# Patient Record
Sex: Female | Born: 1970 | Race: Black or African American | Hispanic: No | Marital: Single | Smoking: Current every day smoker
Health system: Southern US, Community
[De-identification: ages and names within clinical notes are randomized; demographics above are authoritative.]

## PROBLEM LIST (undated history)

## (undated) DIAGNOSIS — E119 Type 2 diabetes mellitus without complications: Secondary | ICD-10-CM

## (undated) HISTORY — PX: OTHER SURGICAL HISTORY: SHX169

---

## 2015-10-13 ENCOUNTER — Emergency Department (HOSPITAL_COMMUNITY): Payer: Medicaid Other

## 2015-10-13 ENCOUNTER — Emergency Department (HOSPITAL_COMMUNITY)
Admission: EM | Admit: 2015-10-13 | Discharge: 2015-10-13 | Disposition: A | Discharge status: Home / Self Care | Payer: Medicaid Other | Attending: Emergency Medicine | Admitting: Emergency Medicine

## 2015-10-13 ENCOUNTER — Encounter (HOSPITAL_COMMUNITY): Payer: Self-pay | Admitting: *Deleted

## 2015-10-13 DIAGNOSIS — E119 Type 2 diabetes mellitus without complications: Secondary | ICD-10-CM | POA: Insufficient documentation

## 2015-10-13 DIAGNOSIS — F17200 Nicotine dependence, unspecified, uncomplicated: Secondary | ICD-10-CM | POA: Insufficient documentation

## 2015-10-13 DIAGNOSIS — R079 Chest pain, unspecified: Secondary | ICD-10-CM | POA: Diagnosis present

## 2015-10-13 HISTORY — DX: Type 2 diabetes mellitus without complications: E11.9

## 2015-10-13 LAB — CBC
HCT: 36.7 % (ref 36.0–46.0)
Hemoglobin: 12 g/dL (ref 12.0–15.0)
MCH: 29.2 pg (ref 26.0–34.0)
MCHC: 32.7 g/dL (ref 30.0–36.0)
MCV: 89.3 fL (ref 78.0–100.0)
PLATELETS: 246 10*3/uL (ref 150–400)
RBC: 4.11 MIL/uL (ref 3.87–5.11)
RDW: 15.7 % — AB (ref 11.5–15.5)
WBC: 12.9 10*3/uL — ABNORMAL HIGH (ref 4.0–10.5)

## 2015-10-13 LAB — I-STAT TROPONIN, ED
TROPONIN I, POC: 0 ng/mL (ref 0.00–0.08)
Troponin i, poc: 0 ng/mL (ref 0.00–0.08)

## 2015-10-13 LAB — BASIC METABOLIC PANEL
Anion gap: 5 (ref 5–15)
BUN: 8 mg/dL (ref 6–20)
CALCIUM: 8.8 mg/dL — AB (ref 8.9–10.3)
CO2: 26 mmol/L (ref 22–32)
CREATININE: 0.86 mg/dL (ref 0.44–1.00)
Chloride: 105 mmol/L (ref 101–111)
GFR calc Af Amer: >60 mL/min (ref >60)
GLUCOSE: 94 mg/dL (ref 65–99)
POTASSIUM: 3.5 mmol/L (ref 3.5–5.1)
SODIUM: 136 mmol/L (ref 135–145)

## 2015-10-13 MED ORDER — ALBUTEROL SULFATE HFA 108 (90 BASE) MCG/ACT IN AERS
1.0000 | INHALATION_SPRAY | Freq: Four times a day (QID) | RESPIRATORY_TRACT | Status: DC | PRN
  Administered 2015-10-13: 2 via RESPIRATORY_TRACT
  Filled 2015-10-13: qty 6.7

## 2015-10-13 MED ORDER — ASPIRIN 81 MG PO CHEW
324.0000 mg | CHEWABLE_TABLET | Freq: Once | ORAL | Status: AC
  Administered 2015-10-13: 324 mg via ORAL
  Filled 2015-10-13: qty 4

## 2015-10-13 MED ORDER — GI COCKTAIL ~~LOC~~
30.0000 mL | Freq: Once | ORAL | Status: AC
  Administered 2015-10-13: 30 mL via ORAL
  Filled 2015-10-13: qty 30

## 2015-10-13 NOTE — ED Provider Notes (Signed)
[facility]-EMERGENCY DEPT Provider Note   CSN: [id number] Arrival date & time: 10/13/15  1845     History   Chief Complaint Chief Complaint  Patient presents with  . Chest Pain  . Abdominal Pain    HPI Catherine Bender is a 45 y.o. female.  Pt started having pain in her chest while she was getting dinner at her group home this evening.  The foot was too salty and she it upset her stomach.  Pt is also upset about her treatment by the person at the group home.  She also is upset because they took her asthma inhaler    Chest Pain   This is a new problem. The current episode started less than 1 hour ago. The problem occurs constantly. The pain is mild. The quality of the pain is described as stabbing. The pain does not radiate. Associated symptoms include nausea. Pertinent negatives include no abdominal pain, no fever, no shortness of breath and no vomiting. She has tried nothing for the symptoms. The treatment provided no relief. Risk factors include smoking/tobacco exposure.  Pertinent negatives for past medical history include no DVT, no hypertension, no MI and no PE. Past medical history comments: Pt states she use to have diabetes ubut not any longer    Past Medical History:  Diagnosis Date  . Diabetes mellitus without complication (HCC)     There are no active problems to display for this patient.   Past Surgical History:  Procedure Laterality Date  . umbical hernia repair      OB History    No data available       Home Medications    Prior to Admission medications   Not on File    Family History No family history on file.  Social History Social History  Substance Use Topics  . Smoking status: Current Every Day Smoker    Packs/day: 0.25  . Smokeless tobacco: Never Used  . Alcohol use No     Allergies   Morphine and related   Review of Systems Review of Systems  Constitutional: Negative for fever.  Respiratory: Negative for shortness of breath.     Cardiovascular: Positive for chest pain.  Gastrointestinal: Positive for nausea. Negative for abdominal pain and vomiting.  All other systems reviewed and are negative.    Physical Exam Updated Vital Signs BP 138/94 (BP Location: Right Arm)   Pulse (!) 52   Temp 98.3 F (36.8 C) (Oral)   Resp 18   Ht 5\' 7"  (1.702 m)   Wt 79.4 kg   LMP 09/20/2015   SpO2 100%   BMI 27.41 kg/m   Physical Exam  Constitutional: She appears well-developed and well-nourished. No distress.  HENT:  Head: Normocephalic and atraumatic.  Right Ear: External ear normal.  Left Ear: External ear normal.  Eyes: Conjunctivae are normal. Right eye exhibits no discharge. Left eye exhibits no discharge. No scleral icterus.  Neck: Neck supple. No tracheal deviation present.  Cardiovascular: Normal rate, regular rhythm and intact distal pulses.   Pulmonary/Chest: Effort normal and breath sounds normal. No stridor. No respiratory distress. She has no wheezes. She has no rales.  Abdominal: Soft. Bowel sounds are normal. She exhibits no distension. There is no tenderness. There is no rebound and no guarding.  Musculoskeletal: She exhibits no edema or tenderness.  Neurological: She is alert. She has normal strength. No cranial nerve deficit (no facial droop, extraocular movements intact, no slurred speech) or sensory deficit. She exhibits normal muscle  tone. She displays no seizure activity. Coordination normal.  Skin: Skin is warm and dry. No rash noted. She is not diaphoretic.  Psychiatric: She has a normal mood and affect.  Nursing note and vitals reviewed.    ED Treatments / Results  Labs (all labs ordered are listed, but only abnormal results are displayed) Labs Reviewed  CBC - Abnormal; Notable for the following:       Result Value   WBC 12.9 (*)    RDW 15.7 (*)    All other components within normal limits  BASIC METABOLIC PANEL - Abnormal; Notable for the following:    Calcium 8.8 (*)    All other  components within normal limits  I-STAT TROPOININ, ED  I-STAT TROPOININ, ED    EKG  EKG Interpretation  Date/Time:  Sunday October 13 2015 19:12:16 EDT Ventricular Rate:  65 PR Interval:    QRS Duration: 90 QT Interval:  443 QTC Calculation: 461 R Axis:   40 Text Interpretation:  Sinus rhythm Borderline T wave abnormalities No old tracing to compare Confirmed by Raissa Dam  MD-J, Jaaziah Schulke [phone]) on 10/13/2015 7:27:32 PM       Radiology Dg Chest Portable 1 View  Result Date: 10/13/2015 CLINICAL DATA:  45 year old female with chest pain. EXAM: PORTABLE CHEST 1 VIEW COMPARISON:  None. FINDINGS: Single portable view of the chest demonstrates clear lungs with sharp costophrenic angles. There is no pneumothorax. Top-normal cardiac size. No acute osseous pathology. IMPRESSION: No active disease. Electronically Signed   By: Elgie Collard M.D.   On: 10/13/2015 20:22    Procedures Procedures (including critical care time)  Medications Ordered in ED Medications  albuterol (PROVENTIL HFA;VENTOLIN HFA) 108 (90 Base) MCG/ACT inhaler 1-2 puff (not administered)  aspirin chewable tablet 324 mg (324 mg Oral Given 10/13/15 1945)  gi cocktail (Maalox,Lidocaine,Donnatal) (30 mLs Oral Given 10/13/15 1946)     Initial Impression / Assessment and Plan / ED Course  I have reviewed the triage vital signs and the nursing notes.  Pertinent labs & imaging results that were available during my care of the patient were reviewed by me and considered in my medical decision making (see chart for details).  Clinical Course   I suspect the patient's symptoms are most likely related to her stress this evening. She was upset about not getting the food that she wanted. EKG and serial cardiac enzymes are reassuring. Chest x-ray and other laboratory tests are reassuring. The patient requested an inhaler because the staff at her facility threw away her other one.  Patient's discharge home in stable condition. She is feeling  better and wants to leave.  Final Clinical Impressions(s) / ED Diagnoses   Final diagnoses:  Chest pain, unspecified chest pain type    New Prescriptions New Prescriptions   No medications on file     Linwood Dibbles, MD 10/13/15 2301

## 2015-10-13 NOTE — Discharge Instructions (Signed)
Continue your current medications  Follow up with your doctor

## 2015-10-13 NOTE — ED Triage Notes (Signed)
Pt here via [facility] for chest pain.  EMS states that pt was at her group home and became angry b/c the food had too much salt on the food and she began having chest pain.  Pain increases with palpation.  EKG unremarkable.

## 2016-01-05 ENCOUNTER — Encounter (HOSPITAL_COMMUNITY): Payer: Self-pay | Admitting: *Deleted

## 2016-01-05 ENCOUNTER — Emergency Department (HOSPITAL_COMMUNITY)
Admission: EM | Admit: 2016-01-05 | Discharge: 2016-01-06 | Disposition: A | Discharge status: Other Acute Inpatient Hospital | Payer: Medicaid Other | Attending: Emergency Medicine | Admitting: Emergency Medicine

## 2016-01-05 DIAGNOSIS — Z046 Encounter for general psychiatric examination, requested by authority: Secondary | ICD-10-CM | POA: Diagnosis present

## 2016-01-05 DIAGNOSIS — E119 Type 2 diabetes mellitus without complications: Secondary | ICD-10-CM | POA: Diagnosis not present

## 2016-01-05 DIAGNOSIS — F809 Developmental disorder of speech and language, unspecified: Secondary | ICD-10-CM | POA: Diagnosis not present

## 2016-01-05 DIAGNOSIS — Z79899 Other long term (current) drug therapy: Secondary | ICD-10-CM | POA: Diagnosis not present

## 2016-01-05 DIAGNOSIS — F3164 Bipolar disorder, current episode mixed, severe, with psychotic features: Secondary | ICD-10-CM | POA: Diagnosis not present

## 2016-01-05 DIAGNOSIS — F17200 Nicotine dependence, unspecified, uncomplicated: Secondary | ICD-10-CM | POA: Diagnosis not present

## 2016-01-05 DIAGNOSIS — N898 Other specified noninflammatory disorders of vagina: Secondary | ICD-10-CM | POA: Diagnosis not present

## 2016-01-05 DIAGNOSIS — F3160 Bipolar disorder, current episode mixed, unspecified: Secondary | ICD-10-CM | POA: Diagnosis present

## 2016-01-05 DIAGNOSIS — R4789 Other speech disturbances: Secondary | ICD-10-CM

## 2016-01-05 LAB — RAPID URINE DRUG SCREEN, HOSP PERFORMED
AMPHETAMINES: NOT DETECTED
BENZODIAZEPINES: NOT DETECTED
Barbiturates: NOT DETECTED
COCAINE: NOT DETECTED
Opiates: NOT DETECTED
Tetrahydrocannabinol: NOT DETECTED

## 2016-01-05 LAB — URINALYSIS, ROUTINE W REFLEX MICROSCOPIC
Bilirubin Urine: NEGATIVE
Glucose, UA: NEGATIVE mg/dL
Hgb urine dipstick: NEGATIVE
Ketones, ur: NEGATIVE mg/dL
NITRITE: NEGATIVE
PH: 5.5 (ref 5.0–8.0)
Protein, ur: NEGATIVE mg/dL
SPECIFIC GRAVITY, URINE: 1.007 (ref 1.005–1.030)

## 2016-01-05 LAB — CBC WITH DIFFERENTIAL/PLATELET
BASOS ABS: 0 10*3/uL (ref 0.0–0.1)
BASOS PCT: 0 %
EOS PCT: 1 %
Eosinophils Absolute: 0.1 10*3/uL (ref 0.0–0.7)
HCT: 36.3 % (ref 36.0–46.0)
Hemoglobin: 12.6 g/dL (ref 12.0–15.0)
Lymphocytes Relative: 28 %
Lymphs Abs: 3.5 10*3/uL (ref 0.7–4.0)
MCH: 29.4 pg (ref 26.0–34.0)
MCHC: 34.7 g/dL (ref 30.0–36.0)
MCV: 84.8 fL (ref 78.0–100.0)
MONO ABS: 0.9 10*3/uL (ref 0.1–1.0)
Monocytes Relative: 7 %
NEUTROS ABS: 8 10*3/uL — AB (ref 1.7–7.7)
Neutrophils Relative %: 64 %
PLATELETS: 302 10*3/uL (ref 150–400)
RBC: 4.28 MIL/uL (ref 3.87–5.11)
RDW: 14.4 % (ref 11.5–15.5)
WBC: 12.6 10*3/uL — AB (ref 4.0–10.5)

## 2016-01-05 LAB — COMPREHENSIVE METABOLIC PANEL
ALT: 15 U/L (ref 14–54)
AST: 25 U/L (ref 15–41)
Albumin: 3.5 g/dL (ref 3.5–5.0)
Alkaline Phosphatase: 58 U/L (ref 38–126)
Anion gap: 10 (ref 5–15)
BILIRUBIN TOTAL: 0.8 mg/dL (ref 0.3–1.2)
BUN: 12 mg/dL (ref 6–20)
CHLORIDE: 102 mmol/L (ref 101–111)
CO2: 24 mmol/L (ref 22–32)
Calcium: 8.7 mg/dL — ABNORMAL LOW (ref 8.9–10.3)
Creatinine, Ser: 1.28 mg/dL — ABNORMAL HIGH (ref 0.44–1.00)
GFR, EST AFRICAN AMERICAN: 58 mL/min — AB (ref >60)
GFR, EST NON AFRICAN AMERICAN: 50 mL/min — AB (ref >60)
Glucose, Bld: 96 mg/dL (ref 65–99)
POTASSIUM: 3.3 mmol/L — AB (ref 3.5–5.1)
Sodium: 136 mmol/L (ref 135–145)
TOTAL PROTEIN: 6.8 g/dL (ref 6.5–8.1)

## 2016-01-05 LAB — URINE MICROSCOPIC-ADD ON
BACTERIA UA: NONE SEEN
RBC / HPF: NONE SEEN RBC/hpf (ref 0–5)

## 2016-01-05 LAB — ACETAMINOPHEN LEVEL: Acetaminophen (Tylenol), Serum: <10 ug/mL — ABNORMAL LOW (ref 10–30)

## 2016-01-05 LAB — HCG, QUANTITATIVE, PREGNANCY: hCG, Beta Chain, Quant, S: <1 m[IU]/mL (ref <5)

## 2016-01-05 LAB — SALICYLATE LEVEL: Salicylate Lvl: <7 mg/dL (ref 2.8–30.0)

## 2016-01-05 LAB — ETHANOL

## 2016-01-05 MED ORDER — ZIPRASIDONE MESYLATE 20 MG IM SOLR
20.0000 mg | Freq: Once | INTRAMUSCULAR | Status: AC
  Administered 2016-01-05: 20 mg via INTRAMUSCULAR
  Filled 2016-01-05: qty 20

## 2016-01-05 MED ORDER — ZIPRASIDONE MESYLATE 20 MG IM SOLR
20.0000 mg | Freq: Once | INTRAMUSCULAR | Status: AC
  Administered 2016-01-06: 20 mg via INTRAMUSCULAR
  Filled 2016-01-05: qty 20

## 2016-01-05 MED ORDER — SODIUM CHLORIDE 0.9 % IV BOLUS (SEPSIS): 1000.0000 mL | Freq: Once | INTRAVENOUS | Status: DC

## 2016-01-05 MED ORDER — STERILE WATER FOR INJECTION IJ SOLN
INTRAMUSCULAR | Status: AC
  Administered 2016-01-05: 12:00:00
  Filled 2016-01-05: qty 10

## 2016-01-05 MED ORDER — LORAZEPAM 2 MG/ML IJ SOLN
2.0000 mg | Freq: Once | INTRAMUSCULAR | Status: AC
  Administered 2016-01-05: 2 mg via INTRAMUSCULAR
  Filled 2016-01-05: qty 1

## 2016-01-05 MED ORDER — CLONIDINE HCL 0.1 MG PO TABS: 0.2000 mg | ORAL_TABLET | Freq: Once | ORAL | Status: DC

## 2016-01-05 MED ORDER — DIPHENHYDRAMINE HCL 50 MG/ML IJ SOLN
50.0000 mg | Freq: Once | INTRAMUSCULAR | Status: AC
  Administered 2016-01-05: 50 mg via INTRAMUSCULAR
  Filled 2016-01-05: qty 1

## 2016-01-05 MED ORDER — POTASSIUM CHLORIDE CRYS ER 20 MEQ PO TBCR
40.0000 meq | EXTENDED_RELEASE_TABLET | Freq: Once | ORAL | Status: AC
  Administered 2016-01-05: 40 meq via ORAL
  Filled 2016-01-05 (×2): qty 2

## 2016-01-05 MED ORDER — DIPHENHYDRAMINE HCL 50 MG/ML IJ SOLN
50.0000 mg | Freq: Once | INTRAMUSCULAR | Status: AC
  Administered 2016-01-06: 50 mg via INTRAMUSCULAR
  Filled 2016-01-05: qty 1

## 2016-01-05 NOTE — ED Notes (Signed)
Calmer, still tearful, watching tv, talking about her granddaughter.  Pt agreed to take medication.

## 2016-01-05 NOTE — Progress Notes (Signed)
Patient has been accepted to [facility].  Accepting physician is Dr. Margarita RanaSurya Bender Call report to [phone].  Representative was [facility].  [facility], LCAS-A, LPC-A, [facility]  Counselor 01/05/2016 7:20 PM

## 2016-01-05 NOTE — ED Notes (Addendum)
Marijean NiemannJaime, [place] at bedside,

## 2016-01-05 NOTE — ED Notes (Signed)
Patient noted sleeping in room. No complaints, stable, in no acute distress. Q15 minute rounds and monitoring via Security Cameras to continue.  

## 2016-01-05 NOTE — ED Notes (Signed)
Patient refuses to keep her voice down even though she is made aware of the irritability of other patients.

## 2016-01-05 NOTE — ED Notes (Signed)
Pt. Not responding to redirection related to her disturbance of the milieu by loud commentary in the hall.

## 2016-01-05 NOTE — ED Notes (Signed)
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  Pt then began talking about her family putting her places to get her help.

## 2016-01-05 NOTE — Progress Notes (Signed)
TTS assessment recommends inpatient treatment for pt.  Pt under IVC.   [facility] [place]  Noted UDS results are pending.   Ilean SkillMeghan Josanna Hefel, MSW, LCSW Clinical Social Work, Disposition  01/05/2016 [phone]

## 2016-01-05 NOTE — ED Notes (Signed)
Pt in the bathroom talking loudly

## 2016-01-05 NOTE — ED Notes (Signed)
Patient denies pain and is resting comfortably.  

## 2016-01-05 NOTE — ED Notes (Signed)
Pt up in hall talking, pt agrees take potassium

## 2016-01-05 NOTE — ED Notes (Signed)
Patient refuses to put pants on.  She became anxious and started shouting.  Patient finally put pants on after speaking with her for some time.

## 2016-01-05 NOTE — ED Notes (Signed)
Pt medicated w/o difficulty. Talking loudly, rambling about her grand children, people stealing from her.  PT acknowledged that she does live in a group home, but does not want to return.  Pt reports that she has walking all over town and that she can stay at the [facility].  Pt reassured that she is safe here.

## 2016-01-05 NOTE — ED Notes (Addendum)
Nad, watching tv.  Pt continues to report that she had a purse with her.  Unable to locate in belongings or TCU, ED charge contacted and will look in main ED. Tearful and loud at times

## 2016-01-05 NOTE — ED Notes (Signed)
Additional belongings located in main ed

## 2016-01-05 NOTE — ED Notes (Signed)
Mother asked for a call with disposition at [phone]. Pt. Gives permission to talk to Mom.

## 2016-01-05 NOTE — ED Notes (Signed)
Add pregnancy test.

## 2016-01-05 NOTE — ED Notes (Signed)
Patient noted in hall still hyperverbal. No complaints, stable, in no acute distress. Q15 minute rounds and monitoring via [employer] Cameras to continue.

## 2016-01-05 NOTE — ED Notes (Signed)
Up walking int he hall, smiling laughing talking about wifi and cell phone towers.  PT is aware that we need a urine sample

## 2016-01-05 NOTE — ED Notes (Addendum)
Pt laying in room crying, yelling, "talking to God" unable to redirect reports that she's "praying" and not to interrupt her.  Pt reports that she just wants to" go to her daughters"..  home and "be mute" Pt yelling about her purse, is aware that she purse was located, IVC in progress.

## 2016-01-05 NOTE — ED Notes (Addendum)
Up to the bathroom to shower and change scrubs. After arriving pt left her room and began to cry asking if she could take a shower "have been waiting for 24 hours."

## 2016-01-05 NOTE — ED Notes (Signed)
Message left with [facility] for transport Monday AM.

## 2016-01-05 NOTE — ED Notes (Addendum)
Called [facility] to receive patient. Patient moved to room 37

## 2016-01-05 NOTE — ED Notes (Signed)
Pt. Still manic pacing in hall talking loudly despite requests to reduce her volume.

## 2016-01-05 NOTE — ED Notes (Signed)
Pt. Awake yelling loudly about leaving the hospital.

## 2016-01-05 NOTE — ED Notes (Signed)
Pt to be IVC'd per Dr JShela Commons

## 2016-01-05 NOTE — ED Notes (Signed)
Report to include Situation, Background, Assessment, and Recommendations received from Janie Rambo RN. Patient alert and oriented, warm and dry, in no acute distress. Patient denies SI, HI, AVH and pain. Patient made aware of Q15 minute rounds and security cameras for their safety. Patient instructed to come to me with needs or concerns.  

## 2016-01-05 NOTE — ED Notes (Signed)
Bed: WA30 Expected date:  Expected time:  Means of arrival:  Comments: 

## 2016-01-05 NOTE — Progress Notes (Signed)
Referral submitted to:  [facility], [place], [place], [address], [place], [place], [place], [place], [place], [place], [place], [address] [place], [address], [address], [place], [place], [place], [place], and [facility]  Eldo Umanzor K. Sherlon HandingHarris, LCAS-A, LPC-A, [facility]  Counselor 01/05/2016 5:58 PM

## 2016-01-05 NOTE — ED Notes (Addendum)
Pt ambulatory w/o difficulty from TCU .  Pt wanded by security prior to arrival

## 2016-01-05 NOTE — ED Provider Notes (Signed)
[facility]-EMERGENCY DEPT Provider Note   CSN: [id number] Arrival date & time: 01/05/16  0606     History   Chief Complaint Chief Complaint  Patient presents with  . Psychiatric Evaluation    HPI Catherine Bender is a 45 y.o. female.  The history is provided by the patient and medical records. No language interpreter was used.     Catherine Bender is a 45 y.o. female  with a PMH of DM who presents to the Emergency Department by EMS for concern for patient's welfare. Per [employer], patient was stating she was pregnant and had amniotic fluid leaking. When I went to evaluate the patient, I asked her why she was in the ER today and she stated:  "I don't care to discuss that. I'm a Geneticist, molecularlegal lawyer. I'm Catherine Bender's cousin and don't care to talk" I asked her if there was anything that I could do to help her and she stated: "No I help myself, thank you"   I went to re-evaluate patient and she was more cooperative. She denies SI/HI. She does state that she had amniotic fluid leaking starting early this morning. She knew it was amniotic fluid because of the way it smelled. She states that she was told she couldn't be pregnant anymore and is not pregnant now. When asked what medications she takes, she says lithium only as needed but that "the Shaune PollackLord talks to people and he told me not to take that medicine. He said I don't need that." She denies SI/HI at this time. Denies any visual hallucinations.   Past Medical History:  Diagnosis Date  . Diabetes mellitus without complication (HCC)     There are no active problems to display for this patient.   Past Surgical History:  Procedure Laterality Date  . umbical hernia repair      OB History    No data available       Home Medications    Prior to Admission medications   Medication Sig Start Date End Date Taking? Authorizing Provider  LITHIUM PO Take 1 tablet by mouth once.   Yes Historical Provider, MD    Family History History reviewed. No  pertinent family history.  Social History Social History  Substance Use Topics  . Smoking status: Current Every Day Smoker    Packs/day: 0.25  . Smokeless tobacco: Never Used  . Alcohol use No     Allergies   Morphine and related   Review of Systems Review of Systems  Unable to perform ROS: Psychiatric disorder  Gastrointestinal: Negative for abdominal pain.     Physical Exam Updated Vital Signs BP (!) 146/108 (BP Location: Right Arm)   Pulse 78   Temp 98.2 F (36.8 C) (Oral)   Resp 20   Ht 5\' 5"  (1.651 m)   Wt 79.4 kg   SpO2 100%   BMI 29.12 kg/m   Physical Exam  Constitutional: She is oriented to person, place, and time.  WDWN female covering her face with bedsheet.   HENT:  Head: Normocephalic and atraumatic.  Cardiovascular: Normal rate, regular rhythm and normal heart sounds.   No murmur heard. Pulmonary/Chest: Effort normal and breath sounds normal. No respiratory distress.  Abdominal:  Patient would not let me touch abdomen, therefore abdominal exam deferred at this time.   Musculoskeletal:  Moves all extremities well.  Neurological: She is alert and oriented to person, place, and time.  Skin: Skin is warm and dry.  Nursing note and vitals reviewed.  ED Treatments / Results  Labs (all labs ordered are listed, but only abnormal results are displayed) Labs Reviewed  COMPREHENSIVE METABOLIC PANEL - Abnormal; Notable for the following:       Result Value   Potassium 3.3 (*)    Creatinine, Ser 1.28 (*)    Calcium 8.7 (*)    GFR calc non Af Amer 50 (*)    GFR calc Af Amer 58 (*)    All other components within normal limits  CBC WITH DIFFERENTIAL/PLATELET - Abnormal; Notable for the following:    WBC 12.6 (*)    Neutro Abs 8.0 (*)    All other components within normal limits  ACETAMINOPHEN LEVEL - Abnormal; Notable for the following:    Acetaminophen (Tylenol), Serum <10 (*)    All other components within normal limits  ETHANOL  SALICYLATE  LEVEL  HCG, QUANTITATIVE, PREGNANCY  RAPID URINE DRUG SCREEN, HOSP PERFORMED  URINALYSIS, ROUTINE W REFLEX MICROSCOPIC (NOT AT [facility])    EKG  EKG Interpretation None       Radiology No results found.  Procedures Procedures (including critical care time)  Medications Ordered in ED Medications  potassium chloride SA (K-DUR,KLOR-CON) CR tablet 40 mEq (40 mEq Oral Refused 01/05/16 0945)  cloNIDine (CATAPRES) tablet 0.2 mg (not administered)     Initial Impression / Assessment and Plan / ED Course  I have reviewed the triage vital signs and the nursing notes.  Pertinent labs & imaging results that were available during my care of the patient were reviewed by me and considered in my medical decision making (see chart for details).  Clinical Course    [name] is a 45 y.o. female who presents to ED voluntarily however with [employer] for psychiatric evaluation. On exam, patient is very non-cooperative, hyperverbal with decreased eye contact. She does state that she was suppose to be on lithium but is not compliant. Chart review does not show known psychiatric illness but this is very likely. Pregnancy test negative. Labs reviewed and show creatinine of 1.28. Initially placed 1L fluid bolus however patient is not cooperative enough for IV. Went to re-evaluate patient and she states that she has not had any water in over a week because there is "glass in all the water". Water bottle provided to patient and nursing staff informed to encourage hydration. Lab abnormality likely 2/2 dehydration. She still will not allow me to do abdominal exam.   Medically cleared and dispo per TTS with likely inpatient care.    Final Clinical Impressions(s) / ED Diagnoses   Final diagnoses:  None    New Prescriptions New Prescriptions   No medications on file     [facility] Center[facility]ime Pilcher Keelynn Furgerson, PA-C 01/05/16 [id number]    Lorre NickAnthony Allen, MD 01/05/16 [id number]

## 2016-01-05 NOTE — Progress Notes (Signed)
CSW faxed patient's IVC paperwork to magistrate office. CSW contacted to magistrate office to ensure paperwork was received. Staff reported that paperwork was received and that the magistrate today is Lovell SheehanJenkins. CSW logged IVC and first examination paperwork into IVC log book.

## 2016-01-05 NOTE — ED Triage Notes (Signed)
Patient is alert with confusion.  EMS called by [employer] due to patient well fare.  Patient is stating to EMS that she was pregnant and her amniotic fluid was leaking.  During the encounter with EMS patient made statements as she is michael jackson's cousin and multiple other statements that made no sense and patient was brought in for possible psych issues.

## 2016-01-05 NOTE — BH Assessment (Signed)
Tele Assessment Note *PT'S LABS NOT COMPLETED AT TIME OF TELEASSESSMENT* Pt presents voluntarily to [facility]. Per chart review, EMS was called by [employer] to do welfare check. Pt BIB EMS. Pt is delusional. She states that she is Casimiro Needle Jackson's cousin. 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 Pt denies SI currently or at any time in the past. Pt denies any history of suicide attempts, or of self-mutilation. Pt denies any current or past substance abuse problems. Pt does not appear to be intoxicated or in withdrawal at this time. Pt denies homicidal thoughts or physical aggression. Pt denies having any legal problems at this time. Pt is at first cooperative with Clinical research associate but then pt becomes irritable. Her thought process is tangential and circumstantial. Pt reports her amniotic sac is leaking. When asked about pregnancy, pt sts she doesn't want any family members to know, and she begins naming several relatives. Pt is hyper verbal. She reports she has been inpatient at a psychiatric hospital, but she doesn't report name or date of admission. Pt denies current outpatient treatment. When asked about pt's appetite, pt says, "I want to eat healthy so you won't die." She then states, "I'm done" several times and refuses to speak anymore to Clinical research associate.  Writer called and spoke w [employer] Medical laboratory scientific officer. Simpson. Officer Lodema Hong reports pt told responding officers that a "cartoon spoke to her and told her something's wrong."   Shirah Eberwein is an 45 y.o. female.   Diagnosis: Unspecified Schizophrenia Spectrum or other Psychotic Disorder  Past Medical History:  Past Medical History:  Diagnosis Date  . Diabetes mellitus without complication [facility])     Past Surgical History:  Procedure Laterality Date  . umbical hernia repair      Family History: History reviewed. No pertinent family history.  Social History:  reports that she has been smoking.  She has been smoking about 0.25 packs per day. She has never  used smokeless tobacco. She reports that she does not drink alcohol or use drugs.  Additional Social History:  Alcohol / Drug Use Pain Medications: pt denies abuse - see pta meds list Prescriptions: pt denies abuse - see pta meds list Over the Counter: pt denies abuse - see pta meds list History of alcohol / drug use?: No history of alcohol / drug abuse Longest period of sobriety (when/how long): n/a  CIWA: CIWA-Ar BP: (!) 146/108 Pulse Rate: 78 COWS:    PATIENT STRENGTHS: (choose at least two) Average or above average intelligence Communication skills  Allergies:  Allergies  Allergen Reactions  . Morphine And Related     Home Medications:  (Not in a hospital admission)  OB/GYN Status:  No LMP recorded.  General Assessment Data Location of Assessment: [facility] ED TTS Assessment: Out of system Is this a Tele or Face-to-Face Assessment?: Tele Assessment Is this an Initial Assessment or a Re-assessment for this encounter?: Initial Assessment Marital status:  (unable to assess) Is patient pregnant?: Other (Comment) (labs not back yet) Pregnancy Status: Other (Comment) Living Arrangements:  (boarding house) Can pt return to current living arrangement?: Yes Admission Status: Voluntary Is patient capable of signing voluntary admission?: No Referral Source: Other Insurance type: medicaid     Crisis Care Plan Living Arrangements:  (boarding house) Name of Psychiatrist: none Name of Therapist: none  Education Status Is patient currently in school?: No  Risk to self with the past 6 months Suicidal Ideation: No Has patient been a risk to self within the past 6  months prior to admission? : No Suicidal Intent: No Has patient had any suicidal intent within the past 6 months prior to admission? : No Is patient at risk for suicide?: No Suicidal Plan?: No Has patient had any suicidal plan within the past 6 months prior to admission? : No Access to Means:  (n/a) What has been  your use of drugs/alcohol within the last 12 months?: pt denies use Previous Attempts/Gestures: No How many times?: 0 Other Self Harm Risks: none Triggers for Past Attempts:  (n/a) Intentional Self Injurious Behavior: None Family Suicide History: Unable to assess Recent stressful life event(s):  (unable to assess) Persecutory voices/beliefs?: No Depression:  (unable to assess) Depression Symptoms:  (unable to assess) Substance abuse history and/or treatment for substance abuse?: No Suicide prevention information given to non-admitted patients: Not applicable  Risk to Others within the past 6 months Homicidal Ideation: No Does patient have any lifetime risk of violence toward others beyond the six months prior to admission? : No Thoughts of Harm to Others: No Current Homicidal Intent: No Current Homicidal Plan: No Access to Homicidal Means: No Identified Victim: none History of harm to others?:  (unable to assess) Assessment of Violence: None Noted Violent Behavior Description: unable to assess Does patient have access to weapons?:  (unable to assess) Criminal Charges Pending?: No Does patient have a court date: No Is patient on probation?: Unknown  Psychosis Hallucinations:  (unable to assess) Delusions:  (pt sts she is Medical sales representativemichael jackson's cousin, an attorney & shrink)  Mental Status Report Appearance/Hygiene: Unremarkable, In scrubs (pt has face mask pulled down under her chin) Eye Contact: Fair Motor Activity: Freedom of movement Speech: Logical/coherent, Tangential Level of Consciousness: Alert, Irritable Mood:  (unable to assess) Affect: Labile Anxiety Level: None Thought Processes: Tangential, Circumstantial, Relevant Judgement: Impaired Orientation: Person Obsessive Compulsive Thoughts/Behaviors: Unable to Assess  Cognitive Functioning Concentration: Unable to Assess Memory: Unable to Assess IQ: Average Insight: Poor Impulse Control: Poor Appetite:  (unable  to assess) Sleep: Unable to Assess Vegetative Symptoms: Unable to Assess  ADLScreening [facility] Assessment Services) Patient's cognitive ability adequate to safely complete daily activities?: No Patient able to express need for assistance with ADLs?: Yes Independently performs ADLs?: Yes (appropriate for developmental age)  Prior Inpatient Therapy Prior Inpatient Therapy: Yes Prior Therapy Dates: unknown Prior Therapy Facilty/Provider(s): unknown Reason for Treatment: unknown  Prior Outpatient Therapy Prior Outpatient Therapy: Yes Prior Therapy Dates: unable to assess Prior Therapy Facilty/Provider(s): unable to assess Reason for Treatment: unable to assess Does patient have an ACCT team?: Unknown Does patient have Intensive In-House Services?  : No Does patient have [facility] services? : Unknown Does patient have P4CC services?: Unknown  ADL Screening (condition at time of admission) Patient's cognitive ability adequate to safely complete daily activities?: No Is the patient deaf or have difficulty hearing?: No Does the patient have difficulty seeing, even when wearing glasses/contacts?: No Does the patient have difficulty concentrating, remembering, or making decisions?: Yes Patient able to express need for assistance with ADLs?: Yes Does the patient have difficulty dressing or bathing?: No Independently performs ADLs?: Yes (appropriate for developmental age) Does the patient have difficulty walking or climbing stairs?: No Weakness of Legs: None Weakness of Arms/Hands: None  Home Assistive Devices/Equipment Home Assistive Devices/Equipment: None    Abuse/Neglect Assessment (Assessment to be complete while patient is alone) Physical Abuse:  (unable to assess) Verbal Abuse:  (unable to assess) Sexual Abuse:  (unable to assess) Exploitation of patient/patient's resources:  (unable to assess)  Self-Neglect:  (unable to assess)     Advance Directives (For Healthcare) Does  Patient Have a Medical Advance Directive?: No Would patient like information on creating a medical advance directive?: No - Patient declined    Additional Information 1:1 In Past 12 Months?: No CIRT Risk: No Elopement Risk: Yes Does patient have medical clearance?: Yes     Disposition:  Disposition Initial Assessment Completed for this Encounter: Yes Disposition of Patient: Inpatient treatment program Catha Nottingham(jamison lord DNP rec inpatient treatment)  Darus Hershman P 01/05/2016 9:15 AM

## 2016-01-05 NOTE — ED Notes (Signed)
Patient noted in hall. No complaints, stable, in no acute distress. Q15 minute rounds and monitoring via Security Cameras to continue. 

## 2016-01-06 MED ORDER — STERILE WATER FOR INJECTION IJ SOLN
INTRAMUSCULAR | Status: AC
  Administered 2016-01-06: 1 mL
  Filled 2016-01-06: qty 10

## 2016-01-06 MED ORDER — ASENAPINE MALEATE 5 MG SL SUBL
10.0000 mg | SUBLINGUAL_TABLET | Freq: Two times a day (BID) | SUBLINGUAL | Status: DC
  Administered 2016-01-06: 10 mg via SUBLINGUAL
  Filled 2016-01-06: qty 2

## 2016-01-06 MED ORDER — LORAZEPAM 1 MG PO TABS
2.0000 mg | ORAL_TABLET | Freq: Once | ORAL | Status: AC
  Administered 2016-01-06: 2 mg via ORAL
  Filled 2016-01-06: qty 2

## 2016-01-06 MED ORDER — LORAZEPAM 1 MG PO TABS: 1.0000 mg | ORAL_TABLET | ORAL | Status: DC | PRN

## 2016-01-06 NOTE — ED Notes (Signed)
Patient noted sleeping in room. No complaints, stable, in no acute distress. Q15 minute rounds and monitoring via Security Cameras to continue.  

## 2016-01-06 NOTE — ED Notes (Signed)
Pt. C/o anxiety and crying.

## 2016-01-06 NOTE — ED Notes (Signed)
[facility].  [place]  [place] [facility]  All belongings given to the transport team.

## 2016-01-06 NOTE — ED Notes (Signed)
Patient noted in rest room. No complaints, stable, in no acute distress. Q15 minute rounds and monitoring via Security Cameras to continue.  

## 2016-03-11 ENCOUNTER — Emergency Department (HOSPITAL_COMMUNITY)
Admission: EM | Admit: 2016-03-11 | Discharge: 2016-03-11 | Disposition: A | Discharge status: Other Acute Inpatient Hospital | Payer: Medicaid Other | Attending: Emergency Medicine | Admitting: Emergency Medicine

## 2016-03-11 ENCOUNTER — Encounter (HOSPITAL_COMMUNITY): Payer: Self-pay | Admitting: *Deleted

## 2016-03-11 ENCOUNTER — Inpatient Hospital Stay (HOSPITAL_COMMUNITY)
Admission: AD | Admit: 2016-03-11 | Discharge: 2016-04-16 | DRG: 885 | Disposition: A | Discharge status: Home / Self Care | Payer: Medicaid Other | Attending: Psychiatry | Admitting: Psychiatry

## 2016-03-11 DIAGNOSIS — E119 Type 2 diabetes mellitus without complications: Secondary | ICD-10-CM | POA: Insufficient documentation

## 2016-03-11 DIAGNOSIS — R451 Restlessness and agitation: Secondary | ICD-10-CM | POA: Diagnosis not present

## 2016-03-11 DIAGNOSIS — G4700 Insomnia, unspecified: Secondary | ICD-10-CM | POA: Diagnosis present

## 2016-03-11 DIAGNOSIS — Z9119 Patient's noncompliance with other medical treatment and regimen: Secondary | ICD-10-CM

## 2016-03-11 DIAGNOSIS — I517 Cardiomegaly: Secondary | ICD-10-CM | POA: Diagnosis present

## 2016-03-11 DIAGNOSIS — F17200 Nicotine dependence, unspecified, uncomplicated: Secondary | ICD-10-CM | POA: Diagnosis present

## 2016-03-11 DIAGNOSIS — Z79899 Other long term (current) drug therapy: Secondary | ICD-10-CM

## 2016-03-11 DIAGNOSIS — I4581 Long QT syndrome: Secondary | ICD-10-CM | POA: Diagnosis present

## 2016-03-11 DIAGNOSIS — R4689 Other symptoms and signs involving appearance and behavior: Secondary | ICD-10-CM

## 2016-03-11 DIAGNOSIS — R9431 Abnormal electrocardiogram [ECG] [EKG]: Secondary | ICD-10-CM | POA: Diagnosis not present

## 2016-03-11 DIAGNOSIS — R600 Localized edema: Secondary | ICD-10-CM | POA: Clinically undetermined

## 2016-03-11 DIAGNOSIS — I10 Essential (primary) hypertension: Secondary | ICD-10-CM | POA: Diagnosis present

## 2016-03-11 DIAGNOSIS — Z9181 History of falling: Secondary | ICD-10-CM

## 2016-03-11 DIAGNOSIS — E7220 Disorder of urea cycle metabolism, unspecified: Secondary | ICD-10-CM | POA: Diagnosis present

## 2016-03-11 DIAGNOSIS — F209 Schizophrenia, unspecified: Secondary | ICD-10-CM | POA: Insufficient documentation

## 2016-03-11 DIAGNOSIS — F419 Anxiety disorder, unspecified: Secondary | ICD-10-CM | POA: Diagnosis present

## 2016-03-11 DIAGNOSIS — Z9889 Other specified postprocedural states: Secondary | ICD-10-CM | POA: Diagnosis not present

## 2016-03-11 DIAGNOSIS — Z794 Long term (current) use of insulin: Secondary | ICD-10-CM

## 2016-03-11 DIAGNOSIS — F17210 Nicotine dependence, cigarettes, uncomplicated: Secondary | ICD-10-CM | POA: Diagnosis not present

## 2016-03-11 DIAGNOSIS — R4587 Impulsiveness: Secondary | ICD-10-CM | POA: Diagnosis present

## 2016-03-11 DIAGNOSIS — R32 Unspecified urinary incontinence: Secondary | ICD-10-CM | POA: Diagnosis present

## 2016-03-11 DIAGNOSIS — Z888 Allergy status to other drugs, medicaments and biological substances status: Secondary | ICD-10-CM | POA: Diagnosis not present

## 2016-03-11 DIAGNOSIS — F250 Schizoaffective disorder, bipolar type: Secondary | ICD-10-CM | POA: Diagnosis not present

## 2016-03-11 DIAGNOSIS — Z9114 Patient's other noncompliance with medication regimen: Secondary | ICD-10-CM

## 2016-03-11 DIAGNOSIS — Z818 Family history of other mental and behavioral disorders: Secondary | ICD-10-CM | POA: Diagnosis not present

## 2016-03-11 DIAGNOSIS — E878 Other disorders of electrolyte and fluid balance, not elsewhere classified: Secondary | ICD-10-CM | POA: Diagnosis not present

## 2016-03-11 DIAGNOSIS — E118 Type 2 diabetes mellitus with unspecified complications: Secondary | ICD-10-CM | POA: Diagnosis not present

## 2016-03-11 LAB — CBC WITH DIFFERENTIAL/PLATELET
Basophils Absolute: 0 10*3/uL (ref 0.0–0.1)
Basophils Relative: 0 %
Eosinophils Absolute: 0.1 10*3/uL (ref 0.0–0.7)
Eosinophils Relative: 0 %
HEMATOCRIT: 36.1 % (ref 36.0–46.0)
Hemoglobin: 12 g/dL (ref 12.0–15.0)
LYMPHS ABS: 3.2 10*3/uL (ref 0.7–4.0)
Lymphocytes Relative: 24 %
MCH: 29.7 pg (ref 26.0–34.0)
MCHC: 33.2 g/dL (ref 30.0–36.0)
MCV: 89.4 fL (ref 78.0–100.0)
MONOS PCT: 8 %
Monocytes Absolute: 1.1 10*3/uL — ABNORMAL HIGH (ref 0.1–1.0)
NEUTROS ABS: 9.3 10*3/uL — AB (ref 1.7–7.7)
NEUTROS PCT: 68 %
Platelets: 300 10*3/uL (ref 150–400)
RBC: 4.04 MIL/uL (ref 3.87–5.11)
RDW: 16.2 % — ABNORMAL HIGH (ref 11.5–15.5)
WBC: 13.7 10*3/uL — ABNORMAL HIGH (ref 4.0–10.5)

## 2016-03-11 LAB — RAPID URINE DRUG SCREEN, HOSP PERFORMED
Amphetamines: NOT DETECTED
BARBITURATES: NOT DETECTED
BENZODIAZEPINES: NOT DETECTED
Cocaine: NOT DETECTED
Opiates: NOT DETECTED
Tetrahydrocannabinol: NOT DETECTED

## 2016-03-11 LAB — COMPREHENSIVE METABOLIC PANEL
ALK PHOS: 52 U/L (ref 38–126)
ALT: 18 U/L (ref 14–54)
ANION GAP: 8 (ref 5–15)
AST: 28 U/L (ref 15–41)
Albumin: 3.6 g/dL (ref 3.5–5.0)
BILIRUBIN TOTAL: 0.8 mg/dL (ref 0.3–1.2)
BUN: 17 mg/dL (ref 6–20)
CALCIUM: 8.9 mg/dL (ref 8.9–10.3)
CO2: 26 mmol/L (ref 22–32)
Chloride: 105 mmol/L (ref 101–111)
Creatinine, Ser: 1.04 mg/dL — ABNORMAL HIGH (ref 0.44–1.00)
GFR calc non Af Amer: >60 mL/min (ref >60)
Glucose, Bld: 82 mg/dL (ref 65–99)
Potassium: 3.7 mmol/L (ref 3.5–5.1)
Sodium: 139 mmol/L (ref 135–145)
TOTAL PROTEIN: 7.3 g/dL (ref 6.5–8.1)

## 2016-03-11 LAB — PREGNANCY, URINE: PREG TEST UR: NEGATIVE

## 2016-03-11 LAB — SALICYLATE LEVEL: Salicylate Lvl: <7 mg/dL (ref 2.8–30.0)

## 2016-03-11 LAB — LITHIUM LEVEL: Lithium Lvl: <0.06 mmol/L — ABNORMAL LOW (ref 0.60–1.20)

## 2016-03-11 LAB — ACETAMINOPHEN LEVEL

## 2016-03-11 LAB — ETHANOL: Alcohol, Ethyl (B): <5 mg/dL (ref <5)

## 2016-03-11 MED ORDER — IBUPROFEN 200 MG PO TABS
600.0000 mg | ORAL_TABLET | Freq: Three times a day (TID) | ORAL | Status: DC | PRN
Start: 2016-03-11 — End: 2016-03-11

## 2016-03-11 MED ORDER — ALUM & MAG HYDROXIDE-SIMETH 200-200-20 MG/5ML PO SUSP: 30.0000 mL | ORAL | Status: DC | PRN

## 2016-03-11 MED ORDER — ACETAMINOPHEN 325 MG PO TABS: 650.0000 mg | ORAL_TABLET | ORAL | Status: DC | PRN

## 2016-03-11 MED ORDER — LORAZEPAM 1 MG PO TABS: 1.0000 mg | ORAL_TABLET | Freq: Three times a day (TID) | ORAL | Status: DC | PRN

## 2016-03-11 MED ORDER — ONDANSETRON HCL 4 MG PO TABS: 4.0000 mg | ORAL_TABLET | Freq: Three times a day (TID) | ORAL | Status: DC | PRN

## 2016-03-11 NOTE — BH Assessment (Addendum)
Per Nanine MeansJamison Lord, NP, patient meets criteria for INPT treatment. [facility]patient's nurse [facility]. EDP/Dr. Lavera Guiseana Duo Liu will complete IVC.  Nursing report [phone].

## 2016-03-11 NOTE — ED Notes (Signed)
Pt stated that it is okay for her daughter to take her belongings home with her.

## 2016-03-11 NOTE — ED Notes (Signed)
Pt sleeping at present, no distress noted, sedated at present.  Easily arouseable to verbal stimuli.  Monitoring for safety, Q 15 min checks in effect.

## 2016-03-11 NOTE — ED Notes (Signed)
RN Debbe BalesBrook accepted report.  [employer] transfer requested, pending [employer] transport.

## 2016-03-11 NOTE — ED Notes (Signed)
Pt wanded by security. 
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
## 2016-03-11 NOTE — ED Notes (Signed)
Pt ambulatory w/o difficulty from TCU, wanded buy security on arrival.  Pt unable to converse or answer questions, rambling responses top question.
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
## 2016-03-11 NOTE — ED Notes (Signed)
Pt daughters contact information  Kaylia [phone] Delene LollNarielle [phone]

## 2016-03-11 NOTE — ED Notes (Signed)
Bed: [facility]37 Expected date:  Expected time:  Means of arrival:  Comments: Room 27 

## 2016-03-11 NOTE — ED Notes (Signed)
Pt ambulatory from TCU w/o difficulty.  Pt alert, sleepy, unable to answer questions, rambling speech.
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
## 2016-03-11 NOTE — ED Notes (Signed)
Sleeping, easily aroused.  Pt scrubs changed/checked.  Pt w/ rambling conversation.

## 2016-03-11 NOTE — ED Notes (Signed)
Bed: WA27 Expected date:  Expected time:  Means of arrival:  Comments: 46 yo f psych eval

## 2016-03-12 DIAGNOSIS — E119 Type 2 diabetes mellitus without complications: Secondary | ICD-10-CM

## 2016-03-12 DIAGNOSIS — Z79899 Other long term (current) drug therapy: Secondary | ICD-10-CM

## 2016-03-12 DIAGNOSIS — Z888 Allergy status to other drugs, medicaments and biological substances status: Secondary | ICD-10-CM

## 2016-03-12 DIAGNOSIS — F250 Schizoaffective disorder, bipolar type: Secondary | ICD-10-CM | POA: Diagnosis present

## 2016-03-12 LAB — GLUCOSE, CAPILLARY
GLUCOSE-CAPILLARY: 94 mg/dL (ref 65–99)
Glucose-Capillary: 100 mg/dL — ABNORMAL HIGH (ref 65–99)
Glucose-Capillary: 137 mg/dL — ABNORMAL HIGH (ref 65–99)

## 2016-03-12 MED ORDER — OLANZAPINE 10 MG PO TABS
10.0000 mg | ORAL_TABLET | Freq: Three times a day (TID) | ORAL | Status: DC | PRN
  Administered 2016-03-12: 10 mg via ORAL
  Filled 2016-03-12: qty 1

## 2016-03-12 MED ORDER — ALUM & MAG HYDROXIDE-SIMETH 200-200-20 MG/5ML PO SUSP
30.0000 mL | ORAL | Status: DC | PRN
  Administered 2016-03-19 – 2016-04-11 (×2): 30 mL via ORAL
  Filled 2016-03-12 (×2): qty 30

## 2016-03-12 MED ORDER — ACETAMINOPHEN 325 MG PO TABS
650.0000 mg | ORAL_TABLET | Freq: Four times a day (QID) | ORAL | Status: DC | PRN
  Administered 2016-03-21 – 2016-04-14 (×15): 650 mg via ORAL
  Filled 2016-03-12 (×17): qty 2

## 2016-03-12 MED ORDER — OLANZAPINE 10 MG PO TBDP
10.0000 mg | ORAL_TABLET | Freq: Three times a day (TID) | ORAL | Status: DC | PRN
  Administered 2016-03-12 – 2016-03-13 (×2): 10 mg via ORAL
  Filled 2016-03-12 (×2): qty 1

## 2016-03-12 MED ORDER — OLANZAPINE 10 MG IM SOLR: 10.0000 mg | Freq: Three times a day (TID) | INTRAMUSCULAR | Status: DC | PRN

## 2016-03-12 MED ORDER — INSULIN ASPART 100 UNIT/ML ~~LOC~~ SOLN: 0.0000 [IU] | Freq: Every day | SUBCUTANEOUS | Status: DC

## 2016-03-12 MED ORDER — LORAZEPAM 1 MG PO TABS
1.0000 mg | ORAL_TABLET | ORAL | Status: AC | PRN
  Administered 2016-03-12: 1 mg via ORAL
  Filled 2016-03-12: qty 1

## 2016-03-12 MED ORDER — LORAZEPAM 1 MG PO TABS
2.0000 mg | ORAL_TABLET | Freq: Once | ORAL | Status: AC
  Administered 2016-03-12: 2 mg via ORAL
  Filled 2016-03-12: qty 2

## 2016-03-12 MED ORDER — HYDROXYZINE HCL 50 MG PO TABS
50.0000 mg | ORAL_TABLET | Freq: Once | ORAL | Status: AC
  Administered 2016-03-12: 50 mg via ORAL
  Filled 2016-03-12 (×2): qty 1

## 2016-03-12 MED ORDER — ARIPIPRAZOLE 5 MG PO TABS
5.0000 mg | ORAL_TABLET | Freq: Every day | ORAL | Status: DC
  Administered 2016-03-12: 5 mg via ORAL
  Filled 2016-03-12 (×3): qty 1

## 2016-03-12 MED ORDER — ARIPIPRAZOLE 2 MG PO TABS
2.0000 mg | ORAL_TABLET | Freq: Every day | ORAL | Status: DC
  Administered 2016-03-12 – 2016-03-13 (×2): 2 mg via ORAL
  Filled 2016-03-12 (×4): qty 1

## 2016-03-12 MED ORDER — LAMOTRIGINE 25 MG PO TABS
25.0000 mg | ORAL_TABLET | Freq: Every day | ORAL | Status: DC
  Administered 2016-03-12 – 2016-03-15 (×4): 25 mg via ORAL
  Filled 2016-03-12 (×7): qty 1

## 2016-03-12 MED ORDER — MAGNESIUM HYDROXIDE 400 MG/5ML PO SUSP: 30.0000 mL | Freq: Every day | ORAL | Status: DC | PRN

## 2016-03-12 MED ORDER — INSULIN ASPART 100 UNIT/ML ~~LOC~~ SOLN
0.0000 [IU] | Freq: Three times a day (TID) | SUBCUTANEOUS | Status: DC
Start: 2016-03-12 — End: 2016-04-16
  Administered 2016-03-14 – 2016-04-14 (×13): 2 [IU] via SUBCUTANEOUS

## 2016-03-12 MED ORDER — QUETIAPINE FUMARATE ER 50 MG PO TB24
100.0000 mg | ORAL_TABLET | Freq: Every evening | ORAL | Status: DC | PRN
  Administered 2016-03-12: 100 mg via ORAL
  Filled 2016-03-12 (×4): qty 2

## 2016-03-12 MED ORDER — OLANZAPINE 10 MG PO TBDP
10.0000 mg | ORAL_TABLET | Freq: Three times a day (TID) | ORAL | Status: DC | PRN
  Administered 2016-03-12: 10 mg via ORAL
  Filled 2016-03-12: qty 1

## 2016-03-12 MED ORDER — ZIPRASIDONE MESYLATE 20 MG IM SOLR: 20.0000 mg | INTRAMUSCULAR | Status: DC | PRN

## 2016-03-12 MED ORDER — TRAZODONE HCL 100 MG PO TABS
100.0000 mg | ORAL_TABLET | Freq: Every evening | ORAL | Status: DC | PRN
  Administered 2016-03-12: 100 mg via ORAL
  Filled 2016-03-12: qty 1

## 2016-03-12 NOTE — Progress Notes (Signed)
Observation note 10:00: Pt presents with flight of ideas, disorganized thoughts and tangential speech. Pt constantly worrying this morning and tearful. Pt becomes afraid and paranoid when talking. Pt difficult to understand due to mumbling. Pt disoriented and confused. Pt hyer-religous and stated, "I hear the voice of God". Pt easily agitated and requires redirecting by staff. Pt gait unsteady and pt requires assistance with ADL,s. Sitter bathe pt this morning and assisted pt with changing scrubs. Pt using a wheelchair throughout the shift for unsteady gait and hx of falls.

## 2016-03-12 NOTE — Progress Notes (Signed)
Patient lacks capacity to participate in disposition planning.  Betzaida Cremeens ,MD Attending Psychiatrist  [facility]  
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
## 2016-03-12 NOTE — Progress Notes (Signed)
46 year old female pt admitted on involuntary basis. On admission, she presents as disheveled with body odor as well as disorganized thought process. Catherine Bender was unable to completely sign all paperwork. She was able to answer a few questions but would then ramble on about various other things that were not pertinent to the question. Catherine Bender did talk about how she needs therapy. Unable to determine if she is suicidal on admission as she would not answer that question. She also spoke about how she was tired and needed to get some rest. Catherine Bender did allow skin search to be performed and was then escorted to her room by staff.

## 2016-03-12 NOTE — Progress Notes (Signed)
Observation note 1400: Pt agitated and irritable. Pt constantly worrying and having crying spells. Pt presents with fight of ideas, racing thoughts, hyper religous and paranoia. Pt is fearful due to constantly worrying about several things. Pt given prn meds for agitation. Pt have not responded to meds. Pt remains agitated. Pt remains on 1:1 observation for safety.

## 2016-03-12 NOTE — Progress Notes (Signed)
Pt in her room clearly hallucinating   Talking to people who werent there   Agitated and angry   Offered her medications and gingerale    She needed much prompting to take her medication but did finally take them   She is cooperative with much prompting and redirection and encouragement   She has a strong body odor and seems to be somewhat limited cognitively and lacking social skills at this point   Q 15 min checks   Pt is safe at present

## 2016-03-12 NOTE — Progress Notes (Signed)
Pt asleep. Unable to assess CBG at dinner time. Will allow for pt to sleep due to progressive agitation this evening. Will continue to monitor pt.
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
## 2016-03-12 NOTE — Progress Notes (Signed)
Recreation Therapy Notes  INPATIENT RECREATION THERAPY ASSESSMENT  Patient Details Name: Catherine LimberRenitha Bender MRN: [id number] DOB: 05/16/1970 Today's Date: 03/12/2016  Patient Stressors: Other (Comment) (People talking when I'm talking)  Pt stated she was here to get herself together so she can find herself.  Coping Skills:   Isolate, Avoidance, Exercise, Art/Dance, Talking, Music  Personal Challenges: Anger, Concentration, Expressing Yourself, Problem-Solving  Leisure Interests (2+):  Music - Listen, Individual - Napping, Individual - Other (Comment) (Cigarettes)  Awareness of Community Resources:   (Pt could not answer)  Patient Strengths:  Jesus, me  Patient Identified Areas of Improvement:  Could not answer  Current Recreation Participation:  "All my life"  Patient Goal for Hospitalization:  "Get my driver's license"  [place]  [place]idence[place]  Current [place] (including self-harm):  No  Current HI:  No  Consent to Intern Participation: N/A   Caroll RancherMarjette Martasia Talamante, LRT/CTRS  Caroll RancherLindsay, Monda Chastain A 03/12/2016, 12:54 PM

## 2016-03-12 NOTE — Progress Notes (Signed)
Recreation Therapy Notes  Date: 03/12/16 Time: 1000 Location: 500 Hall Dayroom  Group Topic: Communication, Team Building, Problem Solving  Goal Area(s) Addresses:  Patient will effectively work with peer towards shared goal.  Patient will identify skills used to make activity successful.  Patient will identify how skills used during activity can be used to reach post d/c goals.   Intervention: STEM Activity  Activity: Stage managerLanding Pad. In teams patients were given 12 plastic drinking straws and a length of masking tape. Using the materials provided patients were asked to build a landing pad to catch a golf ball dropped from approximately 6 feet in the air.   Education: Pharmacist, communityocial Skills, Discharge Planning   Education Outcome: Acknowledges education/In group clarification offered/Needs additional education.   Clinical Observations/Feedback:  Pt did not attend group.   Caroll RancherMarjette Emmely Bittinger, LRT/CTRS    Caroll RancherLindsay, Arrian Manson A 03/12/2016 12:07 PM

## 2016-03-12 NOTE — Progress Notes (Signed)
Adult Psychoeducational Group Note  Date:  03/12/2016 Time:  9:28 PM  Group Topic/Focus:  Wrap-Up Group:   The focus of this group is to help patients review their daily goal of treatment and discuss progress on daily workbooks.  Participation Level:  Did Not Attend  Participation Quality:  Supportive  Affect:  Did not attend  Cognitive:  Did not attend  Insight: None  Engagement in Group:  Did not attend  Modes of Intervention:  Did not attend  Additional Comments:  Patient did not attend group this evening.   Eziah Negro L Keino Placencia 03/12/2016, 9:28 PM

## 2016-03-12 NOTE — Tx Team (Signed)
Initial Treatment Plan 03/12/2016 12:04 AM [name] ZOX:[id number]    PATIENT STRESSORS: Medication change or noncompliance   PATIENT STRENGTHS: Average or above average intelligence Capable of independent living Supportive family/friends   PATIENT IDENTIFIED PROBLEMS: Psychosis "I need therapy"                     DISCHARGE CRITERIA:  Ability to meet basic life and health needs Improved stabilization in mood, thinking, and/or behavior Verbal commitment to aftercare and medication compliance  PRELIMINARY DISCHARGE PLAN: Attend aftercare/continuing care group Return to previous living arrangement  PATIENT/FAMILY INVOLVEMENT: This treatment plan has been presented to and reviewed with the patient, Catherine LimberRenitha Bender, and/or family member, .  The patient and family have been given the opportunity to ask questions and make suggestions.  [name], [place], [place] 03/12/2016, 12:04 AM

## 2016-03-12 NOTE — Progress Notes (Signed)
Observation note 1800: Pt asleep. Pt do not appear to be in distress. Resp are even and unlabored. Pt remains on 1:1 observation for safety.

## 2016-03-12 NOTE — [facility] Suicide Risk Assessment (Signed)
[facility]   Nursing information obtained from:    Demographic factors:    Current Mental Status:    Loss Factors:    Historical Factors:    Risk Reduction Factors:     Total Time spent with patient: 30 minutes Principal Problem: Schizoaffective disorder, bipolar type (HCC) Diagnosis:   Patient Active Problem List   Diagnosis Date Noted  . Schizoaffective disorder, bipolar type (HCC) [F25.0] 03/12/2016  . Diabetes mellitus (HCC) [E11.9] 03/12/2016   Subjective Data: Patient presented with psychosis - is delusional , and labile , tangential - unable to participate much in evaluation since she is intrusive , requires constant redirection.  Continued Clinical Symptoms:  Alcohol Use Disorder Identification Test Final Score (AUDIT): 0 The "Alcohol Use Disorders Identification Test", Guidelines for Use in Primary Care, Second Edition.  World Science writerHealth Organization [facility](WHO). Score between 0-7:  no or low risk or alcohol related problems. Score between 8-15:  moderate risk of alcohol related problems. Score between 16-19:  high risk of alcohol related problems. Score 20 or above:  warrants further diagnostic evaluation for alcohol dependence and treatment.   CLINICAL FACTORS:   Unstable or Poor Therapeutic Relationship Previous Psychiatric Diagnoses and Treatments Medical Diagnoses and Treatments/Surgeries   Musculoskeletal: Strength & Muscle Tone: within normal limits Gait & Station: unsteady Patient leans: N/A  Psychiatric Specialty Exam: Physical Exam  Review of Systems  Psychiatric/Behavioral: Positive for hallucinations. The patient is nervous/anxious and has insomnia.   All other systems reviewed and are negative.   Blood pressure (!) 137/106, pulse 88, temperature 97.9 F (36.6 C), resp. rate 16, height 5\' 5"  (1.651 m), weight 92.5 kg (204 lb).Body mass index is 33.95 kg/m.  General Appearance: Disheveled  Eye Contact:  Minimal  Speech:   Pressured  Volume:  Normal  Mood:  Anxious, Depressed and Irritable  Affect:  Labile and Tearful  Thought Process:  Irrelevant and Descriptions of Associations: Tangential  Orientation:  Other:  time, situation, self  Thought Content:  Delusions, Paranoid Ideation and Rumination  Suicidal Thoughts:  did not express any - but is delusional, paranoid  Homicidal Thoughts:  No  Memory:  Immediate;   Fair Recent;   Poor Remote;   Poor  Judgement:  Impaired  Insight:  Shallow  Psychomotor Activity:  Restlessness  Concentration:  Concentration: Poor and Attention Span: Poor  Recall:  [employer]Fair  Fund of Knowledge:  Fair  Language:  Fair  Akathisia:  No  Handed:  Right  AIMS (if indicated):     Assets:  Desire for Improvement  ADL's:  Intact  Cognition:  WNL  Sleep:  Number of Hours: 5.25      COGNITIVE FEATURES THAT CONTRIBUTE TO RISK:  Closed-mindedness, Polarized thinking and Thought constriction (tunnel vision)    SUICIDE RISK:   Moderate:  Frequent suicidal ideation with limited intensity, and duration, some specificity in terms of plans, no associated intent, good self-control, limited dysphoria/symptomatology, some risk factors present, and identifiable protective factors, including available and accessible social support.  PLAN OF CARE: Patient is a poor historian and is unable to give details about medications or past hx. Will need to obtain collateral information from family. Will start Abilify for psychosis and Lamictal for mood lability. Please see H&P per NP for further information. Will start 1:1 precaution for safety since pt is a high fall risk as well as is very disorganized and intrusive .  I certify that inpatient services furnished can reasonably be expected to  improve the patient's condition.   Rhyatt Muska, MD 03/12/2016, 11:11 AM

## 2016-03-12 NOTE — Tx Team (Signed)
Interdisciplinary Treatment and Diagnostic Plan Update  03/12/2016 Time of Session: 12:46 PM  Catherine Bender MRN: [id number]  Principal Diagnosis: Schizoaffective disorder, bipolar type [facility])  Secondary Diagnoses: Principal Problem:   Schizoaffective disorder, bipolar type ([place])   Current Medications:  Current Facility-Administered Medications  Medication Dose Route Frequency Provider Last Rate Last Dose  . acetaminophen (TYLENOL) tablet 650 mg  650 mg Oral Q6H PRN Laverle Hobby, PA-C      . alum & mag hydroxide-simeth (MAALOX/MYLANTA) 200-200-20 MG/5ML suspension 30 mL  30 mL Oral Q4H PRN Laverle Hobby, PA-C      . ARIPiprazole (ABILIFY) tablet 2 mg  2 mg Oral Daily Ursula Alert, MD   2 mg at 03/12/16 1119  . ARIPiprazole (ABILIFY) tablet 5 mg  5 mg Oral QHS Saramma Eappen, MD      . insulin aspart (novoLOG) injection 0-15 Units  0-15 Units Subcutaneous TID WC Saramma Eappen, MD      . insulin aspart (novoLOG) injection 0-5 Units  0-5 Units Subcutaneous QHS Saramma Eappen, MD      . lamoTRIgine (LAMICTAL) tablet 25 mg  25 mg Oral Daily Saramma Eappen, MD   25 mg at 03/12/16 1119  . magnesium hydroxide (MILK OF MAGNESIA) suspension 30 mL  30 mL Oral Daily PRN Laverle Hobby, PA-C      . OLANZapine (ZYPREXA) tablet 10 mg  10 mg Oral TID PRN Ursula Alert, MD   10 mg at 03/12/16 1125   Or  . OLANZapine (ZYPREXA) injection 10 mg  10 mg Intramuscular TID PRN Ursula Alert, MD      . traZODone (DESYREL) tablet 100 mg  100 mg Oral QHS PRN Ursula Alert, MD        PTA Medications: Prescriptions Prior to Admission  Medication Sig Dispense Refill Last Dose  . LITHIUM PO Take 1 tablet by mouth once.   Not Taking at Unknown time    Treatment Modalities: Medication Management, Group therapy, Case management,  1 to 1 session with clinician, Psychoeducation, Recreational therapy.   Physician Treatment Plan for Primary Diagnosis: Schizoaffective disorder, bipolar type ([place]) Long Term  Goal(s): Improvement in symptoms so as ready for discharge  Short Term Goals: Compliance with prescribed medications will improve  Medication Management: Evaluate patient's response, side effects, and tolerance of medication regimen.  Therapeutic Interventions: 1 to 1 sessions, Unit Group sessions and Medication administration.  Evaluation of Outcomes: Not Met  Physician Treatment Plan for Secondary Diagnosis: Principal Problem:   Schizoaffective disorder, bipolar type ([place])   Long Term Goal(s): Improvement in symptoms so as ready for discharge  Short Term Goals: Ability to demonstrate self-control will improve  Medication Management: Evaluate patient's response, side effects, and tolerance of medication regimen.  Therapeutic Interventions: 1 to 1 sessions, Unit Group sessions and Medication administration.  Evaluation of Outcomes: Not Met   RN Treatment Plan for Primary Diagnosis: Schizoaffective disorder, bipolar type ([place]) Long Term Goal(s): Knowledge of disease and therapeutic regimen to maintain health will improve  Short Term Goals: Ability to demonstrate self-control and Compliance with prescribed medications will improve  Medication Management: RN will administer medications as ordered by provider, will assess and evaluate patient's response and provide education to patient for prescribed medication. RN will report any adverse and/or side effects to prescribing provider.  Therapeutic Interventions: 1 on 1 counseling sessions, Psychoeducation, Medication administration, Evaluate responses to treatment, Monitor vital signs and CBGs as ordered, Perform/monitor CIWA, COWS, AIMS and Fall Risk screenings as ordered, Perform  wound care treatments as ordered.  Evaluation of Outcomes: Not Met   LCSW Treatment Plan for Primary Diagnosis: Schizoaffective disorder, bipolar type ([place]) Long Term Goal(s): Safe transition to appropriate next level of care at discharge, Engage patient in  therapeutic group addressing interpersonal concerns.  Short Term Goals: Engage patient in aftercare planning with referrals and resources and Increase skills for wellness and recovery  Therapeutic Interventions: Assess for all discharge needs, 1 to 1 time with Social worker, Explore available resources and support systems, Assess for adequacy in community support network, Educate family and significant other(s) on suicide prevention, Complete Psychosocial Assessment, Interpersonal group therapy.  Evaluation of Outcomes: Not Met   Progress in Treatment: Attending groups: Yes Participating in groups: Yes Taking medication as prescribed: Yes, MD continues to assess for medication changes as needed Toleration medication: Yes, no side effects reported at this time Family/Significant other contact made: Yes, [name] (daughter, [phone]) Patient understands diagnosis: No, limited insight Discussing patient identified problems/goals with staff: Yes Medical problems stabilized or resolved: Yes Denies suicidal/homicidal ideation: Yes Issues/concerns per patient self-inventory: None Other: N/A  New problem(s) identified: None identified at this time.   New Short Term/Long Term Goal(s): None identified at this time.   Discharge Plan or Barriers: Unknown at this time, CSW will continue to follow and assess for options.   Reason for Continuation of Hospitalization: Anxiety Delusions  Hallucinations Medication stabilization Disorganization    Estimated Length of Stay: 3-5 days  Attendees: Patient: 03/12/2016  12:46 PM  Physician: Dr. Shea Evans 03/12/2016  12:46 PM  Nursing: Sharl Ma. Viona Gilmore, RN  03/12/2016  12:46 PM  RN Care Manager: Lars Pinks 03/12/2016  12:46 PM  Social Worker: Ripley Fraise, [place] 03/12/2016  12:46 PM  Recreational Therapist: Winfield Cunas 03/12/2016  12:46 PM  Other: Radonna Ricker, Social Work Intern  03/12/2016  12:46 PM  Other:  03/12/2016  12:46 PM  Other: 03/12/2016  12:46  PM    Scribe for Treatment Team: Radonna Ricker, Social Work Intern 03/12/2016 12:46 PM

## 2016-03-12 NOTE — Progress Notes (Signed)
Nursing Note 1:1 Patient appear drowsy and irritable at times. Ambulates on wheel chair. Patient constantly talking incoherently. Accepted her due/prn meds. Patient remains awake, talking at this time.   Sitter within reach. Patient remains on 1:1 observation for safety.

## 2016-03-12 NOTE — Progress Notes (Signed)
Pt alert. CBG obtained. Results 94.

## 2016-03-12 NOTE — [facility] Suicide Risk Assessment (Signed)
[facility] INPATIENT:  Family/Significant Other Suicide Prevention Education  Suicide Prevention Education:  Education Completed; No one has been identified by the patient as the family member/significant other with whom the patient will be residing, and identified as the person(s) who will aid the patient in the event of a mental health crisis (suicidal ideations/suicide attempt).  With written consent from the patient, the family member/significant other has been provided the following suicide prevention education, prior to the and/or following the discharge of the patient.  The suicide prevention education provided includes the following:  Suicide risk factors  Suicide prevention and interventions  National Suicide Hotline telephone number  [facility] assessment telephone number  [facility] Emergency Assistance 911  [facility] and/or Residential Mobile Crisis Unit telephone number  Request made of family/significant other to:  Remove weapons (e.g., guns, rifles, knives), all items previously/currently identified as safety concern.    Remove drugs/medications (over-the-counter, prescriptions, illicit drugs), all items previously/currently identified as a safety concern.  The family member/significant other verbalizes understanding of the suicide prevention education information provided.  The family member/significant other agrees to remove the items of safety concern listed above.Patient did not endorse suicidal ideations prior to or during her stay here at the hospital. SPE not required, however CSW intern did speak with her daughter Delene Lollarielle [phone]) to complete assessment.  Baldo DaubJolan Rolin Schult 03/12/2016, 12:23 PM

## 2016-03-12 NOTE — [facility] Group Notes (Signed)
[facility] Group Notes:  (Counselor/Nursing/MHT/Case Management/Adjunct)  03/12/2016 1:15PM  Type of Therapy:  Group Therapy  Participation Level:  Active  Participation Quality:  Appropriate  Affect:  Flat  Cognitive:  Oriented  Insight:  Improving  Engagement in Group:  Limited  Engagement in Therapy:  Limited  Modes of Intervention:  Discussion, Exploration and Socialization  Summary of Progress/Problems: The topic for group was balance in life.  Pt participated in the discussion about when their life was in balance and out of balance and how this feels.  Pt discussed ways to get back in balance and short term goals they can work on to get where they want to be. Invited.  Chose to not attend.   [name], Olamide Lahaie B 03/12/2016 2:54 PM

## 2016-03-13 ENCOUNTER — Inpatient Hospital Stay (HOSPITAL_COMMUNITY): Payer: Medicaid Other

## 2016-03-13 ENCOUNTER — Encounter (HOSPITAL_COMMUNITY): Payer: Self-pay | Admitting: Emergency Medicine

## 2016-03-13 DIAGNOSIS — R9431 Abnormal electrocardiogram [ECG] [EKG]: Secondary | ICD-10-CM

## 2016-03-13 LAB — CBC WITH DIFFERENTIAL/PLATELET
BASOS ABS: 0 10*3/uL (ref 0.0–0.1)
BASOS PCT: 0 %
EOS ABS: 0.1 10*3/uL (ref 0.0–0.7)
Eosinophils Relative: 1 %
HCT: 38.1 % (ref 36.0–46.0)
HEMOGLOBIN: 12.4 g/dL (ref 12.0–15.0)
Lymphocytes Relative: 25 %
Lymphs Abs: 2.5 10*3/uL (ref 0.7–4.0)
MCH: 29 pg (ref 26.0–34.0)
MCHC: 32.5 g/dL (ref 30.0–36.0)
MCV: 89.2 fL (ref 78.0–100.0)
Monocytes Absolute: 0.7 10*3/uL (ref 0.1–1.0)
Monocytes Relative: 7 %
NEUTROS PCT: 67 %
Neutro Abs: 6.6 10*3/uL (ref 1.7–7.7)
Platelets: 328 10*3/uL (ref 150–400)
RBC: 4.27 MIL/uL (ref 3.87–5.11)
RDW: 15.9 % — ABNORMAL HIGH (ref 11.5–15.5)
WBC: 9.9 10*3/uL (ref 4.0–10.5)

## 2016-03-13 LAB — BASIC METABOLIC PANEL
ANION GAP: 9 (ref 5–15)
BUN: 11 mg/dL (ref 6–20)
CHLORIDE: 104 mmol/L (ref 101–111)
CO2: 26 mmol/L (ref 22–32)
CREATININE: 1.01 mg/dL — AB (ref 0.44–1.00)
Calcium: 9.1 mg/dL (ref 8.9–10.3)
GFR calc non Af Amer: >60 mL/min (ref >60)
Glucose, Bld: 85 mg/dL (ref 65–99)
Potassium: 4.1 mmol/L (ref 3.5–5.1)
SODIUM: 139 mmol/L (ref 135–145)

## 2016-03-13 LAB — URINALYSIS, ROUTINE W REFLEX MICROSCOPIC
BILIRUBIN URINE: NEGATIVE
Glucose, UA: NEGATIVE mg/dL
Hgb urine dipstick: NEGATIVE
KETONES UR: 5 mg/dL — AB
LEUKOCYTES UA: NEGATIVE
NITRITE: NEGATIVE
PROTEIN: NEGATIVE mg/dL
Specific Gravity, Urine: 1.015 (ref 1.005–1.030)
pH: 5 (ref 5.0–8.0)

## 2016-03-13 LAB — HEPATIC FUNCTION PANEL
ALT: 21 U/L (ref 14–54)
AST: 34 U/L (ref 15–41)
Albumin: 4.1 g/dL (ref 3.5–5.0)
Alkaline Phosphatase: 60 U/L (ref 38–126)
BILIRUBIN INDIRECT: 0.2 mg/dL — AB (ref 0.3–0.9)
Bilirubin, Direct: 0.3 mg/dL (ref 0.1–0.5)
Total Bilirubin: 0.5 mg/dL (ref 0.3–1.2)
Total Protein: 7.6 g/dL (ref 6.5–8.1)

## 2016-03-13 LAB — CBG MONITORING, ED
Glucose-Capillary: 94 mg/dL (ref 65–99)
Glucose-Capillary: 87 mg/dL (ref 65–99)

## 2016-03-13 LAB — LIPID PANEL
Cholesterol: 150 mg/dL (ref 0–200)
HDL: 43 mg/dL (ref >40)
LDL CALC: 93 mg/dL (ref 0–99)
Total CHOL/HDL Ratio: 3.5 RATIO
Triglycerides: 69 mg/dL (ref <150)
VLDL: 14 mg/dL (ref 0–40)

## 2016-03-13 LAB — RAPID HIV SCREEN (HIV 1/2 AB+AG)
HIV 1/2 ANTIBODIES: NONREACTIVE
HIV-1 P24 ANTIGEN - HIV24: NONREACTIVE

## 2016-03-13 LAB — AMMONIA: AMMONIA: 27 umol/L (ref 9–35)

## 2016-03-13 LAB — I-STAT TROPONIN, ED: TROPONIN I, POC: 0 ng/mL (ref 0.00–0.08)

## 2016-03-13 LAB — GLUCOSE, CAPILLARY
GLUCOSE-CAPILLARY: 86 mg/dL (ref 65–99)
GLUCOSE-CAPILLARY: 108 mg/dL — AB (ref 65–99)

## 2016-03-13 LAB — TSH: TSH: 1.227 u[IU]/mL (ref 0.350–4.500)

## 2016-03-13 MED ORDER — CEFTRIAXONE SODIUM 250 MG IJ SOLR
250.0000 mg | Freq: Once | INTRAMUSCULAR | Status: AC
  Administered 2016-03-13: 250 mg via INTRAMUSCULAR
  Filled 2016-03-13: qty 250

## 2016-03-13 MED ORDER — ZIPRASIDONE HCL 20 MG PO CAPS
20.0000 mg | ORAL_CAPSULE | Freq: Three times a day (TID) | ORAL | Status: DC | PRN
  Administered 2016-03-14: 20 mg via ORAL
  Filled 2016-03-13: qty 1

## 2016-03-13 MED ORDER — LIDOCAINE HCL 1 % IJ SOLN
INTRAMUSCULAR | Status: AC
  Administered 2016-03-13: 20 mL
  Filled 2016-03-13: qty 20

## 2016-03-13 MED ORDER — METRONIDAZOLE 500 MG PO TABS
2000.0000 mg | ORAL_TABLET | Freq: Once | ORAL | Status: AC
  Administered 2016-03-13: 2000 mg via ORAL
  Filled 2016-03-13: qty 4

## 2016-03-13 MED ORDER — BENZTROPINE MESYLATE 0.5 MG PO TABS
0.5000 mg | ORAL_TABLET | Freq: Every day | ORAL | Status: DC
  Administered 2016-03-16 – 2016-03-25 (×11): 0.5 mg via ORAL
  Filled 2016-03-13 (×14): qty 1

## 2016-03-13 MED ORDER — ZIPRASIDONE MESYLATE 20 MG IM SOLR
10.0000 mg | Freq: Three times a day (TID) | INTRAMUSCULAR | Status: DC | PRN
Start: 2016-03-13 — End: 2016-03-15

## 2016-03-13 MED ORDER — PALIPERIDONE ER 6 MG PO TB24
6.0000 mg | ORAL_TABLET | Freq: Every day | ORAL | Status: DC
  Filled 2016-03-13 (×3): qty 1

## 2016-03-13 MED ORDER — LORAZEPAM 1 MG PO TABS
2.0000 mg | ORAL_TABLET | Freq: Once | ORAL | Status: AC
  Administered 2016-03-13: 2 mg via ORAL
  Filled 2016-03-13: qty 2

## 2016-03-13 MED ORDER — AZITHROMYCIN 250 MG PO TABS
1000.0000 mg | ORAL_TABLET | Freq: Once | ORAL | Status: AC
  Administered 2016-03-13: 1000 mg via ORAL
  Filled 2016-03-13: qty 4

## 2016-03-13 MED ORDER — ZOLPIDEM TARTRATE 10 MG PO TABS
10.0000 mg | ORAL_TABLET | Freq: Every day | ORAL | Status: DC
  Administered 2016-03-13 – 2016-03-16 (×4): 10 mg via ORAL
  Filled 2016-03-13 (×4): qty 1

## 2016-03-13 MED ORDER — TRAZODONE HCL 100 MG PO TABS: 100.0000 mg | ORAL_TABLET | Freq: Every evening | ORAL | Status: DC | PRN

## 2016-03-13 NOTE — Progress Notes (Addendum)
[facility] MD Progress Note  03/13/2016 11:10 AM Korinne Greenstein  MRN:  [id number] Subjective:  Patient seen today as crying , pointing to her chest, coughing and incontinent on urine.     Objective:Patient seen and chart reviewed.Discussed patient with treatment team.  Pt today seen as labile, restless - unable to participate in an evaluation. Pt unable to take care of ADLs , and is a high fall risk and hence needs 1:1 precaution for safety. Pt did not respond to any questions asked , but dozed off to sleep when writer attempted to evaluate. Per RN - pt was up all night - restless, crying , not redirectable. Pt per collateral information obtained from daughter has been noncompliant on her medications - was having sexual relationship with someone which also contributed to her decompensation. Will readjust her medications - since abilify made her restless last night - will change to [place]. Will avoid BZD since it can increase her confusion. Will attempt to reassess later on today when patient is awake.   Principal Problem: Schizoaffective disorder, bipolar type (HCC) : R/O Delirium 2/2 multiple etiologies Diagnosis:   Patient Active Problem List   Diagnosis Date Noted  . Schizoaffective disorder, bipolar type (HCC) [F25.0] 03/12/2016  . Diabetes mellitus (HCC) [E11.9] 03/12/2016   Total Time spent with patient: 25 minutes  Past Psychiatric History: Please see H&P.   Past Medical History:  Past Medical History:  Diagnosis Date  . Diabetes mellitus without complication [facility])     Past Surgical History:  Procedure Laterality Date  . umbical hernia repair     Family History: Please see H&P.  Family Psychiatric  History: Please see H&P.  Social History: Please see H&P.  History  Alcohol Use No     History  Drug Use No    Social History   Social History  . Marital status: Single    Spouse name: N/A  . Number of children: N/A  . Years of education: N/A   Social History Main  Topics  . Smoking status: Current Every Day Smoker    Packs/day: 0.25  . Smokeless tobacco: Never Used  . Alcohol use No  . Drug use: No  . Sexual activity: Not Asked   Other Topics Concern  . None   Social History Narrative  . None   Additional Social History:                         Sleep: Poor  Appetite:  Fair  Current Medications: Current Facility-Administered Medications  Medication Dose Route Frequency Provider Last Rate Last Dose  . acetaminophen (TYLENOL) tablet 650 mg  650 mg Oral Q6H PRN Kerry Hough, PA-C      . alum & mag hydroxide-simeth (MAALOX/MYLANTA) 200-200-20 MG/5ML suspension 30 mL  30 mL Oral Q4H PRN Kerry Hough, PA-C      . benztropine (COGENTIN) tablet 0.5 mg  0.5 mg Oral QHS Trestan Vahle, MD      . insulin aspart (novoLOG) injection 0-15 Units  0-15 Units Subcutaneous TID WC Chadwick Reiswig, MD      . insulin aspart (novoLOG) injection 0-5 Units  0-5 Units Subcutaneous QHS Mahreen Schewe, MD      . lamoTRIgine (LAMICTAL) tablet 25 mg  25 mg Oral Daily Jomarie Longs, MD   25 mg at 03/13/16 0814  . magnesium hydroxide (MILK OF MAGNESIA) suspension 30 mL  30 mL Oral Daily PRN Kerry Hough, PA-C      .  paliperidone (INVEGA) 24 hr tablet 6 mg  6 mg Oral QHS Julena Barbour, MD      . traZODone (DESYREL) tablet 100 mg  100 mg Oral QHS PRN Jomarie Longs, MD      . ziprasidone (GEODON) capsule 20 mg  20 mg Oral TID PRN Jomarie Longs, MD       Or  . ziprasidone (GEODON) injection 10 mg  10 mg Intramuscular TID PRN Jomarie Longs, MD      . zolpidem (AMBIEN) tablet 10 mg  10 mg Oral QHS Jomarie Longs, MD        Lab Results:  Results for orders placed or performed during the hospital encounter of 03/11/16 (from the past 48 hour(s))  Glucose, capillary     Status: Abnormal   Collection Time: 03/12/16 11:29 AM  Result Value Ref Range   Glucose-Capillary 100 (H) 65 - 99 mg/dL  Glucose, capillary     Status: None   Collection Time:  03/12/16  5:40 PM  Result Value Ref Range   Glucose-Capillary 94 65 - 99 mg/dL  Glucose, capillary     Status: Abnormal   Collection Time: 03/12/16  8:37 PM  Result Value Ref Range   Glucose-Capillary 137 (H) 65 - 99 mg/dL  Glucose, capillary     Status: None   Collection Time: 03/13/16  6:08 AM  Result Value Ref Range   Glucose-Capillary 86 65 - 99 mg/dL  TSH     Status: None   Collection Time: 03/13/16  6:26 AM  Result Value Ref Range   TSH 1.227 0.350 - 4.500 uIU/mL    Comment: Performed by a 3rd Generation assay with a functional sensitivity of <=0.01 uIU/mL. Performed at [facility], [address], [place], [place] [address]   Lipid panel     Status: None   Collection Time: 03/13/16  6:26 AM  Result Value Ref Range   Cholesterol 150 0 - 200 mg/dL   Triglycerides 69 <914 mg/dL   HDL 43 >78 mg/dL   Total CHOL/HDL Ratio 3.5 RATIO   VLDL 14 0 - 40 mg/dL   LDL Cholesterol 93 0 - 99 mg/dL    Comment:        Total Cholesterol/HDL:CHD Risk Coronary Heart Disease Risk Table                     Men   Women  1/2 Average Risk   3.4   3.3  Average Risk       5.0   4.4  2 X Average Risk   9.6   7.1  3 X Average Risk  23.4   11.0        Use the calculated Patient Ratio above and the CHD Risk Table to determine the patient's CHD Risk.        ATP III CLASSIFICATION (LDL):  <100     mg/dL   Optimal  295-621  mg/dL   Near or Above                    Optimal  130-159  mg/dL   Borderline  308-657  mg/dL   High  >846     mg/dL   Very High Performed at [facility], [address], [place], [place] [address]     Blood Alcohol level:  Lab Results  Component Value Date   [facility] <5 03/11/2016   ETH <5 01/05/2016    Metabolic Disorder  Labs: No results found for: HGBA1C, MPG No results found for: PROLACTIN Lab Results  Component Value Date   CHOL 150 03/13/2016   TRIG 69 03/13/2016   HDL 43 03/13/2016   CHOLHDL 3.5 03/13/2016   VLDL 14 03/13/2016    LDLCALC 93 03/13/2016    Physical Findings: AIMS: Facial and Oral Movements Muscles of Facial Expression: None, normal Lips and Perioral Area: None, normal Jaw: None, normal Tongue: None, normal,Extremity Movements Upper (arms, wrists, hands, fingers): None, normal Lower (legs, knees, ankles, toes): None, normal, Trunk Movements Neck, shoulders, hips: None, normal, Overall Severity Severity of abnormal movements (highest score from questions above): None, normal Incapacitation due to abnormal movements: None, normal Patient's awareness of abnormal movements (rate only patient's report): No Awareness, Dental Status Current problems with teeth and/or dentures?: No Does patient usually wear dentures?: No  CIWA:    COWS:     Musculoskeletal: Strength & Muscle Tone: within normal limits Gait & Station: unsteady Patient leans: N/A  Psychiatric Specialty Exam: Physical Exam  Nursing note and vitals reviewed.   Review of Systems  Unable to perform ROS: Mental acuity    Blood pressure (!) 147/78, pulse 65, temperature 98.8 F (37.1 C), temperature source Oral, resp. rate 17, height 5\' 5"  (1.651 m), weight 92.5 kg (204 lb).Body mass index is 33.95 kg/m.  General Appearance: Disheveled  Eye Contact:  Minimal  Speech:  normal rate - but unable to participate since she keeps crying   Volume:  Increased  Mood:  Depressed and Dysphoric  Affect:  Labile and Tearful  Thought Process:  Irrelevant and Descriptions of Associations: Tangential  Orientation:  Other:  UTA- appears to oriented to person- but will need to re- evaluate when she is awake  Thought Content:  Delusions, Rumination and Tangential  Suicidal Thoughts:  did not express any   Homicidal Thoughts:  did not express any  Memory:  UTA  Judgement:  Impaired  Insight:  Shallow  Psychomotor Activity:  Restlessness  Concentration:  Concentration: Poor and Attention Span: Poor  Recall:  Poor  Fund of Knowledge:  Poor   Language:  Fair  Akathisia:  No    AIMS (if indicated):     Assets:  Social Support  ADL's:  Impaired  Cognition:  Impaired,  Mild  Sleep:  Number of Hours: 3     Treatment Plan Summary:Patient seen as labile , drowsy , tearful , restless - did not sleep at all last night - is currently on a 1;1 precaution for safety reasons- unable to participate in an evaluation this AM - since she were crying when writer attempted to evaluate and later on was seen as sleeping. Pt will need medication readjustments done - will get labs drawn, will get a UA to R/O medical causes of her presentation , also will repeat CBC - since wbc was slightly elevated ( however she was on Li in the past which could also have contributed to the same.  Schizoaffective disorder, bipolar type (HCC) R/O Delirium due to multiple etiologies  Will continue today 03/13/16 plan as below except where it is noted.   Daily contact with patient to assess and evaluate symptoms and progress in treatment and Medication management  Reviewed past medical records,treatment plan.   For schizoaffective do: Will start Invega xr 6 mg po qhs for mood sx/psychosis. Will add Cogentin 0.5 mg po qhs for EPS. Will continue Lamictal 25 mg po daily.  For R/O delirium: Will reassess. Get labs -  tsh, vitamin b12, folate, rpr, ua . Patient with recent Possible cocaine abuse ( per hx) also has hx of UTI in the past. Will avoid BZD ,medications like benadryl, vistaril which can make her more confused.  Will continue 1:1 precaution for safety reasons.  Will continue to monitor vitals ,medication compliance and treatment side effects while patient is here.   Will monitor for medical issues as well as call consult as needed.   Reviewed labs ,will order as needed.   CSW will continue working on disposition. [facility].  Patient to participate in therapeutic  milieu .        Heer Justiss, MD 03/13/2016, 11:10 AM

## 2016-03-13 NOTE — ED Notes (Signed)
Patient transported to CT 

## 2016-03-13 NOTE — ED Notes (Signed)
Bed: EA54WA19 Expected date:  Expected time:  Means of arrival:  Comments[facility]

## 2016-03-13 NOTE — Progress Notes (Signed)
Patient being transferred to the ED per Dr. Magdalene Mollyrder.  Patient has become increasingly confused and agitated.  Patient has had several incidents of urinary incontinence and has complained of urinary pain.  Patient has also been noted to have a non productive cough accompanied by chest pain.  [facility] and given to nurse Stacy at 1200.

## 2016-03-13 NOTE — Progress Notes (Signed)
Recreation Therapy Notes  Date: 03/13/16 Time: 1000 Location: 500 Hall Dayroom  Group Topic: Stress Management  Goal Area(s) Addresses:  Patient will verbalize importance of using healthy stress management.  Patient will identify positive emotions associated with healthy stress management.   Intervention: Stress Management  Activity :  Progressive Muscle Relaxation, Guided Imagery.  LRT introduced the stress management techniques of progressive muscle relaxation and guided imagery.  LRT read scripts to guide patients through the techniques so they could fully engage.  Patients were to follow along as LRT read scripts.  Education:  Stress Management, Discharge Planning.   Education Outcome: Acknowledges edcuation/In group clarification offered/Needs additional education  Clinical Observations/Feedback: Pt did not attend group.   Caroll RancherMarjette Brilee Port, LRT/CTRS         Caroll RancherLindsay, Jaydan Chretien A 03/13/2016 12:29 PM

## 2016-03-13 NOTE — Progress Notes (Signed)
Nursing 1:1 note D:Pt observed sleeping in bed with eyes closed. RR even and unlabored. No distress noted. A: 1:1 observation continues for safety  R: pt remains safe  

## 2016-03-13 NOTE — Progress Notes (Signed)
Nursing Note 1:1 Patient is in the shower at this time. Continue to talk and yell, paranoia and hyper religious. Patient remains awake all night. CBS this morning was 84 mg/dl. No distress noted. Sitter remains at arm's length at all times. Will continue to monitor patient.   Patient remains on 1:1 observation for safety.

## 2016-03-13 NOTE — Plan of Care (Signed)
Problem: Safety: Goal: Periods of time without injury will increase Outcome: Progressing Pt safe on the unit at this time   

## 2016-03-13 NOTE — ED Triage Notes (Signed)
[facility] EMS[facility] and has been increasingly confused and agitated. Pt has had several episodes of incontinence and complains of burning when she urinates. Pt is ambulatory and able to answer questions.

## 2016-03-13 NOTE — Progress Notes (Signed)
Nursing Note 1:1 Patient seen in her room awake, talking, yelling at the sitter. One time order of Ativan 2 mg po PRN for agitaion given per Barbara CowerJason NP. Medication was not effective as patient continues to talk and yell. Sitter remains at arms length at all times. Will continue to monitor patient for safety and stability. Patient remains on 1:1

## 2016-03-13 NOTE — ED Provider Notes (Signed)
Patient care assumed from Sharen Heck, PA-C at shift change. Please see her note for further.  Briefly, patient presented to [facility] long emergency department by EMS from behavioral health [facility] for changes to her mental status. Patient is admitted involuntarily to behavioral Hospital for psychosis in the setting of known history of schizophrenia and bipolar disorder. There is concern for possible UTI or infectious process causing some confusion and agitation.  At shift change patient is awaiting troponin, hepatic panel and ammonia. If these are unremarkable plan is for the patient to return back to behavioral health Hospital for completion of her treatment.   Prior provider obtained STD panel and treated the patient for gonorrhea and chlamydia. Syphilis testing is pending. Head CT is unremarkable. Chest x-ray showed mild cardiomegaly without failure. Normal TSH. Urinalysis is without signs of infection. Patient has a normal white count. BMP is unremarkable. Hepatic function panel is unremarkable. Ammonia is within normal limits. Troponin is not elevated. Patient did not complain of any chest pain.  On my evaluation the patient is alert and oriented to person and place. She has no cogwheeling rigidity. Patient is spontaneously moving all extremities in a coordinated fashion exhibiting good strength. Tongue protrusion is normal. No tongue fasciculations. Patient is redirectable and follows commands. She is pleasant. She is not agitated. She appears psychotic. She is not tachycardic or tachypneic. She is afebrile and non-toxic appearing. Will have her return to [facility] hospital for completion of her treatment.   Results for orders placed or performed during the hospital encounter of 03/11/16  Glucose, capillary  Result Value Ref Range   Glucose-Capillary 100 (H) 65 - 99 mg/dL  TSH  Result Value Ref Range   TSH 1.227 0.350 - 4.500 uIU/mL  Lipid panel  Result Value Ref Range   Cholesterol 150 0 -  200 mg/dL   Triglycerides 69 <161 mg/dL   HDL 43 >09 mg/dL   Total CHOL/HDL Ratio 3.5 RATIO   VLDL 14 0 - 40 mg/dL   LDL Cholesterol 93 0 - 99 mg/dL  Glucose, capillary  Result Value Ref Range   Glucose-Capillary 94 65 - 99 mg/dL  Glucose, capillary  Result Value Ref Range   Glucose-Capillary 137 (H) 65 - 99 mg/dL  Glucose, capillary  Result Value Ref Range   Glucose-Capillary 86 65 - 99 mg/dL  Urinalysis, Routine w reflex microscopic- may I&O cath if menses  Result Value Ref Range   Color, Urine YELLOW YELLOW   APPearance HAZY (A) CLEAR   Specific Gravity, Urine 1.015 1.005 - 1.030   pH 5.0 5.0 - 8.0   Glucose, UA NEGATIVE NEGATIVE mg/dL   Hgb urine dipstick NEGATIVE NEGATIVE   Bilirubin Urine NEGATIVE NEGATIVE   Ketones, ur 5 (A) NEGATIVE mg/dL   Protein, ur NEGATIVE NEGATIVE mg/dL   Nitrite NEGATIVE NEGATIVE   Leukocytes, UA NEGATIVE NEGATIVE  CBC with Differential/Platelet  Result Value Ref Range   WBC 9.9 4.0 - 10.5 K/uL   RBC 4.27 3.87 - 5.11 MIL/uL   Hemoglobin 12.4 12.0 - 15.0 g/dL   HCT 60.4 54.0 - 98.1 %   MCV 89.2 78.0 - 100.0 fL   MCH 29.0 26.0 - 34.0 pg   MCHC 32.5 30.0 - 36.0 g/dL   RDW 19.1 (H) 47.8 - 29.5 %   Platelets 328 150 - 400 K/uL   Neutrophils Relative % 67 %   Neutro Abs 6.6 1.7 - 7.7 K/uL   Lymphocytes Relative 25 %   Lymphs Abs 2.5 0.7 -  4.0 K/uL   Monocytes Relative 7 %   Monocytes Absolute 0.7 0.1 - 1.0 K/uL   Eosinophils Relative 1 %   Eosinophils Absolute 0.1 0.0 - 0.7 K/uL   Basophils Relative 0 %   Basophils Absolute 0.0 0.0 - 0.1 K/uL  Basic metabolic panel  Result Value Ref Range   Sodium 139 135 - 145 mmol/L   Potassium 4.1 3.5 - 5.1 mmol/L   Chloride 104 101 - 111 mmol/L   CO2 26 22 - 32 mmol/L   Glucose, Bld 85 65 - 99 mg/dL   BUN 11 6 - 20 mg/dL   Creatinine, Ser 1.611.01 (H) 0.44 - 1.00 mg/dL   Calcium 9.1 8.9 - 09.610.3 mg/dL   GFR calc non Af Amer >60 >60 mL/min   GFR calc Af Amer >60 >60 mL/min   Anion gap 9 5 - 15   Rapid HIV screen (HIV 1/2 Ab+Ag)  Result Value Ref Range   HIV-1 P24 Antigen - HIV24 NON REACTIVE NON REACTIVE   HIV 1/2 Antibodies NON REACTIVE NON REACTIVE   Interpretation (HIV Ag Ab)      A non reactive test result means that HIV 1 or HIV 2 antibodies and HIV 1 p24 antigen were not detected in the specimen.  Hepatic function panel  Result Value Ref Range   Total Protein 7.6 6.5 - 8.1 g/dL   Albumin 4.1 3.5 - 5.0 g/dL   AST 34 15 - 41 U/L   ALT 21 14 - 54 U/L   Alkaline Phosphatase 60 38 - 126 U/L   Total Bilirubin 0.5 0.3 - 1.2 mg/dL   Bilirubin, Direct 0.3 0.1 - 0.5 mg/dL   Indirect Bilirubin 0.2 (L) 0.3 - 0.9 mg/dL  Ammonia  Result Value Ref Range   Ammonia 27 9 - 35 umol/L  POC CBG, ED  Result Value Ref Range   Glucose-Capillary 94 65 - 99 mg/dL  I-Stat Troponin, ED (not at [facility])  Result Value Ref Range   Troponin i, poc 0.00 0.00 - 0.08 ng/mL   Comment 3          CBG monitoring, ED  Result Value Ref Range   Glucose-Capillary 87 65 - 99 mg/dL   Dg Chest 2 View  Result Date: 03/13/2016 CLINICAL DATA:  Change in mental status EXAM: CHEST  2 VIEW COMPARISON:  10/13/2015 FINDINGS: Mild cardiomegaly, accentuated by low volumes. Mild atelectasis or scarring seen in the lateral projection. There is no edema, consolidation, effusion, or pneumothorax. Symmetric biapical pleural thickening. No acute or aggressive finding. IMPRESSION: 1. Mild cardiomegaly without failure. 2. Mild atelectasis or scarring. Electronically Signed   By: Marnee SpringJonathon  Watts M.D.   On: 03/13/2016 14:28   Ct Head Wo Contrast  Result Date: 03/13/2016 CLINICAL DATA:  Altered mental status, delirium, schizoaffective disorder EXAM: CT HEAD WITHOUT CONTRAST TECHNIQUE: Contiguous axial images were obtained from the base of the skull through the vertex without intravenous contrast. COMPARISON:  None. FINDINGS: Brain: No evidence of acute infarction, hemorrhage, hydrocephalus, extra-axial collection or mass lesion/mass  effect. Partially empty sella, likely incidental in the absence of chronic headaches. Vascular: No hyperdense vessel or unexpected calcification. Skull: Normal. Negative for fracture or focal lesion. Sinuses/Orbits: No acute finding. Other: None. IMPRESSION: No evidence of acute intracranial abnormality. Electronically Signed   By: Charline BillsSriyesh  Krishnan M.D.   On: 03/13/2016 14:43    Change in behavior  Agitation     Everlene FarrierWilliam Loyd Marhefka, PA-C 03/13/16 1851    Tilden FossaElizabeth Rees, MD  03/17/16 1000  

## 2016-03-13 NOTE — Progress Notes (Signed)
D: Pt denies SI/HI/AVH. Pt is pleasant and cooperative. Pt appears disorganized and delusional at times. Pt needs constant redirection. Per NP pt medications that may increase QTc were held pending further investigation.   A: Pt was offered support and encouragement. Pt was given scheduled medications minus ones may increase QTc. Pt was encourage to attend groups. Q 15 minute checks were done for safety.   R: Pt is taking medication. Pt has no complaints.Pt receptive to treatment and safety maintained on unit.

## 2016-03-13 NOTE — ED Provider Notes (Signed)
[facility]-EMERGENCY DEPT Provider Note   CSN: [id number] Arrival date & time: 03/13/16  1234     History   Chief Complaint Chief Complaint  Patient presents with  . Urinary Tract Infection    HPI Catherine Bender is a 46 y.o. female with pertinent past medical history of type 2 diabetes mellitus on metformin, schizophrenia and bipolar disorder is brought into ED by EMS from [facility] for recent decline in mental status overnight.  Unfortunately patient is not able to provide history due to altered mental status and psychosis. Per chart review patient was seen in the ED on 03/11/2016 for psychiatric evaluation and concern for psychosis in setting of known history of schizophrenia and bipolar disorder. Patient was medically cleared and placed in psych hold for eval and transferred to [facility]. I personally spoke to [facility] RN Christen Bame) on the phone who managed patient overnight who stated overnight patient had 2 episodes of bladder incontinence, was intermittently reporting burning with urination and cough.  RN also mentioned pt became more difficult to redirect with increased crying, confusion, agitation, insomnia and decline in mental status overnight.  Reportedly M.D. at [facility] is concerned for UTI/STD and/or pulmonary infectious etiology that could be contributing to pt's sudden change in behavior.  Per RN pt needs medical workup for UTI, STD, cough.    When asked pt point to groin stating she is having pain. No other history obtained from patient.   HPI  Past Medical History:  Diagnosis Date  . Diabetes mellitus without complication [facility])     Patient Active Problem List   Diagnosis Date Noted  . Schizoaffective disorder, bipolar type (HCC) 03/12/2016  . Diabetes mellitus (HCC) 03/12/2016    Past Surgical History:  Procedure Laterality Date  . umbical hernia repair      OB History    No data available       Home Medications    Prior to Admission medications   Medication Sig Start Date End Date  Taking? Authorizing Provider  LITHIUM PO Take 1 tablet by mouth once.    Historical Provider, MD    Family History History reviewed. No pertinent family history.  Social History Social History  Substance Use Topics  . Smoking status: Current Every Day Smoker    Packs/day: 0.25  . Smokeless tobacco: Never Used  . Alcohol use No     Allergies   Morphine and related   Review of Systems Review of Systems  Unable to perform ROS: Psychiatric disorder  Constitutional: Negative for appetite change, chills and fever.  HENT: Negative for congestion and sore throat.   Eyes: Negative for visual disturbance.  Respiratory: Negative for cough, choking, chest tightness and shortness of breath.   Cardiovascular: Negative for chest pain, palpitations and leg swelling.  Gastrointestinal: Negative for abdominal pain, constipation, diarrhea, nausea and vomiting.  Genitourinary: Negative for difficulty urinating and hematuria.  Musculoskeletal: Negative for arthralgias.  Skin: Negative for rash and wound.  Neurological: Negative for dizziness, seizures, syncope, weakness, light-headedness, numbness and headaches.  Hematological: Does not bruise/bleed easily.  Psychiatric/Behavioral: Negative.      Physical Exam Updated Vital Signs BP (!) 147/78   Pulse 65   Temp 98.8 F (37.1 C) (Oral)   Resp 17   Ht 5\' 5"  (1.651 m)   Wt 92.5 kg   BMI 33.95 kg/m   Physical Exam  Constitutional: She is oriented to person, place, and time. She appears well-developed and well-nourished.  Patient agitated, poor historian. VS normal. Limited exam  given agitation.   HENT:  Head: Normocephalic and atraumatic.  Right Ear: External ear normal.  Left Ear: External ear normal.  Moist mucous membranes.  No nasal mucosa edema. Sinuses non tender. Oropharynx and tonsils normal without edema, erythema, exudates or lesions.  Uvula midline. No trismus.   Eyes: Conjunctivae are normal. Pupils are equal, round,  and reactive to light.  Neck: Normal range of motion.  Cardiovascular: Normal rate, regular rhythm and normal heart sounds.   Pulmonary/Chest:  RR within normal limits. SpO2 within normal limits.  Normal breathing effort. Patient speaking in full sentences. No pursed lip breathing. Lungs CTAB anteriorly and posteriorly without wheezing, rhonchi or rales.   Abdominal: Soft. There is no tenderness.  Musculoskeletal: Normal range of motion. She exhibits no deformity.  Neurological: She is alert and oriented to person, place, and time. No sensory deficit.  Skin: Skin is warm and dry. Capillary refill takes less than 2 seconds.  Psychiatric:  Pt with manic/fast speech, "i'm going to have my baby", not able to be redirected.   Nursing note and vitals reviewed.   ED Treatments / Results  Labs (all labs ordered are listed, but only abnormal results are displayed) Labs Reviewed  GLUCOSE, CAPILLARY - Abnormal; Notable for the following:       Result Value   Glucose-Capillary 100 (*)    All other components within normal limits  GLUCOSE, CAPILLARY - Abnormal; Notable for the following:    Glucose-Capillary 137 (*)    All other components within normal limits  URINALYSIS, ROUTINE W REFLEX MICROSCOPIC - Abnormal; Notable for the following:    APPearance HAZY (*)    Ketones, ur 5 (*)    All other components within normal limits  CBC WITH DIFFERENTIAL/PLATELET - Abnormal; Notable for the following:    RDW 15.9 (*)    All other components within normal limits  BASIC METABOLIC PANEL - Abnormal; Notable for the following:    Creatinine, Ser 1.01 (*)    All other components within normal limits  URINE CULTURE  URINE CULTURE  TSH  LIPID PANEL  GLUCOSE, CAPILLARY  GLUCOSE, CAPILLARY  RAPID HIV SCREEN (HIV 1/2 AB+AG)  HEMOGLOBIN A1C  PROLACTIN  URINALYSIS, COMPLETE (UACMP) WITH MICROSCOPIC  RPR  CBG MONITORING, ED  I-STAT TROPOININ, ED  GC/CHLAMYDIA PROBE AMP ([place]) NOT AT [facility]     EKG  EKG Interpretation None       Radiology Dg Chest 2 View  Result Date: 03/13/2016 CLINICAL DATA:  Change in mental status EXAM: CHEST  2 VIEW COMPARISON:  10/13/2015 FINDINGS: Mild cardiomegaly, accentuated by low volumes. Mild atelectasis or scarring seen in the lateral projection. There is no edema, consolidation, effusion, or pneumothorax. Symmetric biapical pleural thickening. No acute or aggressive finding. IMPRESSION: 1. Mild cardiomegaly without failure. 2. Mild atelectasis or scarring. Electronically Signed   By: Marnee Spring M.D.   On: 03/13/2016 14:28   Ct Head Wo Contrast  Result Date: 03/13/2016 CLINICAL DATA:  Altered mental status, delirium, schizoaffective disorder EXAM: CT HEAD WITHOUT CONTRAST TECHNIQUE: Contiguous axial images were obtained from the base of the skull through the vertex without intravenous contrast. COMPARISON:  None. FINDINGS: Brain: No evidence of acute infarction, hemorrhage, hydrocephalus, extra-axial collection or mass lesion/mass effect. Partially empty sella, likely incidental in the absence of chronic headaches. Vascular: No hyperdense vessel or unexpected calcification. Skull: Normal. Negative for fracture or focal lesion. Sinuses/Orbits: No acute finding. Other: None. IMPRESSION: No evidence of acute intracranial abnormality. Electronically Signed  By: Charline BillsSriyesh  Krishnan M.D.   On: 03/13/2016 14:43    Procedures Procedures (including critical care time)  Medications Ordered in ED Medications  acetaminophen (TYLENOL) tablet 650 mg (not administered)  alum & mag hydroxide-simeth (MAALOX/MYLANTA) 200-200-20 MG/5ML suspension 30 mL (not administered)  magnesium hydroxide (MILK OF MAGNESIA) suspension 30 mL (not administered)  insulin aspart (novoLOG) injection 0-15 Units (0 Units Subcutaneous Not Given 03/13/16 1330)  insulin aspart (novoLOG) injection 0-5 Units (0 Units Subcutaneous Not Given 03/12/16 2141)  lamoTRIgine (LAMICTAL) tablet 25  mg (25 mg Oral Given 03/13/16 0814)  ziprasidone (GEODON) capsule 20 mg (not administered)    Or  ziprasidone (GEODON) injection 10 mg (not administered)  zolpidem (AMBIEN) tablet 10 mg (not administered)  traZODone (DESYREL) tablet 100 mg (not administered)  paliperidone (INVEGA) 24 hr tablet 6 mg (not administered)  benztropine (COGENTIN) tablet 0.5 mg (not administered)  LORazepam (ATIVAN) tablet 1 mg (1 mg Oral Given 03/12/16 0131)  hydrOXYzine (ATARAX/VISTARIL) tablet 50 mg (50 mg Oral Given 03/12/16 1316)  LORazepam (ATIVAN) tablet 2 mg (2 mg Oral Given 03/12/16 1544)  LORazepam (ATIVAN) tablet 2 mg (2 mg Oral Given 03/13/16 0105)     Initial Impression / Assessment and Plan / ED Course  I have reviewed the triage vital signs and the nursing notes.  Pertinent labs & imaging results that were available during my care of the patient were reviewed by me and considered in my medical decision making (see chart for details).  Clinical Course as of Mar 13 1601  Fri Mar 13, 2016  1538 No leukocytosis. No anemia.  WBC: 9.9 [CG]  1539 Electrolyte, creatinine, GFR and glucose within normal limits. Sodium: 139 [CG]  1539 CBG normal Glucose-Capillary: 94 [CG]  1539 No UTI Nitrite: NEGATIVE [CG]  1539 Negative CXR DG Chest 2 View [CG]  1540 No acute changes in CT head CT Head Wo Contrast [CG]  1540 Afebrile Temp: 98.2 F (36.8 C) [CG]    Clinical Course User Index [CG] Liberty Handylaudia J Kaydyn Chism, PA-C   Pertinent labs placed to r/o medical condition leading to dysuria, cough and sudden deterioration in behavior.  CBC, BMP, U/A, HIV screen, CXR, CBG normal.  Pending urine culture, EKG, troponin, GC/C and RPR.   Pt signed off to oncoming PA who will check pending labs and [facility] assuming all labs return normal.  Final Clinical Impressions(s) / ED Diagnoses   Final diagnoses:  Change in behavior  Agitation    New Prescriptions New Prescriptions   No medications on file      Liberty HandyClaudia J Traeson Dusza, PA-C 03/13/16 1603    Lorre NickAnthony Allen, MD 03/18/16 [phone]

## 2016-03-13 NOTE — Discharge Instructions (Signed)
Work up in the ER was unremarkable. Normal hepatic function testing. Normal ammonia. Normal head CT. Unremarkable chest x-ray. Suspect this is psychiatric related.

## 2016-03-13 NOTE — Progress Notes (Signed)
Patient has been irritable agitated unable to follow redirection.  Patient's speech has been pressured, rapid and nonsensical.  Patient is in much need of constant redirection.  1:1 staff in close proximity to patient. Continue to monitor patient as planned.

## 2016-03-14 DIAGNOSIS — Z818 Family history of other mental and behavioral disorders: Secondary | ICD-10-CM

## 2016-03-14 DIAGNOSIS — Z9889 Other specified postprocedural states: Secondary | ICD-10-CM

## 2016-03-14 DIAGNOSIS — Z794 Long term (current) use of insulin: Secondary | ICD-10-CM

## 2016-03-14 LAB — CBC WITH DIFFERENTIAL/PLATELET
BASOS PCT: 0 %
Basophils Absolute: 0 10*3/uL (ref 0.0–0.1)
EOS ABS: 0.1 10*3/uL (ref 0.0–0.7)
EOS PCT: 1 %
HCT: 36.5 % (ref 36.0–46.0)
HEMOGLOBIN: 12.2 g/dL (ref 12.0–15.0)
LYMPHS ABS: 2.8 10*3/uL (ref 0.7–4.0)
Lymphocytes Relative: 28 %
MCH: 29.4 pg (ref 26.0–34.0)
MCHC: 33.4 g/dL (ref 30.0–36.0)
MCV: 88 fL (ref 78.0–100.0)
MONOS PCT: 7 %
Monocytes Absolute: 0.7 10*3/uL (ref 0.1–1.0)
NEUTROS PCT: 64 %
Neutro Abs: 6.4 10*3/uL (ref 1.7–7.7)
PLATELETS: 364 10*3/uL (ref 150–400)
RBC: 4.15 MIL/uL (ref 3.87–5.11)
RDW: 15.6 % — ABNORMAL HIGH (ref 11.5–15.5)
WBC: 10 10*3/uL (ref 4.0–10.5)

## 2016-03-14 LAB — RPR: RPR: NONREACTIVE

## 2016-03-14 LAB — GLUCOSE, CAPILLARY
GLUCOSE-CAPILLARY: 89 mg/dL (ref 65–99)
GLUCOSE-CAPILLARY: 97 mg/dL (ref 65–99)
GLUCOSE-CAPILLARY: 124 mg/dL — AB (ref 65–99)
GLUCOSE-CAPILLARY: 124 mg/dL — AB (ref 65–99)

## 2016-03-14 LAB — URINE CULTURE

## 2016-03-14 LAB — VITAMIN B12: VITAMIN B 12: 204 pg/mL (ref 180–914)

## 2016-03-14 LAB — FOLATE: Folate: 17 ng/mL (ref >5.9)

## 2016-03-14 LAB — HEMOGLOBIN A1C
HEMOGLOBIN A1C: 5.2 % (ref 4.8–5.6)
MEAN PLASMA GLUCOSE: 103 mg/dL

## 2016-03-14 LAB — PROLACTIN: PROLACTIN: 27.9 ng/mL — AB (ref 4.8–23.3)

## 2016-03-14 MED ORDER — LORAZEPAM 1 MG PO TABS: 2.0000 mg | ORAL_TABLET | Freq: Once | ORAL | Status: AC

## 2016-03-14 MED ORDER — LORAZEPAM 1 MG PO TABS
1.0000 mg | ORAL_TABLET | Freq: Three times a day (TID) | ORAL | Status: DC | PRN
  Filled 2016-03-14: qty 1

## 2016-03-14 MED ORDER — LORAZEPAM 2 MG/ML IJ SOLN
2.0000 mg | Freq: Once | INTRAMUSCULAR | Status: AC
  Administered 2016-03-14: 2 mg via INTRAMUSCULAR

## 2016-03-14 MED ORDER — LORAZEPAM 2 MG/ML IJ SOLN
INTRAMUSCULAR | Status: AC
  Filled 2016-03-14: qty 1

## 2016-03-14 MED ORDER — LORAZEPAM 1 MG PO TABS
1.0000 mg | ORAL_TABLET | Freq: Four times a day (QID) | ORAL | Status: DC | PRN
  Administered 2016-03-14 – 2016-03-15 (×3): 1 mg via ORAL
  Filled 2016-03-14 (×5): qty 1

## 2016-03-14 MED ORDER — LORAZEPAM 2 MG/ML IJ SOLN: 1.0000 mg | Freq: Four times a day (QID) | INTRAMUSCULAR | Status: DC | PRN

## 2016-03-14 NOTE — Progress Notes (Signed)
Patient slept much of afternoon after Ativan prn. Awakened at 5 pm, speech disorganized but patient cooperative and redirectable. Pt cooperated with EKG repeat. States that she still sees and hears demons. 1:1 maintained for patient safety.

## 2016-03-14 NOTE — Progress Notes (Signed)
Requested to evaluate EKG regarding prolonged QTC on patient on antipsychotics, QTc reading on 2/2 is 498 ms, but it appears to be prolonged due to an artifact, so requested repeat EKG, and if QTC is > 460, then would recommend to hold antipsychotic regimen, and substitute with notne QTC prolonging mood stabilizer, will need to  monitor potassium and magnesium closely and replete as needed to keep K >4, and Md >2. Huey Bienenstockawood Elgergawy MD

## 2016-03-14 NOTE — Progress Notes (Signed)
Pt labile, agitated, tearful, pacing at beginning of shift.  She was tangential and disorganized; religiously preoccupied.  She denies SI/HI, reports hallucination of "the [facility]" and denies pain.  Pt reports goal is to "go to [employer] and also work 4 hours a day and give back to the community."  Pt expressed that [facility] multiple times.  She states "if I don't get out tonight, there's gonna be problems."  Pt states "I'm gonna fight you, on candid camera, I don't want no other man, I ain't no ho."  PRN medication administered for anxiety and agitation.  Scheduled medication administered per order.  On-site provider on unit when pt was threatening staff, agitated, pacing, yelling.  Ativan 2 mg PRN ordered.  Pt preferred to take "injection" and IM was administered.  Pt tolerated well.  She states "you my son, I'm Clelia Schaumanngonna give you your money, you're Hulk Leeroy BockHogan Shepler."  Pt is gradually de-escalating since IM administration.  Pt is currently resting in her bed talking quietly to 1:1 staff.  She denies needs and concerns at this time.  Will continue to monitor and assess.

## 2016-03-14 NOTE — Progress Notes (Signed)
Nursing 1:1 note D:Pt observed sitting in bed. RR even and unlabored. No distress noted.Pt continues to talk incoherent at times, disorganized and flight of ideas.   A: 1:1 observation continues for safety  R: pt remains safe

## 2016-03-14 NOTE — Progress Notes (Signed)
D: Patient currently sleeping, has been very hyperverbal and agitated previously. Sitter currently with patient at all times.  A: Staff maintained close contact at all times. Patient encouraged to express emotions and concerns. Verbal support given. Medications as ordered and evaluated for effectiveness.  R: Patient remains safe on 1:1, no self injurious behavior at this time.

## 2016-03-14 NOTE — Progress Notes (Signed)
1:1 note  D: Pt is resting in bed with eyes closed.  Respirations are even and unlabored.  Pt is in no apparent distress.    A: 1:1 observation continues for safety.    R: Pt remains safe.   

## 2016-03-14 NOTE — Progress Notes (Signed)
Nursing 1:1 note D:Pt observed sitting in bed RR even and unlabored. No distress noted.  A: 1:1 observation continues for safety  R: pt remains safe  

## 2016-03-14 NOTE — Progress Notes (Signed)
Psychoeducational Group Note  Date:  03/14/2016 Time:  2110  Group Topic/Focus:  Wrap-Up Group:   The focus of this group is to help patients review their daily goal of treatment and discuss progress on daily workbooks.  Participation Level: Did Not Attend  Participation Quality:  Not Applicable  Affect:  Not Applicable  Cognitive:  Not Applicable  Insight:  Not Applicable  Engagement in Group: Not Applicable  Additional Comments:  The patient did not attend group this evening since she was agitated.   Minetta Krisher S 03/14/2016, 9:10 PM

## 2016-03-14 NOTE — Progress Notes (Signed)
[facility] MD Progress Note  03/14/2016 3:56 PM Catherine Bender  MRN:  [id number]  Subjective: Catherine Bender reports, "I can't tell you anything. I'm trying to to handle things the right way so no one will get hurt".     Objective: Patient seen and chart reviewed. Discussed patient with treatment team.  Pt today seen as labile, restless - unable to participate in an evaluation. Pt unable to take care of ADLs , and is a high fall risk and hence needs 1:1 precaution for safety. Pt did did respond to questions asked, her answers are irrelevant to the questions. Per RN - pt was up all night - restless, crying, not redirectable. Pt per collateral information obtained from daughter has been noncompliant on her medications - was having sexual relationship with someone which also contributed to her decompensation. Will readjust her medications - since abilify made her restless last night - will change to [place]. Will try avoid BZD since it can increase her confusion, however, has to give some Ativan 1 mg today on a prn basis to help patient relax to fall asleep. Result of her recent ekg obtained. Reviewed, result indicated left ventricular hypertrophy, borderline prolonged QT. Has called for primary care consult as she is ordered some antipsychotic meds.".  Principal Problem: Schizoaffective disorder, bipolar type ([place]) : R/O Delirium 2/2 multiple etiologies  Diagnosis:   Patient Active Problem List   Diagnosis Date Noted  . Prolonged Q-T interval on ECG [R94.31] 03/13/2016  . Schizoaffective disorder, bipolar type ([place]) [F25.0] 03/12/2016  . Diabetes mellitus ([place]) [E11.9] 03/12/2016   Total Time spent with patient: 25 minutes  Past Psychiatric History: Please see H&P.   Past Medical History:  Past Medical History:  Diagnosis Date  . Diabetes mellitus without complication [facility])     Past Surgical History:  Procedure Laterality Date  . umbical hernia repair     Family History: Please see H&P.  Family  Psychiatric  History: Please see H&P.  Social History: Please see H&P.  History  Alcohol Use No     History  Drug Use No    Social History   Social History  . Marital status: Single    Spouse name: N/A  . Number of children: N/A  . Years of education: N/A   Social History Main Topics  . Smoking status: Current Every Day Smoker    Packs/day: 0.25  . Smokeless tobacco: Never Used  . Alcohol use No  . Drug use: No  . Sexual activity: Not Asked   Other Topics Concern  . None   Social History Narrative  . None   Additional Social History:                         Sleep: Poor  Appetite:  Fair  Current Medications: Current Facility-Administered Medications  Medication Dose Route Frequency Provider Last Rate Last Dose  . acetaminophen (TYLENOL) tablet 650 mg  650 mg Oral Q6H PRN Laverle Hobby, PA-C      . alum & mag hydroxide-simeth (MAALOX/MYLANTA) 200-200-20 MG/5ML suspension 30 mL  30 mL Oral Q4H PRN Laverle Hobby, PA-C      . benztropine (COGENTIN) tablet 0.5 mg  0.5 mg Oral QHS Saramma Eappen, MD      . insulin aspart (novoLOG) injection 0-15 Units  0-15 Units Subcutaneous TID WC Saramma Eappen, MD      . insulin aspart (novoLOG) injection 0-5 Units  0-5 Units Subcutaneous QHS Saramma Eappen,  MD      . lamoTRIgine (LAMICTAL) tablet 25 mg  25 mg Oral Daily Ursula Alert, MD   25 mg at 03/14/16 0754  . LORazepam (ATIVAN) tablet 1 mg  1 mg Oral TID PRN Encarnacion Slates, NP      . magnesium hydroxide (MILK OF MAGNESIA) suspension 30 mL  30 mL Oral Daily PRN Laverle Hobby, PA-C      . paliperidone (INVEGA) 24 hr tablet 6 mg  6 mg Oral QHS Saramma Eappen, MD      . traZODone (DESYREL) tablet 100 mg  100 mg Oral QHS PRN Ursula Alert, MD      . ziprasidone (GEODON) capsule 20 mg  20 mg Oral TID PRN Ursula Alert, MD   20 mg at 03/14/16 0754   Or  . ziprasidone (GEODON) injection 10 mg  10 mg Intramuscular TID PRN Ursula Alert, MD      . zolpidem (AMBIEN)  tablet 10 mg  10 mg Oral QHS Ursula Alert, MD   10 mg at 03/13/16 2123    Lab Results:  Results for orders placed or performed during the hospital encounter of 03/11/16 (from the past 48 hour(s))  Glucose, capillary     Status: None   Collection Time: 03/12/16  5:40 PM  Result Value Ref Range   Glucose-Capillary 94 65 - 99 mg/dL  Glucose, capillary     Status: Abnormal   Collection Time: 03/12/16  8:37 PM  Result Value Ref Range   Glucose-Capillary 137 (H) 65 - 99 mg/dL  Glucose, capillary     Status: None   Collection Time: 03/13/16  6:08 AM  Result Value Ref Range   Glucose-Capillary 86 65 - 99 mg/dL  TSH     Status: None   Collection Time: 03/13/16  6:26 AM  Result Value Ref Range   TSH 1.227 0.350 - 4.500 uIU/mL    Comment: Performed by a 3rd Generation assay with a functional sensitivity of <=0.01 uIU/mL. Performed at [facility], [place] [address], [place], [place] [address]   Lipid panel     Status: None   Collection Time: 03/13/16  6:26 AM  Result Value Ref Range   Cholesterol 150 0 - 200 mg/dL   Triglycerides 69 <150 mg/dL   HDL 43 >40 mg/dL   Total CHOL/HDL Ratio 3.5 RATIO   VLDL 14 0 - 40 mg/dL   LDL Cholesterol 93 0 - 99 mg/dL    Comment:        Total Cholesterol/HDL:CHD Risk Coronary Heart Disease Risk Table                     Men   Women  1/2 Average Risk   3.4   3.3  Average Risk       5.0   4.4  2 X Average Risk   9.6   7.1  3 X Average Risk  23.4   11.0        Use the calculated Patient Ratio above and the CHD Risk Table to determine the patient's CHD Risk.        ATP III CLASSIFICATION (LDL):  <100     mg/dL   Optimal  100-129  mg/dL   Near or Above                    Optimal  130-159  mg/dL   Borderline  160-189  mg/dL   High  >190  mg/dL   Very High Performed at [facility], [place] [address], [place], [place] [address]   Hemoglobin A1c     Status: None   Collection Time: 03/13/16  6:26 AM  Result Value Ref Range    Hgb A1c MFr Bld 5.2 4.8 - 5.6 %    Comment: (NOTE)         Pre-diabetes: 5.7 - 6.4         Diabetes: >6.4         Glycemic control for adults with diabetes: <7.0    Mean Plasma Glucose 103 mg/dL    Comment: (NOTE) Performed At: [facility] [place], [place] [id number] Lindon Romp MD [id number] Performed at [facility], [place] [address], [place], [place] [address]   Prolactin     Status: Abnormal   Collection Time: 03/13/16  6:26 AM  Result Value Ref Range   Prolactin 27.9 (H) 4.8 - 23.3 ng/mL    Comment: (NOTE) Performed At: [facility] [place], [place] [id number] Lindon Romp MD [id number] Performed at [facility], [place] [address] [place], [place] [address]   Urinalysis, Routine w reflex microscopic- may I&O cath if menses     Status: Abnormal   Collection Time: 03/13/16 12:54 PM  Result Value Ref Range   Color, Urine YELLOW YELLOW   APPearance HAZY (A) CLEAR   Specific Gravity, Urine 1.015 1.005 - 1.030   pH 5.0 5.0 - 8.0   Glucose, UA NEGATIVE NEGATIVE mg/dL   Hgb urine dipstick NEGATIVE NEGATIVE   Bilirubin Urine NEGATIVE NEGATIVE   Ketones, ur 5 (A) NEGATIVE mg/dL   Protein, ur NEGATIVE NEGATIVE mg/dL   Nitrite NEGATIVE NEGATIVE   Leukocytes, UA NEGATIVE NEGATIVE  Urine culture     Status: Abnormal   Collection Time: 03/13/16 12:54 PM  Result Value Ref Range   Specimen Description URINE, RANDOM    Special Requests NONE    Culture MULTIPLE SPECIES PRESENT, SUGGEST RECOLLECTION (A)    Report Status 03/14/2016 FINAL   POC CBG, ED     Status: None   Collection Time: 03/13/16  2:08 PM  Result Value Ref Range   Glucose-Capillary 94 65 - 99 mg/dL  CBC with Differential/Platelet     Status: Abnormal   Collection Time: 03/13/16  2:16 PM  Result Value Ref Range   WBC 9.9 4.0 - 10.5 K/uL   RBC 4.27 3.87 - 5.11 MIL/uL   Hemoglobin 12.4 12.0 - 15.0 g/dL   HCT 38.1  36.0 - 46.0 %   MCV 89.2 78.0 - 100.0 fL   MCH 29.0 26.0 - 34.0 pg   MCHC 32.5 30.0 - 36.0 g/dL   RDW 15.9 (H) 11.5 - 15.5 %   Platelets 328 150 - 400 K/uL   Neutrophils Relative % 67 %   Neutro Abs 6.6 1.7 - 7.7 K/uL   Lymphocytes Relative 25 %   Lymphs Abs 2.5 0.7 - 4.0 K/uL   Monocytes Relative 7 %   Monocytes Absolute 0.7 0.1 - 1.0 K/uL   Eosinophils Relative 1 %   Eosinophils Absolute 0.1 0.0 - 0.7 K/uL   Basophils Relative 0 %   Basophils Absolute 0.0 0.0 - 0.1 K/uL  Basic metabolic panel     Status: Abnormal   Collection Time: 03/13/16  2:16 PM  Result Value Ref Range   Sodium 139 135 - 145 mmol/L   Potassium 4.1 3.5 - 5.1 mmol/L  Chloride 104 101 - 111 mmol/L   CO2 26 22 - 32 mmol/L   Glucose, Bld 85 65 - 99 mg/dL   BUN 11 6 - 20 mg/dL   Creatinine, Ser 1.01 (H) 0.44 - 1.00 mg/dL   Calcium 9.1 8.9 - 10.3 mg/dL   GFR calc non Af Amer >60 >60 mL/min   GFR calc Af Amer >60 >60 mL/min    Comment: (NOTE) The eGFR has been calculated using the CKD EPI equation. This calculation has not been validated in all clinical situations. eGFR's persistently <60 mL/min signify possible Chronic Kidney Disease.    Anion gap 9 5 - 15  RPR     Status: None   Collection Time: 03/13/16  2:16 PM  Result Value Ref Range   RPR Ser Ql Non Reactive Non Reactive    Comment: (NOTE) Performed At: [facility] [place], [place] [id number] Lindon Romp MD [id number]   Rapid HIV screen (HIV 1/2 Ab+Ag)     Status: None   Collection Time: 03/13/16  2:16 PM  Result Value Ref Range   HIV-1 P24 Antigen - HIV24 NON REACTIVE NON REACTIVE   HIV 1/2 Antibodies NON REACTIVE NON REACTIVE   Interpretation (HIV Ag Ab)      A non reactive test result means that HIV 1 or HIV 2 antibodies and HIV 1 p24 antigen were not detected in the specimen.  Hepatic function panel     Status: Abnormal   Collection Time: 03/13/16  2:16 PM  Result Value Ref Range   Total Protein 7.6 6.5 -  8.1 g/dL   Albumin 4.1 3.5 - 5.0 g/dL   AST 34 15 - 41 U/L   ALT 21 14 - 54 U/L   Alkaline Phosphatase 60 38 - 126 U/L   Total Bilirubin 0.5 0.3 - 1.2 mg/dL   Bilirubin, Direct 0.3 0.1 - 0.5 mg/dL   Indirect Bilirubin 0.2 (L) 0.3 - 0.9 mg/dL  Ammonia     Status: None   Collection Time: 03/13/16  4:38 PM  Result Value Ref Range   Ammonia 27 9 - 35 umol/L  I-Stat Troponin, ED (not at [facility])     Status: None   Collection Time: 03/13/16  4:51 PM  Result Value Ref Range   Troponin i, poc 0.00 0.00 - 0.08 ng/mL   Comment 3            Comment: Due to the release kinetics of cTnI, a negative result within the first hours of the onset of symptoms does not rule out myocardial infarction with certainty. If myocardial infarction is still suspected, repeat the test at appropriate intervals.   CBG monitoring, ED     Status: None   Collection Time: 03/13/16  5:43 PM  Result Value Ref Range   Glucose-Capillary 87 65 - 99 mg/dL  Glucose, capillary     Status: Abnormal   Collection Time: 03/13/16  8:40 PM  Result Value Ref Range   Glucose-Capillary 108 (H) 65 - 99 mg/dL  Glucose, capillary     Status: None   Collection Time: 03/14/16  6:01 AM  Result Value Ref Range   Glucose-Capillary 89 65 - 99 mg/dL  CBC with Differential/Platelet     Status: Abnormal   Collection Time: 03/14/16  6:06 AM  Result Value Ref Range   WBC 10.0 4.0 - 10.5 K/uL   RBC 4.15 3.87 - 5.11 MIL/uL   Hemoglobin 12.2 12.0 - 15.0 g/dL  HCT 36.5 36.0 - 46.0 %   MCV 88.0 78.0 - 100.0 fL   MCH 29.4 26.0 - 34.0 pg   MCHC 33.4 30.0 - 36.0 g/dL   RDW 15.6 (H) 11.5 - 15.5 %   Platelets 364 150 - 400 K/uL   Neutrophils Relative % 64 %   Neutro Abs 6.4 1.7 - 7.7 K/uL   Lymphocytes Relative 28 %   Lymphs Abs 2.8 0.7 - 4.0 K/uL   Monocytes Relative 7 %   Monocytes Absolute 0.7 0.1 - 1.0 K/uL   Eosinophils Relative 1 %   Eosinophils Absolute 0.1 0.0 - 0.7 K/uL   Basophils Relative 0 %   Basophils Absolute 0.0 0.0 - 0.1  K/uL  Glucose, capillary     Status: None   Collection Time: 03/14/16 11:45 AM  Result Value Ref Range   Glucose-Capillary 97 65 - 99 mg/dL    Blood Alcohol level:  Lab Results  Component Value Date   ETH <5 03/11/2016   ETH <5 81/11/3157    Metabolic Disorder Labs: Lab Results  Component Value Date   HGBA1C 5.2 03/13/2016   MPG 103 03/13/2016   Lab Results  Component Value Date   PROLACTIN 27.9 (H) 03/13/2016   Lab Results  Component Value Date   CHOL 150 03/13/2016   TRIG 69 03/13/2016   HDL 43 03/13/2016   CHOLHDL 3.5 03/13/2016   VLDL 14 03/13/2016   LDLCALC 93 03/13/2016    Physical Findings: AIMS: Facial and Oral Movements Muscles of Facial Expression: None, normal Lips and Perioral Area: None, normal Jaw: None, normal Tongue: None, normal,Extremity Movements Upper (arms, wrists, hands, fingers): None, normal Lower (legs, knees, ankles, toes): None, normal, Trunk Movements Neck, shoulders, hips: None, normal, Overall Severity Severity of abnormal movements (highest score from questions above): None, normal Incapacitation due to abnormal movements: None, normal Patient's awareness of abnormal movements (rate only patient's report): No Awareness, Dental Status Current problems with teeth and/or dentures?: No Does patient usually wear dentures?: No  CIWA:    COWS:     Musculoskeletal: Strength & Muscle Tone: within normal limits Gait & Station: unsteady Patient leans: N/A  Psychiatric Specialty Exam: Physical Exam  Nursing note and vitals reviewed.   Review of Systems  Unable to perform ROS: Mental acuity    Blood pressure 106/65, pulse (!) 116, temperature 98.8 F (37.1 C), resp. rate 20, height 5' 5"  (1.651 m), weight 92.5 kg (204 lb), SpO2 100 %.Body mass index is 33.95 kg/m.  General Appearance: Disheveled  Eye Contact:  Minimal  Speech:  normal rate - but unable to participate since she keeps crying   Volume:  Increased  Mood:  Depressed  and Dysphoric  Affect:  Labile and Tearful  Thought Process:  Irrelevant and Descriptions of Associations: Tangential  Orientation:  Other:  UTA- appears to oriented to person- but will need to re- evaluate when she is awake  Thought Content:  Delusions, Rumination and Tangential  Suicidal Thoughts:  did not express any   Homicidal Thoughts:  did not express any  Memory:  UTA  Judgement:  Impaired  Insight:  Shallow  Psychomotor Activity:  Restlessness  Concentration:  Concentration: Poor and Attention Span: Poor  Recall:  Poor  Fund of Knowledge:  Poor  Language:  Fair  Akathisia:  No    AIMS (if indicated):     Assets:  Social Support  ADL's:  Impaired  Cognition:  Impaired,  Mild  Sleep:  Number of  Hours: 0.75     Treatment Plan Summary:Patient seen as labile , drowsy , tearful , restless - did not sleep at all last night - is currently on a 1;1 precaution for safety reasons- unable to participate in an evaluation this AM - since she were crying when writer attempted to evaluate and later on was seen as sleeping. Pt will need medication readjustments done - will get labs drawn, will get a UA to R/O medical causes of her presentation , also will repeat CBC - since wbc was slightly elevated ( however she was on Li in the past which could also have contributed to the same.  Schizoaffective disorder, bipolar type ([place]) R/O Delirium due to multiple etiologies  Will continue today 03/14/16 plan as below except where it is noted.   Daily contact with patient to assess and evaluate symptoms and progress in treatment and Medication management  Reviewed past medical records,treatment plan.   For schizoaffective do: Will start Invega xr 6 mg po qhs for mood sx/psychosis. Will add Cogentin 0.5 mg po qhs for EPS. Will continue Lamictal 25 mg po daily.  For R/O delirium: Will reassess. Get labs - tsh, vitamin b12, folate, rpr, ua . Patient with recent Possible cocaine abuse ( per  hx) also has hx of UTI in the past. Will avoid BZD ,medications like benadryl, vistaril which can make her more confused.  Will continue 1:1 precaution for safety reasons.  Will continue to monitor vitals ,medication compliance and treatment side effects while patient is here.   Will monitor for medical issues as well as call consult as needed.   Reviewed labs ,will order as needed.   CSW will continue working on disposition. Will obtain medical records from [facility] - where she was admitted recently.  Patient to participate in therapeutic milieu .        Encarnacion Slates, NP 03/14/2016, 3:56 PMPatient ID: Rockney Ghee, female   DOB: 07-Aug-1970, 46 y.o.   MRN: [id number]

## 2016-03-14 NOTE — [facility] Group Notes (Signed)
[facility] Group Notes: (Clinical Social Work)   03/14/2016      Type of Therapy:  Group Therapy   Participation Level:  Did Not Attend despite MHT prompting   Ambrose MantleMareida Grossman-Orr, LCSW 03/14/2016, 12:58 PM

## 2016-03-14 NOTE — Progress Notes (Signed)
  Patient has been irritable agitated unable to follow redirection.  Patient's speech has been pressured, rapid and nonsensical. Hyperreligious and verbose. Loud, singing.  Patient is in much need of constant redirection.  1:1 staff in close proximity to patient. Continue to monitor patient as planned.

## 2016-03-15 LAB — GLUCOSE, CAPILLARY
Glucose-Capillary: 98 mg/dL (ref 65–99)
Glucose-Capillary: 137 mg/dL — ABNORMAL HIGH (ref 65–99)
Glucose-Capillary: 144 mg/dL — ABNORMAL HIGH (ref 65–99)
Glucose-Capillary: 80 mg/dL (ref 65–99)

## 2016-03-15 LAB — RPR: RPR Ser Ql: NONREACTIVE

## 2016-03-15 LAB — PHOSPHORUS: Phosphorus: 4.3 mg/dL (ref 2.5–4.6)

## 2016-03-15 LAB — MAGNESIUM: Magnesium: 2.1 mg/dL (ref 1.7–2.4)

## 2016-03-15 LAB — POTASSIUM: POTASSIUM: 4.1 mmol/L (ref 3.5–5.1)

## 2016-03-15 MED ORDER — LORAZEPAM 1 MG PO TABS
1.0000 mg | ORAL_TABLET | Freq: Once | ORAL | Status: AC
  Administered 2016-03-15: 1 mg via ORAL

## 2016-03-15 MED ORDER — LORAZEPAM 2 MG/ML IJ SOLN
2.0000 mg | Freq: Four times a day (QID) | INTRAMUSCULAR | Status: DC | PRN
  Administered 2016-03-17 – 2016-03-21 (×3): 2 mg via INTRAMUSCULAR
  Filled 2016-03-15 (×4): qty 1

## 2016-03-15 MED ORDER — OLANZAPINE 10 MG PO TBDP
10.0000 mg | ORAL_TABLET | Freq: Two times a day (BID) | ORAL | Status: DC
  Administered 2016-03-15 – 2016-03-16 (×3): 10 mg via ORAL
  Filled 2016-03-15 (×7): qty 1

## 2016-03-15 MED ORDER — DIVALPROEX SODIUM 500 MG PO DR TAB
500.0000 mg | DELAYED_RELEASE_TABLET | Freq: Three times a day (TID) | ORAL | Status: DC
  Administered 2016-03-15 – 2016-03-17 (×7): 500 mg via ORAL
  Filled 2016-03-15 (×12): qty 1

## 2016-03-15 MED ORDER — LORAZEPAM 1 MG PO TABS
2.0000 mg | ORAL_TABLET | Freq: Four times a day (QID) | ORAL | Status: DC | PRN
  Administered 2016-03-15 – 2016-03-24 (×18): 2 mg via ORAL
  Filled 2016-03-15 (×18): qty 2

## 2016-03-15 NOTE — Progress Notes (Addendum)
1:1 note  D: Pt is resting in bed with eyes closed.  Respirations are even and unlabored.  Pt is in no apparent distress.    A: 1:1 observation continues for safety.    R: Pt remains safe.

## 2016-03-15 NOTE — Progress Notes (Signed)
D: Pt has labile affect and mood.  She remains disorganized and agitated at times.  Continues to require frequent redirection.  She was allowed in the day room in order to prevent her from disrupting peers.  Speech remains tangential and pt has discussed how she adopted her peer and won the lottery.  She continues to ambulate without using her wheelchair.  Gait has been steady tonight.  She paces the hallway at times.    A: Pt remains on 1:1 observation for safety.    R: Pt is safe on the unit.

## 2016-03-15 NOTE — Progress Notes (Signed)
D: Patient currently labile, loud, tangential, impulsive. Unable to complete EKG on patient due to behavior. Preoccupied with discharge, paying bills, cell phone, money. Hyperverbal. Sitter currently maintaining close contact and providing emotional support.  A: Staff maintained close contact at all times. Patient encouraged to express emotions and concerns. Verbal support given. Medications as ordered and evaluated for effectiveness.  R: Patient remains safe on 1:1, no aggressive behavior, no self injurious behavior at this time.

## 2016-03-15 NOTE — Progress Notes (Signed)
1:1 Note  Pt had continued to be agitated and loud for most of the evening. Pt at this time however, is in bed resting with eyes closed. Pt does not look to be in any distress at this time. 1:1 staff is present in room with Pt at this time. 1:1 monitoring continues for Pt's safety. 15-minute safety checks also continues at this time.

## 2016-03-15 NOTE — [facility] Group Notes (Signed)
[facility] Group Notes: (Clinical Social Work)   03/15/2016      Type of Therapy:  Group Therapy   Participation Level:  Did Not Attend - came into the room at the very beginning, was quite agitated and cursing, then left abruptly and did not return.   Ambrose MantleMareida Grossman-Orr, LCSW 03/15/2016, 12:41 PM

## 2016-03-15 NOTE — Progress Notes (Signed)
D: Patient's self inventory sheet: patient has good sleep (did not sleep per night shift report), did not request sleep medication (did receive medications for sleep and agitation).poor  Appetite, hyper energy level, good concentration. Rated depression 0/10, hopeless 5/10, anxiety 0/10. SI/HI/AVH: patient denies all, however does talk about seeing/hearing demons and the holy spirit. Physical complaints are back pain 10/10. Patient remains on 1:1, is very implusive, restless, frequently standing and trying to walk, frequently asking to leave, be discharged, get her belongings and go home. Thought processes and speech are very disorganized and tangential. Mood is extremely labile from crying to euphoria. A: Medications administered, assessed medication knowledge and education given on medication regimen.  Emotional support and encouragement given patient. R: Denies SI and HI , contracts for safety. Safety maintained with 15 minute checks.

## 2016-03-15 NOTE — Progress Notes (Addendum)
[facility] MD Progress Note  03/15/2016 2:02 PM Tomasita Beevers  MRN:  [id number]  Subjective: Skarlet reports, "I'm tired of of not being myself or by myself (crying & restless). I'm ready to go home to see my doctor. He knows the right medicine for me. I died 2 days ago, in my sleep, laying in that bed pointing to the next bed in her room). Only Jesus knows"    Objective: Patient seen with the attending Md, chart reviewed. Discussed patient with treatment team.  Pt today seen as labile, restless, frustrated. Expressed her frustration in not being by herself. She complained of using the wheel chair as she does not want to rely on the wheel chair for the rest of her life. As she tries to express her frustration, she is observed to be tangential as well as circumstantial. She remains paranoia. However, she did listen to the Md & agreed with his recommendations. See MAR. Pt still unable to take care of her ADLs , and is a high fall risk and hence needs 1:1 precaution for safety. Pt did did respond to questions asked, her answers are irrelevant to the questions most times. Per RN - pt was up all night - restless, crying, not redirectable. Pt per collateral information obtained from daughter has been noncompliant on her medications - was having sexual relationship with someone which also contributed to her decompensation. The attending Md has made medication adjustment. She is now Olanzapine Zydis, Ativan 1 mg po or IM prn Q 6 hours. Result of her recent ekg obtained. Reviewed, result indicated left ventricular hypertrophy, borderline prolonged QT. Has called for primary care consult as she is ordered some antipsychotic medication. See consults dated 03-14-16.  Principal Problem: Schizoaffective disorder, bipolar type ([place]) : R/O Delirium 2/2 multiple etiologies  Diagnosis:   Patient Active Problem List   Diagnosis Date Noted  . Prolonged Q-T interval on ECG [R94.31] 03/13/2016  . Schizoaffective disorder,  bipolar type ([place]) [F25.0] 03/12/2016  . Diabetes mellitus ([place]) [E11.9] 03/12/2016   Total Time spent with patient: 25 minutes  Past Psychiatric History: Please see H&P.  Past Medical History:  Past Medical History:  Diagnosis Date  . Diabetes mellitus without complication [facility])     Past Surgical History:  Procedure Laterality Date  . umbical hernia repair     Family History: Please see H&P.  Family Psychiatric  History: Please see H&P.  Social History: Please see H&P.  History  Alcohol Use No     History  Drug Use No    Social History   Social History  . Marital status: Single    Spouse name: N/A  . Number of children: N/A  . Years of education: N/A   Social History Main Topics  . Smoking status: Current Every Day Smoker    Packs/day: 0.25  . Smokeless tobacco: Never Used  . Alcohol use No  . Drug use: No  . Sexual activity: Not Asked   Other Topics Concern  . None   Social History Narrative  . None   Additional Social History:   Sleep: Poor  Appetite:  Fair  Current Medications: Current Facility-Administered Medications  Medication Dose Route Frequency Provider Last Rate Last Dose  . acetaminophen (TYLENOL) tablet 650 mg  650 mg Oral Q6H PRN Laverle Hobby, PA-C      . alum & mag hydroxide-simeth (MAALOX/MYLANTA) 200-200-20 MG/5ML suspension 30 mL  30 mL Oral Q4H PRN Laverle Hobby, PA-C      .  benztropine (COGENTIN) tablet 0.5 mg  0.5 mg Oral QHS Saramma Eappen, MD      . divalproex (DEPAKOTE) DR tablet 500 mg  500 mg Oral TID Artist Beach, MD   500 mg at 03/15/16 1049  . insulin aspart (novoLOG) injection 0-15 Units  0-15 Units Subcutaneous TID WC Ursula Alert, MD   2 Units at 03/15/16 1328  . insulin aspart (novoLOG) injection 0-5 Units  0-5 Units Subcutaneous QHS Saramma Eappen, MD      . lamoTRIgine (LAMICTAL) tablet 25 mg  25 mg Oral Daily Ursula Alert, MD   25 mg at 03/15/16 0755  . LORazepam (ATIVAN) tablet 1 mg  1 mg Oral Q6H  PRN Derrill Center, NP   1 mg at 03/15/16 0945   Or  . LORazepam (ATIVAN) injection 1 mg  1 mg Intramuscular Q6H PRN Derrill Center, NP      . magnesium hydroxide (MILK OF MAGNESIA) suspension 30 mL  30 mL Oral Daily PRN Laverle Hobby, PA-C      . OLANZapine zydis (ZYPREXA) disintegrating tablet 10 mg  10 mg Oral BID Artist Beach, MD   10 mg at 03/15/16 1045  . zolpidem (AMBIEN) tablet 10 mg  10 mg Oral QHS Ursula Alert, MD   10 mg at 03/14/16 2100   Lab Results:  Results for orders placed or performed during the hospital encounter of 03/11/16 (from the past 48 hour(s))  POC CBG, ED     Status: None   Collection Time: 03/13/16  2:08 PM  Result Value Ref Range   Glucose-Capillary 94 65 - 99 mg/dL  CBC with Differential/Platelet     Status: Abnormal   Collection Time: 03/13/16  2:16 PM  Result Value Ref Range   WBC 9.9 4.0 - 10.5 K/uL   RBC 4.27 3.87 - 5.11 MIL/uL   Hemoglobin 12.4 12.0 - 15.0 g/dL   HCT 38.1 36.0 - 46.0 %   MCV 89.2 78.0 - 100.0 fL   MCH 29.0 26.0 - 34.0 pg   MCHC 32.5 30.0 - 36.0 g/dL   RDW 15.9 (H) 11.5 - 15.5 %   Platelets 328 150 - 400 K/uL   Neutrophils Relative % 67 %   Neutro Abs 6.6 1.7 - 7.7 K/uL   Lymphocytes Relative 25 %   Lymphs Abs 2.5 0.7 - 4.0 K/uL   Monocytes Relative 7 %   Monocytes Absolute 0.7 0.1 - 1.0 K/uL   Eosinophils Relative 1 %   Eosinophils Absolute 0.1 0.0 - 0.7 K/uL   Basophils Relative 0 %   Basophils Absolute 0.0 0.0 - 0.1 K/uL  Basic metabolic panel     Status: Abnormal   Collection Time: 03/13/16  2:16 PM  Result Value Ref Range   Sodium 139 135 - 145 mmol/L   Potassium 4.1 3.5 - 5.1 mmol/L   Chloride 104 101 - 111 mmol/L   CO2 26 22 - 32 mmol/L   Glucose, Bld 85 65 - 99 mg/dL   BUN 11 6 - 20 mg/dL   Creatinine, Ser 1.01 (H) 0.44 - 1.00 mg/dL   Calcium 9.1 8.9 - 10.3 mg/dL   GFR calc non Af Amer >60 >60 mL/min   GFR calc Af Amer >60 >60 mL/min    Comment: (NOTE) The eGFR has been calculated using the CKD EPI  equation. This calculation has not been validated in all clinical situations. eGFR's persistently <60 mL/min signify possible Chronic Kidney Disease.    Anion  gap 9 5 - 15  RPR     Status: None   Collection Time: 03/13/16  2:16 PM  Result Value Ref Range   RPR Ser Ql Non Reactive Non Reactive    Comment: (NOTE) Performed At: [facility] [place], [place] [id number] Lindon Romp MD KG:[id number]   Rapid HIV screen (HIV 1/2 Ab+Ag)     Status: None   Collection Time: 03/13/16  2:16 PM  Result Value Ref Range   HIV-1 P24 Antigen - HIV24 NON REACTIVE NON REACTIVE   HIV 1/2 Antibodies NON REACTIVE NON REACTIVE   Interpretation (HIV Ag Ab)      A non reactive test result means that HIV 1 or HIV 2 antibodies and HIV 1 p24 antigen were not detected in the specimen.  Hepatic function panel     Status: Abnormal   Collection Time: 03/13/16  2:16 PM  Result Value Ref Range   Total Protein 7.6 6.5 - 8.1 g/dL   Albumin 4.1 3.5 - 5.0 g/dL   AST 34 15 - 41 U/L   ALT 21 14 - 54 U/L   Alkaline Phosphatase 60 38 - 126 U/L   Total Bilirubin 0.5 0.3 - 1.2 mg/dL   Bilirubin, Direct 0.3 0.1 - 0.5 mg/dL   Indirect Bilirubin 0.2 (L) 0.3 - 0.9 mg/dL  Ammonia     Status: None   Collection Time: 03/13/16  4:38 PM  Result Value Ref Range   Ammonia 27 9 - 35 umol/L  I-Stat Troponin, ED (not at [facility])     Status: None   Collection Time: 03/13/16  4:51 PM  Result Value Ref Range   Troponin i, poc 0.00 0.00 - 0.08 ng/mL   Comment 3            Comment: Due to the release kinetics of cTnI, a negative result within the first hours of the onset of symptoms does not rule out myocardial infarction with certainty. If myocardial infarction is still suspected, repeat the test at appropriate intervals.   CBG monitoring, ED     Status: None   Collection Time: 03/13/16  5:43 PM  Result Value Ref Range   Glucose-Capillary 87 65 - 99 mg/dL  Glucose, capillary     Status: Abnormal    Collection Time: 03/13/16  8:40 PM  Result Value Ref Range   Glucose-Capillary 108 (H) 65 - 99 mg/dL  Vitamin B12     Status: None   Collection Time: 03/14/16  6:00 AM  Result Value Ref Range   Vitamin B-12 204 180 - 914 pg/mL    Comment: (NOTE) This assay is not validated for testing neonatal or myeloproliferative syndrome specimens for Vitamin B12 levels. Performed at [facility], [place] [address] [place], [place] [address]   Glucose, capillary     Status: None   Collection Time: 03/14/16  6:01 AM  Result Value Ref Range   Glucose-Capillary 89 65 - 99 mg/dL  Folate     Status: None   Collection Time: 03/14/16  6:06 AM  Result Value Ref Range   Folate 17.0 >5.9 ng/mL    Comment: Performed at [facility], [place] [address], [place], [place] [address]  CBC with Differential/Platelet     Status: Abnormal   Collection Time: 03/14/16  6:06 AM  Result Value Ref Range   WBC 10.0 4.0 - 10.5 K/uL   RBC 4.15 3.87 - 5.11 MIL/uL   Hemoglobin 12.2 12.0 -  15.0 g/dL   HCT 36.5 36.0 - 46.0 %   MCV 88.0 78.0 - 100.0 fL   MCH 29.4 26.0 - 34.0 pg   MCHC 33.4 30.0 - 36.0 g/dL   RDW 15.6 (H) 11.5 - 15.5 %   Platelets 364 150 - 400 K/uL   Neutrophils Relative % 64 %   Neutro Abs 6.4 1.7 - 7.7 K/uL   Lymphocytes Relative 28 %   Lymphs Abs 2.8 0.7 - 4.0 K/uL   Monocytes Relative 7 %   Monocytes Absolute 0.7 0.1 - 1.0 K/uL   Eosinophils Relative 1 %   Eosinophils Absolute 0.1 0.0 - 0.7 K/uL   Basophils Relative 0 %   Basophils Absolute 0.0 0.0 - 0.1 K/uL  Glucose, capillary     Status: None   Collection Time: 03/14/16 11:45 AM  Result Value Ref Range   Glucose-Capillary 97 65 - 99 mg/dL  Glucose, capillary     Status: Abnormal   Collection Time: 03/14/16  4:53 PM  Result Value Ref Range   Glucose-Capillary 124 (H) 65 - 99 mg/dL  Glucose, capillary     Status: Abnormal   Collection Time: 03/14/16  8:45 PM  Result Value Ref Range   Glucose-Capillary 124 (H) 65 - 99 mg/dL   Glucose, capillary     Status: None   Collection Time: 03/15/16  5:44 AM  Result Value Ref Range   Glucose-Capillary 98 65 - 99 mg/dL  Potassium     Status: None   Collection Time: 03/15/16  6:09 AM  Result Value Ref Range   Potassium 4.1 3.5 - 5.1 mmol/L    Comment: Performed at [facility], [place] [address] [place], [place] [address]  Magnesium     Status: None   Collection Time: 03/15/16  6:09 AM  Result Value Ref Range   Magnesium 2.1 1.7 - 2.4 mg/dL    Comment: Performed at [facility], [place] [address] [place], [place] [address]  Glucose, capillary     Status: Abnormal   Collection Time: 03/15/16 11:48 AM  Result Value Ref Range   Glucose-Capillary 137 (H) 65 - 99 mg/dL   Blood Alcohol level:  Lab Results  Component Value Date   ETH <5 03/11/2016   ETH <5 68/09/8108   Metabolic Disorder Labs: Lab Results  Component Value Date   HGBA1C 5.2 03/13/2016   MPG 103 03/13/2016   Lab Results  Component Value Date   PROLACTIN 27.9 (H) 03/13/2016   Lab Results  Component Value Date   CHOL 150 03/13/2016   TRIG 69 03/13/2016   HDL 43 03/13/2016   CHOLHDL 3.5 03/13/2016   VLDL 14 03/13/2016   LDLCALC 93 03/13/2016   Physical Findings: AIMS: Facial and Oral Movements Muscles of Facial Expression: None, normal Lips and Perioral Area: None, normal Jaw: None, normal Tongue: None, normal,Extremity Movements Upper (arms, wrists, hands, fingers): None, normal Lower (legs, knees, ankles, toes): None, normal, Trunk Movements Neck, shoulders, hips: None, normal, Overall Severity Severity of abnormal movements (highest score from questions above): None, normal Incapacitation due to abnormal movements: None, normal Patient's awareness of abnormal movements (rate only patient's report): No Awareness, Dental Status Current problems with teeth and/or dentures?: No Does patient usually wear dentures?: No  CIWA:    COWS:      Musculoskeletal: Strength & Muscle Tone: within normal limits Gait & Station: unsteady Patient leans: N/A  Psychiatric Specialty Exam: Physical Exam  Nursing note and vitals reviewed.  Review of Systems  Unable to perform ROS: Mental acuity    Blood pressure 114/85, pulse 88, temperature 98.5 F (36.9 C), resp. rate 20, height 5' 5"  (1.651 m), weight 92.5 kg (204 lb), SpO2 97 %.Body mass index is 33.95 kg/m.  General Appearance: Disheveled  Eye Contact:  Minimal  Speech:  normal rate - but unable to participate since she keeps crying   Volume:  Increased  Mood:  Depressed and Dysphoric  Affect:  Labile and Tearful  Thought Process:  Irrelevant and Descriptions of Associations: Tangential  Orientation:  Other:  UTA- appears to oriented to person- but will need to re- evaluate when she is awake  Thought Content:  Delusions, Rumination and Tangential  Suicidal Thoughts:  did not express any   Homicidal Thoughts:  did not express any  Memory:  UTA  Judgement:  Impaired  Insight:  Shallow  Psychomotor Activity:  Restlessness  Concentration:  Concentration: Poor and Attention Span: Poor  Recall:  Poor  Fund of Knowledge:  Poor  Language:  Fair  Akathisia:  No    AIMS (if indicated):     Assets:  Social Support  ADL's:  Impaired  Cognition:  Impaired,  Mild  Sleep:  Number of Hours: 3     Treatment Plan Summary:Patient seen as labile , drowsy , tearful , restless - did not sleep at all last night - is currently on a 1;1 precaution for safety reasons- unable to participate in an evaluation this AM - since she were crying when writer attempted to evaluate and later on was seen as sleeping. Pt will need medication readjustments done - will get labs drawn, will get a UA to R/O medical causes of her presentation , also will repeat CBC - since wbc was slightly elevated ( however she was on Li in the past which could also have contributed to the same.  Schizoaffective disorder,  bipolar type ([place]) R/O Delirium due to multiple etiologies  Will continue today 03/15/16 plan as below except where it is noted.  Daily contact with patient to assess and evaluate symptoms and progress in treatment and Medication management  Reviewed past medical records,treatment plan.   For schizoaffective do: Initiate Zyprexa Zydis disintegrating10 mg twice daily for mood symptoms/psychosis. Discontinue Invega xr 6 mg po q hs.  Will continue Cogentin 0.5 mg po qhs for EPS.  For mood stabilization: Will discontinue Lamictal 25 mg po daily. Start Depakote DR 500 mg tid. Obtain Depakote level in 3 days (03-18-16).  For R/O delirium: Will reassess. Get labs - tsh, result reviewed wnl 1.227, vitamin b12, folate, rpr, ua . Patient with recent Possible cocaine abuse (per hx) also has hx of UTI in the past. U/A reports of 03-13-16 reviewed. Will avoid BZD, medications like Benadryl, Vistaril which can make her more confused. Short acting Ativan 2 mg in use prn for severe anxiety/agitation.  Will continue 1:1 precaution for safety reasons.  Will continue to monitor vitals ,medication compliance and treatment side effects while patient is here.   Will monitor for medical issues as well as call consult as needed.   Reviewed labs ,will order as needed.   CSW will continue working on disposition. Will obtain medical records from [facility] - where she was admitted recently.  Patient to participate in therapeutic milieu .  03-15-16: Reviewed current plan of care. See new medication orders & discontinued meds. Continue tx plan as recommended.  Encarnacion Slates, NP, PMHNP, FNP-BC. 03/15/2016, 2:02 PMPatient  ID: Rockney Ghee, female   DOB: 03-23-1970, 46 y.o.   MRN: [id number]

## 2016-03-15 NOTE — Progress Notes (Signed)
Pt has been up multiple times.  Requires frequent redirection.  She awoke agitated, yelling, using profanity towards 1:1 staff member.  PRN medication for agitation and anxiety provided.  Pt was compliant with medication.  She remains on 1:1 observation.  Pt is safe on the unit.

## 2016-03-15 NOTE — B[facility] Group Notes (Signed)
The focus of this group is to educate the patient on the purpose and policies of crisis stabilization and provide a format to answer questions about their admission.  The group details unit policies and expectations of patients while admitted.  Patient did not attend 0930 nurse education orientation group this morning.  Patient was in hallway with 1:1 present for safety.

## 2016-03-15 NOTE — Progress Notes (Signed)
Patient remains on 1:1 observation. Has been sleeping since 30 minutes before lunch.

## 2016-03-15 NOTE — BHH Counselor (Signed)
Late entry for 03/14/16  CSW received agitated phone call from Rea Collegeastor Louise Jackson [phone] who stated she was in [place].  She reported she is patient's mother and wants the patient's daughter arrested.  She listed wrongdoings by that individual and repeated "I'm tired of it and I want her arrested."  CSW attempted to redirect her numerous times, informed her that she would have to contact the police about any crimes she believed had been committed, offered to help with social work needs.  She remained agitated and eventually was willing to hang up after being given the information about calling back the weekday CSW after patient has started stabilizing.  Ambrose MantleMareida Grossman-Orr, LCSW 03/15/2016, 8:52 AM

## 2016-03-15 NOTE — Progress Notes (Signed)
Pt cooperative with lab draw.  She went to the day room after lab draw and gait was unsteady.  Pt encouraged to use wheelchair.  Much encouragement and redirection needed.  Pt currently using wheelchair in hallway.

## 2016-03-16 LAB — GLUCOSE, CAPILLARY
GLUCOSE-CAPILLARY: 89 mg/dL (ref 65–99)
GLUCOSE-CAPILLARY: 97 mg/dL (ref 65–99)
Glucose-Capillary: 88 mg/dL (ref 65–99)
Glucose-Capillary: 73 mg/dL (ref 65–99)

## 2016-03-16 LAB — GC/CHLAMYDIA PROBE AMP (~~LOC~~) NOT AT ARMC
Chlamydia: NEGATIVE
Neisseria Gonorrhea: NEGATIVE

## 2016-03-16 MED ORDER — LORAZEPAM 2 MG/ML IJ SOLN
2.0000 mg | Freq: Once | INTRAMUSCULAR | Status: AC
  Administered 2016-03-16: 2 mg via INTRAMUSCULAR
  Filled 2016-03-16: qty 1

## 2016-03-16 MED ORDER — OLANZAPINE 5 MG PO TBDP
5.0000 mg | ORAL_TABLET | Freq: Two times a day (BID) | ORAL | Status: DC
  Administered 2016-03-16 – 2016-03-19 (×6): 5 mg via ORAL
  Filled 2016-03-16 (×10): qty 1

## 2016-03-16 NOTE — Progress Notes (Signed)
D: Pt disorganized, tangential with flight of ideas. Loud with pressured speech. Delusional and confused "I want to go home, I have my bills to pay, that lady is coming to pick me up to go pay so I won't loose my house; I work for the [employer] too and they don't want me to say it". Pt observed to be hyperactive with spontaneous running out of her wheel chair, attempting to elope off unit. Tearful, loud and disruptive. Difficult to redirect at this time. Fall precaution continues as ordered, wheel chair in use for assistance with mobility. A: Scheduled medications administered as prescribed. Emotional support and availability provided to pt. 1:1 observation continues as ordered, assigned staff in attendance at all time.  R: Pt is compliant with medications. Demanding d/c. Verbal redirections offered to to no avail as pt continues be uncooperative at present. POC maintained for safety and mood stability.

## 2016-03-16 NOTE — Progress Notes (Signed)
D: Pt became combative with staff this evening; scratched staff on the arm when redirected to use wheel chair and not run. A: Provider made aware of increasing agitation towards others. New order received for Ativan 2 mg IM X1; given at 1618. Continues support and encouragement provided. Pt assisted to bathroom at intervals during shift to assist with care (incontinence, menses). 1:1 observation maintained as ordered. R: Pt is verbally redirectable at present. Observed eating dinner in dayroom and conversing with staff. POC remains effective for safety and mood stability.

## 2016-03-16 NOTE — Progress Notes (Signed)
Call placed to patient's daughter:  Delene Lollarielle [phone])   LCSW discussed with daughter if she has visited patient or talked with her since admission. Daughter reports she has spoken to her over the phone and patient remains not at baseline.  Discussed plans for discharge and where patient will go once stable.  Daughter reports she will probably be homeless as she has been evicted from her boarding house because of aggression.  Reports patient has been kicked out of several housing places including a group home and a day treatment center sometime back in July of 2017.  Reports patient does not comply with her medications and daughter was wanting to know about long term placement in a facility.  LCSW discussed options of ALF and group home, however units are not locked and patient has to be agreeable with placement as she remains her own guardian and facility cannot keep her if she is unwilling.  Daughter reports patient's mother (whom called this week per notes, is also not a good support and the two have lived together, but that will not be an option at this time).  Daughter reports she has allowed patient to live with her, but she has younger children and when patient goes off her medication she scares the children, thus she is not allowed to return.  At baseline, daughter reports patient is independent with all ADLs, walks, does not use a wheelchair and can cook and clean in home.  Daughter reports patient can manage like this for about 6 months, but then stops taking her medication because she feels like she does not need it and then repeates the cycle of admissions.  Reports her [facility] was not successful or helpful as patient had no follow up and was discharged back too soon into the community.  Daughter willing to be part of treatment and feels patient will benefit from placement which LCSW will explore with patient once she is able to come off 1:1 and show  insight.  Deretha EmoryHannah Raheem Kolbe LCSW, MSW Clinical Social Work: [employer]ician, [employer]

## 2016-03-16 NOTE — Progress Notes (Signed)
Recreation Therapy Notes  Date: 03/16/16 Time: 1000 Location: 500 Hall Dayroom  Group Topic: Anger Management  Goal Area(s) Addresses:  Patient will identify triggers for anger.  Patient will identify physical reaction to anger.   Patient will identify benefit of using coping skills when angry.  Intervention: Thermometer worksheet  Activity: Anger Thermometer.  Patients were to rank situations that get anger from 1-10 (1 being calm, 10 being the angriest).  Patients were to also give a small description of the situation.  Once the patients identified the situations that get them angry, they were to come up with coping skills to handle those situations.  Education: Anger Management, Discharge Planning   Education Outcome: Acknowledges education/In group clarification offered/Needs additional education.   Clinical Observations/Feedback: Pt did not attend group.    Caroll RancherMarjette Maryland Luppino, LRT/CTRS          Caroll RancherLindsay, Jamilyn Pigeon A 03/16/2016 1:49 PM

## 2016-03-16 NOTE — Progress Notes (Signed)
1:1 Note  Pt continue to be incontinent of urine and stool-just had another accident in bed. Pt is confused, tangential and religious. Pt in room at this time shouting and agitated. Pt had been medicated. 1:1 staff is present in room with Pt. 1:1 monitoring continues for Pt's safety. 15-minute safety checks also continues at this time.

## 2016-03-16 NOTE — Progress Notes (Signed)
[facility]  03/16/2016 2:25 PM Catherine LimberRenitha Bender  MRN:  [id number] Subjective:  Patient states " I have bills to pay , I need to get out of here.'      Objective:Patient seen and chart reviewed.Discussed patient with treatment team.  Pt seen in wheelchair, has 1:1 sitter with her for safety reasons, fall precaution. Pt continues to be seen as loud often, disruptive in milieu, confused at times requiring redirection. Pt seen as restless, argumentative with her sitter , reports she does not want anyone following her around. Pt lacks insight in to her illness , lacks judgement , cannot participate in treatment or disposition plan at this time.  Per collateral information obtained from daughter per CSW - pt has hx of noncompliance with treatment and will do well if [facility] - however unknown if patient is going to agree to this. Will continue to treat.     Principal Problem: Schizoaffective disorder, bipolar type (HCC)  Diagnosis:   Patient Active Problem List   Diagnosis Date Noted  . Prolonged Q-T interval on ECG [R94.31] 03/13/2016  . Schizoaffective disorder, bipolar type (HCC) [F25.0] 03/12/2016  . Diabetes mellitus (HCC) [E11.9] 03/12/2016   Total Time spent with patient: 25 minutes  Past Psychiatric History: Please see H&P.   Past Medical History:  Past Medical History:  Diagnosis Date  . Diabetes mellitus without complication [facility](HCC)     Past Surgical History:  Procedure Laterality Date  . umbical hernia repair     Family History: Please see H&P.  Family Psychiatric  History: Please see H&P.  Social History: Please see H&P.  History  Alcohol Use No     History  Drug Use No    Social History   Social History  . Marital status: Single    Spouse name: N/A  . Number of children: N/A  . Years of education: N/A   Social History Main Topics  . Smoking status: Current Every Day Smoker    Packs/day: 0.25  . Smokeless tobacco:  Never Used  . Alcohol use No  . Drug use: No  . Sexual activity: Not Asked   Other Topics Concern  . None   Social History Narrative  . None   Additional Social History:                         Sleep: Fair  Appetite:  Fair  Current Medications: Current Facility-Administered Medications  Medication Dose Route Frequency Provider Last Rate Last Dose  . acetaminophen (TYLENOL) tablet 650 mg  650 mg Oral Q6H PRN Kerry HoughSpencer E Simon, PA-C      . alum & mag hydroxide-simeth (MAALOX/MYLANTA) 200-200-20 MG/5ML suspension 30 mL  30 mL Oral Q4H PRN Kerry HoughSpencer E Simon, PA-C      . benztropine (COGENTIN) tablet 0.5 mg  0.5 mg Oral QHS Janautica Netzley, MD   0.5 mg at 03/16/16 0240  . divalproex (DEPAKOTE) DR tablet 500 mg  500 mg Oral TID Georgiann CockerVincent A Izediuno, MD   500 mg at 03/16/16 1148  . insulin aspart (novoLOG) injection 0-15 Units  0-15 Units Subcutaneous TID WC Jomarie LongsSaramma Teona Vargus, MD   2 Units at 03/15/16 1708  . insulin aspart (novoLOG) injection 0-5 Units  0-5 Units Subcutaneous QHS Betta Balla, MD      . LORazepam (ATIVAN) tablet 2 mg  2 mg Oral Q6H PRN Sanjuana KavaAgnes I Nwoko, NP   2 mg at  03/16/16 1148   Or  . LORazepam (ATIVAN) injection 2 mg  2 mg Intramuscular Q6H PRN Sanjuana Kava, NP      . magnesium hydroxide (MILK OF MAGNESIA) suspension 30 mL  30 mL Oral Daily PRN Kerry Hough, PA-C      . OLANZapine zydis (ZYPREXA) disintegrating tablet 5 mg  5 mg Oral BID Jomarie Longs, MD      . zolpidem (AMBIEN) tablet 10 mg  10 mg Oral QHS Jomarie Longs, MD   10 mg at 03/16/16 0241    Lab Results:  Results for orders placed or performed during the hospital encounter of 03/11/16 (from the past 48 hour(s))  Glucose, capillary     Status: Abnormal   Collection Time: 03/14/16  4:53 PM  Result Value Ref Range   Glucose-Capillary 124 (H) 65 - 99 mg/dL  Glucose, capillary     Status: Abnormal   Collection Time: 03/14/16  8:45 PM  Result Value Ref Range   Glucose-Capillary 124 (H) 65 - 99  mg/dL  Glucose, capillary     Status: None   Collection Time: 03/15/16  5:44 AM  Result Value Ref Range   Glucose-Capillary 98 65 - 99 mg/dL  Potassium     Status: None   Collection Time: 03/15/16  6:09 AM  Result Value Ref Range   Potassium 4.1 3.5 - 5.1 mmol/L    Comment: Performed at [facility], [address] [place], [place] [address]  Magnesium     Status: None   Collection Time: 03/15/16  6:09 AM  Result Value Ref Range   Magnesium 2.1 1.7 - 2.4 mg/dL    Comment: Performed at [facility], [address] [place], [place] [address]  Glucose, capillary     Status: Abnormal   Collection Time: 03/15/16 11:48 AM  Result Value Ref Range   Glucose-Capillary 137 (H) 65 - 99 mg/dL  Glucose, capillary     Status: Abnormal   Collection Time: 03/15/16  4:57 PM  Result Value Ref Range   Glucose-Capillary 144 (H) 65 - 99 mg/dL  Phosphorus     Status: None   Collection Time: 03/15/16  6:40 PM  Result Value Ref Range   Phosphorus 4.3 2.5 - 4.6 mg/dL    Comment: Performed at [facility], [address] [place], [place] [address]  Glucose, capillary     Status: None   Collection Time: 03/15/16  8:23 PM  Result Value Ref Range   Glucose-Capillary 80 65 - 99 mg/dL  Glucose, capillary     Status: None   Collection Time: 03/16/16  5:59 AM  Result Value Ref Range   Glucose-Capillary 88 65 - 99 mg/dL  Glucose, capillary     Status: None   Collection Time: 03/16/16 12:00 PM  Result Value Ref Range   Glucose-Capillary 73 65 - 99 mg/dL    Blood Alcohol level:  Lab Results  Component Value Date   ETH <5 03/11/2016   ETH <5 01/05/2016    Metabolic Disorder Labs: Lab Results  Component Value Date   HGBA1C 5.2 03/13/2016   MPG 103 03/13/2016   Lab Results  Component Value Date   PROLACTIN 27.9 (H) 03/13/2016   Lab Results  Component Value Date   CHOL 150 03/13/2016   TRIG 69 03/13/2016   HDL 43 03/13/2016   CHOLHDL  3.5 03/13/2016   VLDL 14 03/13/2016   LDLCALC 93 03/13/2016    Physical Findings:  AIMS: Facial and Oral Movements Muscles of Facial Expression: None, normal Lips and Perioral Area: None, normal Jaw: None, normal Tongue: None, normal,Extremity Movements Upper (arms, wrists, hands, fingers): None, normal Lower (legs, knees, ankles, toes): None, normal, Trunk Movements Neck, shoulders, hips: None, normal, Overall Severity Severity of abnormal movements (highest score from questions above): None, normal Incapacitation due to abnormal movements: None, normal Patient's awareness of abnormal movements (rate only patient's report): No Awareness, Dental Status Current problems with teeth and/or dentures?: No Does patient usually wear dentures?: No  CIWA:    COWS:     Musculoskeletal: Strength & Muscle Tone: within normal limits Gait & Station: unsteady Patient leans: N/A  Psychiatric Specialty Exam: Physical Exam  Nursing note and vitals reviewed.   Review of Systems  Psychiatric/Behavioral: Positive for substance abuse. The patient is nervous/anxious.   All other systems reviewed and are negative.   Blood pressure 108/74, pulse 89, temperature 99.1 F (37.3 C), temperature source Oral, resp. rate 16, height 5\' 5"  (1.651 m), weight 92.5 kg (204 lb), SpO2 97 %.Body mass index is 33.95 kg/m.  General Appearance: Disheveled  Eye Contact:  Minimal  Speech:  Pressured  Volume:  Increased  Mood:  Anxious and Dysphoric  Affect:  Labile and Tearful  Thought Process:  Irrelevant and Descriptions of Associations: Tangential  Orientation:  Other:  to place, situation, self  Thought Content:  Delusions, Rumination and Tangential  Suicidal Thoughts:  No  Homicidal Thoughts:  No  Memory:  Immediate;   Fair Recent;   Poor Remote;   Poor  Judgement:  Impaired  Insight:  Shallow  Psychomotor Activity:  Restlessness  Concentration:  Concentration: Poor and Attention Span: Poor  Recall:   Poor  Fund of Knowledge:  Poor  Language:  Fair  Akathisia:  No    AIMS (if indicated):     Assets:  Others:  access to healthcare  ADL's:  Impaired  Cognition:  WNL  Sleep:  Number of Hours: 4.5     Treatment Plan Summary:Patient today seen as labile, tearful , has 1;1 precaution for safety reasons as well as falls , continues to lack insight and judgement - will continue to educate , support and treat.   Schizoaffective disorder, bipolar type (HCC)   Will continue today 03/16/16 plan as below except where it is noted.   Daily contact with patient to assess and evaluate symptoms and progress in treatment and Medication management  Reviewed past medical records,treatment plan.   For schizoaffective do: Will reduce Zyprexa to 5 mg po bid . Dose reduced due to her persistent QTC prolongation as well as her inability to walk , drowsiness. ( per daughter - at baseline she takes care of self and is better functioning - is not wheelchair bound)   Cogentin 0.5 mg po qhs for EPS. Will continue Depakote DR 500 mg po tid for mood sx. Depakote level on 03/19/16.  For insomnia: Ambien 10 mg po qhs for sleep issues.  For hx of prolonged QTC: Repeat EKG shows qtc prolonged - will reduce zyprexa and continue to monitor.  Will continue 1:1 precaution for safety reasons.  Will continue to monitor vitals ,medication compliance and treatment side effects while patient is here.   Will monitor for medical issues as well as call consult as needed.   Reviewed labs ,will order as needed.   CSW will continue working on disposition. Will obtain medical records from [facility] - where she was admitted recently.  Patient to participate in therapeutic milieu .        Pranit Owensby, MD 03/16/2016, 2:25 PM

## 2016-03-16 NOTE — Progress Notes (Signed)
1:1 Note   Pt continue to be incontinent of urine-just had another accident in bed. Pt is confused, tangential and religious. Pt is in bed talking to staff at this time. 1:1 staff is present in room with Pt. 1:1 monitoring continues for Pt's safety. 15-minute safety checks also continues at this time.

## 2016-03-16 NOTE — Progress Notes (Signed)
D: Pt continues to escalate in her behavior, yelling, disruptive, verbally abusive towards staff when redirection was attempted "you need to leave me the fuck alone, I'm grown, you can't tell me what to do, I don't want to loose my stuff, I have to leave here now". Pt was allowed to make calls but no one answered and she continued to escalate.  A: Scheduled and PRN medications administered as ordered. Continued support and encouragement provided to pt. 1:1 observation continues as ordered.  R: Safety maintained on unit. Pt tolerates all PO intake well. Remains medication compliant but without change in behavior thus far.

## 2016-03-16 NOTE — [facility] Group Notes (Signed)
[facility]  03/16/2016 3:34 PM  Type of Therapy:  Group Therapy  Participation Level:  Did Not Attend    Raye SorrowCoble, Evangelyne Loja N 03/16/2016, 3:34 PM

## 2016-03-17 LAB — GLUCOSE, CAPILLARY
GLUCOSE-CAPILLARY: 117 mg/dL — AB (ref 65–99)
GLUCOSE-CAPILLARY: 98 mg/dL (ref 65–99)
Glucose-Capillary: 105 mg/dL — ABNORMAL HIGH (ref 65–99)
Glucose-Capillary: 95 mg/dL (ref 65–99)

## 2016-03-17 MED ORDER — DIVALPROEX SODIUM 500 MG PO DR TAB
500.0000 mg | DELAYED_RELEASE_TABLET | ORAL | Status: DC
  Administered 2016-03-17 – 2016-03-31 (×41): 500 mg via ORAL
  Filled 2016-03-17 (×47): qty 1

## 2016-03-17 MED ORDER — ESZOPICLONE 2 MG PO TABS
2.0000 mg | ORAL_TABLET | Freq: Every day | ORAL | Status: DC
  Administered 2016-03-17 – 2016-03-18 (×2): 2 mg via ORAL
  Filled 2016-03-17 (×2): qty 1

## 2016-03-17 MED ORDER — NON FORMULARY: 2.0000 mg | Freq: Every day | Status: DC

## 2016-03-17 MED ORDER — GABAPENTIN 300 MG PO CAPS
300.0000 mg | ORAL_CAPSULE | Freq: Every day | ORAL | Status: DC
  Administered 2016-03-17 – 2016-03-18 (×2): 300 mg via ORAL
  Filled 2016-03-17 (×4): qty 1

## 2016-03-17 NOTE — Tx Team (Signed)
Interdisciplinary Treatment and Diagnostic Plan Update  03/17/2016 Time of Session: 9:28 AM  Catherine Bender MRN: [id number]  Principal Diagnosis: Schizoaffective disorder, bipolar type ([place])  Secondary Diagnoses: Principal Problem:   Schizoaffective disorder, bipolar type ([place]) Active Problems:   Prolonged Q-T interval on ECG   Current Medications:  Current Facility-Administered Medications  Medication Dose Route Frequency Provider Last Rate Last Dose  . acetaminophen (TYLENOL) tablet 650 mg  650 mg Oral Q6H PRN Laverle Hobby, PA-C      . alum & mag hydroxide-simeth (MAALOX/MYLANTA) 200-200-20 MG/5ML suspension 30 mL  30 mL Oral Q4H PRN Laverle Hobby, PA-C      . benztropine (COGENTIN) tablet 0.5 mg  0.5 mg Oral QHS Ursula Alert, MD   0.5 mg at 03/16/16 2332  . divalproex (DEPAKOTE) DR tablet 500 mg  500 mg Oral TID Artist Beach, MD   500 mg at 03/17/16 0754  . insulin aspart (novoLOG) injection 0-15 Units  0-15 Units Subcutaneous TID WC Ursula Alert, MD   2 Units at 03/15/16 1708  . insulin aspart (novoLOG) injection 0-5 Units  0-5 Units Subcutaneous QHS Saramma Eappen, MD      . LORazepam (ATIVAN) tablet 2 mg  2 mg Oral Q6H PRN Encarnacion Slates, NP   2 mg at 03/16/16 1148   Or  . LORazepam (ATIVAN) injection 2 mg  2 mg Intramuscular Q6H PRN Encarnacion Slates, NP   2 mg at 03/17/16 0129  . magnesium hydroxide (MILK OF MAGNESIA) suspension 30 mL  30 mL Oral Daily PRN Laverle Hobby, PA-C      . OLANZapine zydis (ZYPREXA) disintegrating tablet 5 mg  5 mg Oral BID Ursula Alert, MD   5 mg at 03/17/16 0754  . zolpidem (AMBIEN) tablet 10 mg  10 mg Oral QHS Ursula Alert, MD   10 mg at 03/16/16 2332    PTA Medications: Prescriptions Prior to Admission  Medication Sig Dispense Refill Last Dose  . LITHIUM PO Take 1 tablet by mouth once.   Not Taking at Unknown time    Treatment Modalities: Medication Management, Group therapy, Case management,  1 to 1 session with clinician,  Psychoeducation, Recreational therapy.   Physician Treatment Plan for Primary Diagnosis: Schizoaffective disorder, bipolar type ([place]) Long Term Goal(s): Improvement in symptoms so as ready for discharge  Short Term Goals: Compliance with prescribed medications will improve  Medication Management: Evaluate patient's response, side effects, and tolerance of medication regimen.  Therapeutic Interventions: 1 to 1 sessions, Unit Group sessions and Medication administration.  Evaluation of Outcomes: Progressing  Physician Treatment Plan for Secondary Diagnosis: Principal Problem:   Schizoaffective disorder, bipolar type ([place]) Active Problems:   Prolonged Q-T interval on ECG   Long Term Goal(s): Improvement in symptoms so as ready for discharge  Short Term Goals: Ability to demonstrate self-control will improve  Medication Management: Evaluate patient's response, side effects, and tolerance of medication regimen.  Therapeutic Interventions: 1 to 1 sessions, Unit Group sessions and Medication administration.  Evaluation of Outcomes: Not Met   RN Treatment Plan for Primary Diagnosis: Schizoaffective disorder, bipolar type ([place]) Long Term Goal(s): Knowledge of disease and therapeutic regimen to maintain health will improve  Short Term Goals: Ability to demonstrate self-control and Compliance with prescribed medications will improve  Medication Management: RN will administer medications as ordered by provider, will assess and evaluate patient's response and provide education to patient for prescribed medication. RN will report any adverse and/or side effects to prescribing  provider.  Therapeutic Interventions: 1 on 1 counseling sessions, Psychoeducation, Medication administration, Evaluate responses to treatment, Monitor vital signs and CBGs as ordered, Perform/monitor CIWA, COWS, AIMS and Fall Risk screenings as ordered, Perform wound care treatments as ordered.  Evaluation of Outcomes:  Not Met   LCSW Treatment Plan for Primary Diagnosis: Schizoaffective disorder, bipolar type (HCC) Long Term Goal(s): Safe transition to appropriate next level of care at discharge, Engage patient in therapeutic group addressing interpersonal concerns.  Short Term Goals: Engage patient in aftercare planning with referrals and resources and Increase skills for wellness and recovery  Therapeutic Interventions: Assess for all discharge needs, 1 to 1 time with Social worker, Explore available resources and support systems, Assess for adequacy in community support network, Educate family and significant other(s) on suicide prevention, Complete Psychosocial Assessment, Interpersonal group therapy.  Evaluation of Outcomes: Not Met   Progress in Treatment: Attending groups: No Participating in groups: No Taking medication as prescribed: Yes, MD continues to assess for medication changes as needed Toleration medication: Yes, no side effects reported at this time Family/Significant other contact made: Yes, Ms. Narielle (daughter, [phone]) Patient understands diagnosis: No, limited insight Discussing patient identified problems/goals with staff: No Medical problems stabilized or resolved: Yes, patient now up and walking out of wheelchair Denies suicidal/homicidal ideation: Yes Issues/concerns per patient self-inventory: None Other: N/A  New problem(s) identified: discharge planning, currently homeless  New Short Term/Long Term Goal(s): Identify a safe discharge plan involving housing.    Discharge Plan or Barriers: Unknown at this time, CSW will continue to follow and assess for options regarding placement  Reason for Continuation of Hospitalization: Anxiety Delusions  Hallucinations Medication stabilization Disorganization    Estimated Length of Stay: 3-5 days  Attendees: Patient: Catherine Bender 03/17/2016  9:28 AM  Physician: Dr. Eappen 03/17/2016  9:28 AM  Nursing: Roni, RN   03/17/2016  9:28 AM  RN Care Manager: Jennifer Clark, CM RN 03/17/2016  9:28 AM  Social Worker: , LCSW 03/17/2016  9:28 AM  Recreational Therapist: Marjette, RT 03/17/2016  9:28 AM  Other:  03/17/2016  9:28 AM  Other:  03/17/2016  9:28 AM  Other: 03/17/2016  9:28 AM    Scribe for Treatment Team:  , LCSW 03/17/2016 9:28 AM  

## 2016-03-17 NOTE — Progress Notes (Signed)
Patient unable to follow staff redirection.  Patient screaming and yelling pacing the hall.  Patient difficult to understand. Patient needs constant redirection. Continue to monitor for safety.  Patient 1:1 staff in close proximity.  

## 2016-03-17 NOTE — Progress Notes (Signed)
Patient ID: Catherine Bender, female   DOB: 05/10/1970, 46 y.o.   MRN: [id number] D: Client visible on the unit, at nurses station and on the phone. Client is jumps from one thought to another reports her identity was stolen and she has to get a new one. Client quotes one bible scripture after another. Client disoriented "it's January 31 st" A: Clinical research associateWriter orients client to time and place. Medications reviewed, administered as ordered. Staff will monitor 1:1 for safety. R: client is safe on the unit, did not attend group.

## 2016-03-17 NOTE — [facility] Group Notes (Signed)
Pt did not attend wrap up group meeting this evening.  

## 2016-03-17 NOTE — Progress Notes (Signed)
Patient unable to follow staff redirection.  Patient screaming and yelling pacing the hall.  Patient difficult to understand. Patient needs constant redirection. Continue to monitor for safety.  Patient 1:1 staff in close proximity.

## 2016-03-17 NOTE — Progress Notes (Signed)
Pt at this time is sitting in bed interacting with 1:1 staff. Pt continues to be confused, paranoid and delusional. 1:1 staff is present and interacting with Pt at this time. 1:1 monitoring continues for Pt's safety.

## 2016-03-17 NOTE — Progress Notes (Signed)
1:1 Note: Pt at this time is in bed resting with eyes closed. Pt does not look to be in any distress at this time. 1:1 staff is present in room with Pt at this time. 1:1 monitoring continues for Pt's safety. 15-minute safety checks also continues at this time. 

## 2016-03-17 NOTE — Progress Notes (Addendum)
[facility] MD Progress Note  03/17/2016 12:31 PM Catherine Bender  MRN:  [id number] Subjective:  Patient states " I want to get out of here. I do not want this lady touch me all the time . I am ready to go.'       Objective:Patient seen and chart reviewed.Discussed patient with treatment team.  Pt today seen as anxious , tearful, labile , depressed , rambling on and on , not redirectable at times. Pt continues to have difficulty being steady when she tries to walk , wears a belt to help her stay stable , and has a wheelchair to keep her safe . Pt continues to be on 1:1 precaution for safety reasons. Pt per RN , did not sleep at all last night. Pt also is often loud and disruptive in milieu. Pt continues to need PRN medications often. 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 Will continue to readjust her other medications.    Principal Problem: Schizoaffective disorder, bipolar type (HCC)  Diagnosis:   Patient Active Problem List   Diagnosis Date Noted  . Prolonged Q-T interval on ECG [R94.31] 03/13/2016  . Schizoaffective disorder, bipolar type (HCC) [F25.0] 03/12/2016  . Diabetes mellitus (HCC) [E11.9] 03/12/2016   Total Time spent with patient: 25 minutes  Past Psychiatric History: Please see H&P.   Past Medical History:  Past Medical History:  Diagnosis Date  . Diabetes mellitus without complication [facility])     Past Surgical History:  Procedure Laterality Date  . umbical hernia repair     Family History: Please see H&P.  Family Psychiatric  History: Please see H&P.  Social History: Please see H&P.  History  Alcohol Use No     History  Drug Use No    Social History   Social History  . Marital status: Single    Spouse name: N/A  . Number of children: N/A  . Years of education: N/A   Social History Main Topics  . Smoking status: Current Every Day Smoker    Packs/day: 0.25   . Smokeless tobacco: Never Used  . Alcohol use No  . Drug use: No  . Sexual activity: Not Asked   Other Topics Concern  . None   Social History Narrative  . None   Additional Social History:                         Sleep: Poor  Appetite:  Fair  Current Medications: Current Facility-Administered Medications  Medication Dose Route Frequency Provider Last Rate Last Dose  . acetaminophen (TYLENOL) tablet 650 mg  650 mg Oral Q6H PRN Kerry Hough, PA-C      . alum & mag hydroxide-simeth (MAALOX/MYLANTA) 200-200-20 MG/5ML suspension 30 mL  30 mL Oral Q4H PRN Kerry Hough, PA-C      . benztropine (COGENTIN) tablet 0.5 mg  0.5 mg Oral QHS Jomarie Longs, MD   0.5 mg at 03/16/16 2332  . divalproex (DEPAKOTE) DR tablet 500 mg  500 mg Oral [id number] Coltyn Hanning, MD      . gabapentin (NEURONTIN) capsule 300 mg  300 mg Oral QHS Rolla Kedzierski, MD      . insulin aspart (novoLOG) injection 0-15 Units  0-15 Units Subcutaneous TID WC Jomarie Longs, MD   2 Units at 03/15/16 1708  . insulin aspart (novoLOG) injection 0-5 Units  0-5 Units  Subcutaneous QHS Jomarie LongsSaramma Pate Aylward, MD      . LORazepam (ATIVAN) tablet 2 mg  2 mg Oral Q6H PRN Sanjuana KavaAgnes I Nwoko, NP   2 mg at 03/17/16 0957   Or  . LORazepam (ATIVAN) injection 2 mg  2 mg Intramuscular Q6H PRN Sanjuana KavaAgnes I Nwoko, NP   2 mg at 03/17/16 0129  . magnesium hydroxide (MILK OF MAGNESIA) suspension 30 mL  30 mL Oral Daily PRN Kerry HoughSpencer E Simon, PA-C      . NON FORMULARY 2 mg  2 mg Oral QHS Pecola Haxton, MD      . OLANZapine zydis (ZYPREXA) disintegrating tablet 5 mg  5 mg Oral BID Jomarie LongsSaramma Jahleah Mariscal, MD   5 mg at 03/17/16 [id number]    Lab Results:  Results for orders placed or performed during the hospital encounter of 03/11/16 (from the past 48 hour(s))  Glucose, capillary     Status: Abnormal   Collection Time: 03/15/16  4:57 PM  Result Value Ref Range   Glucose-Capillary 144 (H) 65 - 99 mg/dL  Phosphorus     Status: None   Collection Time:  03/15/16  6:40 PM  Result Value Ref Range   Phosphorus 4.3 2.5 - 4.6 mg/dL    Comment: Performed at [facility], [address] [place], [place] [id number]  Glucose, capillary     Status: None   Collection Time: 03/15/16  8:23 PM  Result Value Ref Range   Glucose-Capillary 80 65 - 99 mg/dL  Glucose, capillary     Status: None   Collection Time: 03/16/16  5:59 AM  Result Value Ref Range   Glucose-Capillary 88 65 - 99 mg/dL  Glucose, capillary     Status: None   Collection Time: 03/16/16 12:00 PM  Result Value Ref Range   Glucose-Capillary 73 65 - 99 mg/dL  Glucose, capillary     Status: None   Collection Time: 03/16/16  4:48 PM  Result Value Ref Range   Glucose-Capillary 89 65 - 99 mg/dL   Comment 1 Notify RN    Comment 2 Document in Chart   Glucose, capillary     Status: None   Collection Time: 03/16/16  8:43 PM  Result Value Ref Range   Glucose-Capillary 97 65 - 99 mg/dL  Glucose, capillary     Status: Abnormal   Collection Time: 03/17/16  5:52 AM  Result Value Ref Range   Glucose-Capillary 105 (H) 65 - 99 mg/dL  Glucose, capillary     Status: Abnormal   Collection Time: 03/17/16 11:58 AM  Result Value Ref Range   Glucose-Capillary 117 (H) 65 - 99 mg/dL   Comment 1 Notify RN     Blood Alcohol level:  Lab Results  Component Value Date   ETH <5 03/11/2016   ETH <5 01/05/2016    Metabolic Disorder Labs: Lab Results  Component Value Date   HGBA1C 5.2 03/13/2016   MPG 103 03/13/2016   Lab Results  Component Value Date   PROLACTIN 27.9 (H) 03/13/2016   Lab Results  Component Value Date   CHOL 150 03/13/2016   TRIG 69 03/13/2016   HDL 43 03/13/2016   CHOLHDL 3.5 03/13/2016   VLDL 14 03/13/2016   LDLCALC 93 03/13/2016    Physical Findings: AIMS: Facial and Oral Movements Muscles of Facial Expression: None, normal Lips and Perioral Area: None, normal Jaw: None, normal Tongue: None, normal,Extremity Movements Upper (arms, wrists, hands,  fingers): None, normal Lower (legs, knees, ankles, toes): None, normal,  Trunk Movements Neck, shoulders, hips: None, normal, Overall Severity Severity of abnormal movements (highest score from questions above): None, normal Incapacitation due to abnormal movements: None, normal Patient's awareness of abnormal movements (rate only patient's report): No Awareness, Dental Status Current problems with teeth and/or dentures?: No Does patient usually wear dentures?: No  CIWA:    COWS:     Musculoskeletal: Strength & Muscle Tone: within normal limits Gait & Station: unsteady Patient leans: N/A  Psychiatric Specialty Exam: Physical Exam  Nursing note and vitals reviewed.   Review of Systems  Psychiatric/Behavioral: Positive for substance abuse. The patient is nervous/anxious and has insomnia.   All other systems reviewed and are negative.   Blood pressure (!) 139/92, pulse 80, temperature 98.6 F (37 C), temperature source Oral, resp. rate 17, height 5\' 5"  (1.651 m), weight 92.5 kg (204 lb), SpO2 97 %.Body mass index is 33.95 kg/m.  General Appearance: Guarded  Eye Contact:  Minimal  Speech:  Pressured  Volume:  Increased  Mood:  Anxious and Dysphoric  Affect:  Labile and Tearful  Thought Process:  Irrelevant and Descriptions of Associations: Tangential  Orientation:  Other:  to place, situation, self  Thought Content:  Delusions, Rumination and Tangential  Suicidal Thoughts:  No  Homicidal Thoughts:  No  Memory:  Immediate;   Fair Recent;   Poor Remote;   Poor  Judgement:  Impaired  Insight:  Shallow  Psychomotor Activity:  Restlessness  Concentration:  Concentration: Poor and Attention Span: Poor  Recall:  Poor  Fund of Knowledge:  Poor  Language:  Fair  Akathisia:  No    AIMS (if indicated):     Assets:  Others:  access to healthcare  ADL's:  Impaired  Cognition:  WNL  Sleep:  Number of Hours: 1     Treatment Plan Summary:Patient today seen as labile, tearful,  intrusive , continues to be on 1: 1 precaution , has sleep issues . Pt with hx of QTC prolongation - will readjust her medications , continue EKG monitoring.   Schizoaffective disorder, bipolar type (HCC) unstable  Will continue today 03/17/16 plan as below except where it is noted.   Daily contact with patient to assess and evaluate symptoms and progress in treatment and Medication management  Reviewed past medical records,treatment plan.   For schizoaffective do: Reduced  Zyprexa to 5 mg po bid . Dose reduced due to her persistent QTC prolongation as well as her inability to walk , drowsiness. ( per daughter - at baseline she takes care of self and is better functioning - is not wheelchair bound) . Will repeat EKG today ( 03/17/16).  Cogentin 0.5 mg po qhs for EPS. Will continue Depakote DR 500 mg po tid for mood sx. Depakote level on 03/19/16. Will add Neurontin 300 mg po qhs for anxiety/restlessness.  For insomnia: Discontinue Ambien due to lack of efficacy . Will start Lunesta 2 mg po qhs for sleep.  For hx of prolonged QTC: Repeat EKG showed qtc prolonged -  reduced zyprexa and continue to monitor. Will order repeat EKG today.  Will continue 1:1 precaution for safety reasons.  Will continue to monitor vitals ,medication compliance and treatment side effects while patient is here.   Will monitor for medical issues as well as call consult as needed.   Reviewed labs ,will order as needed.   CSW will continue working on disposition. Will obtain medical records from [facility] - where she was admitted recently.CSW will work on placement -  per daughter pt cannot return to her place and will do better with some structure and supervision. Pt has chronic hx of noncompliance with treatment.  Patient to participate in therapeutic milieu .        Celsa Nordahl, MD 03/17/2016, 12:31 PM   Addendum 03/17/16 3;16 PM EKG reviewed - qtc improving - but likely due to  reducing zyprexa and avoiding other offending agents. Will continue to monitor.  Jomarie Longs ,MD Attending Psychiatrist  [facility]

## 2016-03-17 NOTE — Progress Notes (Signed)
1:1 Note   Pt continues to be loud, confused, tangential and religious. Pt in room at this time shouting and agitated. Pt had been medicated. 1:1 staff is present in room with Pt. 1:1 monitoring continues for Pt's safety. 15-minute safety checks also continues at this time.

## 2016-03-17 NOTE — Progress Notes (Signed)
Recreation Therapy Notes  Date: 03/17/16 Time: 1000 Location: 500 Hall Dayroom  Group Topic: Self-Expression  Goal Area(s) Addresses:  Patient will identify positive ways to express themselves. Patient will verbalize benefit of self-expression.  Intervention: Holiday representativeConstruction paper, scissors, glue sticks, magazines  Activity: Collage.  Patients were given construction paper, scissors, glue sticks and magazines.  Patients were to find pictures and words to describe things they like and gives a scene of who they are as a person.  Education: Self-Expression, Building control surveyorDischarge Planning.   Education Outcome: Acknowledges education/In group clarification offered/Needs additional education  Clinical Observations/Feedback: Pt did not attend group.   [name], Catherine Bender         Lillia AbedLindsay, [name] 03/17/2016 1:24 PM

## 2016-03-18 LAB — GLUCOSE, CAPILLARY
GLUCOSE-CAPILLARY: 88 mg/dL (ref 65–99)
GLUCOSE-CAPILLARY: 76 mg/dL (ref 65–99)
Glucose-Capillary: 102 mg/dL — ABNORMAL HIGH (ref 65–99)
Glucose-Capillary: 89 mg/dL (ref 65–99)

## 2016-03-18 LAB — VALPROIC ACID LEVEL: Valproic Acid Lvl: 83 ug/mL (ref 50.0–100.0)

## 2016-03-18 MED ORDER — NICOTINE POLACRILEX 2 MG MT GUM: 2.0000 mg | CHEWING_GUM | OROMUCOSAL | Status: DC | PRN

## 2016-03-18 NOTE — Progress Notes (Signed)
Observation note: Pt observed sitting up in bed talking to herself. Wheelchair within reach. Gait belt applied. Pt sitter at bedside. Pt remains on 1:1 observation for safety.

## 2016-03-18 NOTE — Progress Notes (Signed)
Observation note 1800: pt observed sleeping in bed at this time. Pt do not appear to be in distress. Pt sitter at bedside. Pt wheelchair within reach. Pt remains on 1:1 observation for safety.

## 2016-03-18 NOTE — [facility] Group Notes (Signed)
[facility] LCSW Group Therapy  03/18/2016 2:28 PM   Type of Therapy:  Group Therapy   Participation Level:  Engaged  Participation Quality:  Attentive  Affect:  Appropriate   Cognitive:  Alert   Insight:  Engaged  Engagement in Therapy:  Improving   Modes of Intervention:  Education, Exploration, Socialization   Summary of Progress/Problems: Catherine Bender joined the group session, accompanied by her MHT. She was in and out of group, then eventually left and did not return.   [name] from [employer] was here to tell his story of recovery, inform patients about [employer] and play his guitar.   Baldo DaubJolan Crystalyn Delia 03/18/2016 2:28 PM

## 2016-03-18 NOTE — [facility] Counselor (Addendum)
CSW intern attempted to contact patient's daughter Catherine Bender [phone]) to discuss possible discharge placements. No answer and the phone continuously stated that the line was busy. CSW intern also attempted to contact the patient's other daughter Catherine Bender [phone]). Catherine Bender answered the phone, but once CSW intern introduced herself the line disconnected. CSW tried calling back but was automatically sent to voicemail twice. Could not leave message. CSW will continue to follow and assess for further options.

## 2016-03-18 NOTE — Progress Notes (Addendum)
[facility] MD Progress Note  03/18/2016 12:47 PM Catherine Bender  MRN:  [id number] Subjective:  Patient states " I want to get my SSI benefits back, people are trying steal them from me . 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 I want to get out of here.'       Objective:Patient seen and chart reviewed.Discussed patient with treatment team.  Pt today seen as rambling on and on , talking about her concerns about her family trying to steal from her. Pt seen as labile, tearful at times , pressured , anxious. Pt continues to be on 1:1 precaution for being disorganized , intrusive and being high fall risk. Discussed disposition plan with pt , including referral to ALF , day program. Pt reports she is motivated to do so. CSW to start working on the same. Continue to encourage and support.   Principal Problem: Schizoaffective disorder, bipolar type (HCC)  Diagnosis:   Patient Active Problem List   Diagnosis Date Noted  . Prolonged Q-T interval on ECG [R94.31] 03/13/2016  . Schizoaffective disorder, bipolar type (HCC) [F25.0] 03/12/2016  . Diabetes mellitus (HCC) [E11.9] 03/12/2016   Total Time spent with patient: 25 minutes  Past Psychiatric History: Please see H&P.   Past Medical History:  Past Medical History:  Diagnosis Date  . Diabetes mellitus without complication [facility])     Past Surgical History:  Procedure Laterality Date  . umbical hernia repair     Family History: Please see H&P.  Family Psychiatric  History: Please see H&P.  Social History: Please see H&P.  History  Alcohol Use No     History  Drug Use No    Social History   Social History  . Marital status: Single    Spouse name: N/A  . Number of children: N/A  . Years of education: N/A   Social History Main Topics  . Smoking status: Current Every Day Smoker    Packs/day: 0.25  . Smokeless tobacco: Never Used  . Alcohol use No  . Drug use: No  . Sexual activity: Not Asked   Other Topics  Concern  . None   Social History Narrative  . None   Additional Social History:                         Sleep: improving  Appetite:  Fair  Current Medications: Current Facility-Administered Medications  Medication Dose Route Frequency Provider Last Rate Last Dose  . acetaminophen (TYLENOL) tablet 650 mg  650 mg Oral Q6H PRN Kerry Hough, PA-C      . alum & mag hydroxide-simeth (MAALOX/MYLANTA) 200-200-20 MG/5ML suspension 30 mL  30 mL Oral Q4H PRN Kerry Hough, PA-C      . benztropine (COGENTIN) tablet 0.5 mg  0.5 mg Oral QHS Jomarie Longs, MD   0.5 mg at 03/17/16 2054  . divalproex (DEPAKOTE) DR tablet 500 mg  500 mg Oral [id number] Jomarie Longs, MD   500 mg at 03/18/16 0738  . eszopiclone (LUNESTA) tablet 2 mg  2 mg Oral QHS Jomarie Longs, MD   2 mg at 03/17/16 2053  . gabapentin (NEURONTIN) capsule 300 mg  300 mg Oral QHS Jomarie Longs, MD   300 mg at 03/17/16 2053  . insulin aspart (novoLOG) injection 0-15 Units  0-15 Units Subcutaneous TID WC Jomarie Longs, MD   2 Units at 03/15/16 1708  . insulin aspart (novoLOG) injection 0-5 Units  0-5 Units Subcutaneous QHS Cornelious Bartolucci, MD      . LORazepam (ATIVAN) tablet 2 mg  2 mg Oral Q6H PRN Sanjuana Kava, NP   2 mg at 03/18/16 1610   Or  . LORazepam (ATIVAN) injection 2 mg  2 mg Intramuscular Q6H PRN Sanjuana Kava, NP   2 mg at 03/18/16 0911  . magnesium hydroxide (MILK OF MAGNESIA) suspension 30 mL  30 mL Oral Daily PRN Kerry Hough, PA-C      . OLANZapine zydis (ZYPREXA) disintegrating tablet 5 mg  5 mg Oral BID Jomarie Longs, MD   5 mg at 03/18/16 9604    Lab Results:  Results for orders placed or performed during the hospital encounter of 03/11/16 (from the past 48 hour(s))  Glucose, capillary     Status: None   Collection Time: 03/16/16  4:48 PM  Result Value Ref Range   Glucose-Capillary 89 65 - 99 mg/dL   Comment 1 Notify RN    Comment 2 Document in Chart   Glucose, capillary     Status: None    Collection Time: 03/16/16  8:43 PM  Result Value Ref Range   Glucose-Capillary 97 65 - 99 mg/dL  Glucose, capillary     Status: Abnormal   Collection Time: 03/17/16  5:52 AM  Result Value Ref Range   Glucose-Capillary 105 (H) 65 - 99 mg/dL  Glucose, capillary     Status: Abnormal   Collection Time: 03/17/16 11:58 AM  Result Value Ref Range   Glucose-Capillary 117 (H) 65 - 99 mg/dL   Comment 1 Notify RN   Glucose, capillary     Status: None   Collection Time: 03/17/16  5:27 PM  Result Value Ref Range   Glucose-Capillary 95 65 - 99 mg/dL  Glucose, capillary     Status: None   Collection Time: 03/17/16  8:48 PM  Result Value Ref Range   Glucose-Capillary 98 65 - 99 mg/dL   Comment 1 Notify RN   Glucose, capillary     Status: None   Collection Time: 03/18/16  5:48 AM  Result Value Ref Range   Glucose-Capillary 88 65 - 99 mg/dL  Valproic acid level     Status: None   Collection Time: 03/18/16  6:25 AM  Result Value Ref Range   Valproic Acid Lvl 83 50.0 - 100.0 ug/mL    Comment: Performed at [facility], [address] [place], [place] [address]  Glucose, capillary     Status: None   Collection Time: 03/18/16 12:08 PM  Result Value Ref Range   Glucose-Capillary 76 65 - 99 mg/dL   Comment 1 Notify RN    Comment 2 Document in Chart     Blood Alcohol level:  Lab Results  Component Value Date   ETH <5 03/11/2016   ETH <5 01/05/2016    Metabolic Disorder Labs: Lab Results  Component Value Date   HGBA1C 5.2 03/13/2016   MPG 103 03/13/2016   Lab Results  Component Value Date   PROLACTIN 27.9 (H) 03/13/2016   Lab Results  Component Value Date   CHOL 150 03/13/2016   TRIG 69 03/13/2016   HDL 43 03/13/2016   CHOLHDL 3.5 03/13/2016   VLDL 14 03/13/2016   LDLCALC 93 03/13/2016    Physical Findings: AIMS: Facial and Oral Movements Muscles of Facial Expression: None, normal Lips and Perioral Area: None, normal Jaw: None, normal Tongue: None,  normal,Extremity Movements Upper (arms, wrists, hands, fingers):  None, normal Lower (legs, knees, ankles, toes): None, normal, Trunk Movements Neck, shoulders, hips: None, normal, Overall Severity Severity of abnormal movements (highest score from questions above): None, normal Incapacitation due to abnormal movements: None, normal Patient's awareness of abnormal movements (rate only patient's report): No Awareness, Dental Status Current problems with teeth and/or dentures?: No Does patient usually wear dentures?: No  CIWA:    COWS:     Musculoskeletal: Strength & Muscle Tone: within normal limits Gait & Station: unsteady Patient leans: N/A  Psychiatric Specialty Exam: Physical Exam  Nursing note and vitals reviewed.   Review of Systems  Psychiatric/Behavioral: Positive for depression and substance abuse. The patient is nervous/anxious and has insomnia.   All other systems reviewed and are negative.   Blood pressure (!) 135/91, pulse 80, temperature 98.7 F (37.1 C), temperature source Oral, resp. rate 16, height 5\' 5"  (1.651 m), weight 92.5 kg (204 lb), SpO2 97 %.Body mass index is 33.95 kg/m.  General Appearance: Guarded  Eye Contact:  Fair  Speech:  Pressured  Volume:  Increased  Mood:  Anxious, Dysphoric and Irritable  Affect:  Labile and Tearful  Thought Process:  Irrelevant and Descriptions of Associations: Tangential  Orientation:  Other:  to place, situation, self  Thought Content:  Delusions, Paranoid Ideation, Rumination and Tangential  Suicidal Thoughts:  No  Homicidal Thoughts:  No  Memory:  Immediate;   Fair Recent;   Poor Remote;   Poor  Judgement:  Impaired  Insight:  Shallow  Psychomotor Activity:  Restlessness  Concentration:  Concentration: Poor and Attention Span: Poor  Recall:  Poor  Fund of Knowledge:  Poor  Language:  Fair  Akathisia:  No    AIMS (if indicated):     Assets:  Others:  access to healthcare  ADL's:  Impaired  Cognition:  WNL   Sleep:  Number of Hours: 4.25     Treatment Plan Summary:Patient today seen as labile , pressured , rambling on and on about her several stressors , continues to need 1;1 precaution , continue to readjust medications .  Schizoaffective disorder, bipolar type (HCC) unstable Will continue today 03/18/16 plan as below except where it is noted.   Daily contact with patient to assess and evaluate symptoms and progress in treatment and Medication management Reviewed past medical records,treatment plan.   For schizoaffective do: Reduced  Zyprexa to 5 mg po bid . Dose reduced due to her persistent QTC prolongation as well as her inability to walk , drowsiness. ( per daughter - at baseline she takes care of self and is better functioning - is not wheelchair bound) . Repeat EKG ( 03/17/16)- shows qtc- improving - however likely due to dose reduction of offending agent. .  Cogentin 0.5 mg po qhs for EPS. Will continue Depakote DR 500 mg po tid for mood sx. Depakote level on 03/19/16. Will continue Neurontin 300 mg po qhs for anxiety/restlessness.  For insomnia: Discontinue Ambien due to lack of efficacy . Will continue Lunesta 2 mg po qhs for sleep.  For hx of prolonged QTC: Repeat EKG showed qtc improving - will continue to monitor.   Will continue 1:1 precaution for safety reasons.  Will continue to monitor vitals ,medication compliance and treatment side effects while patient is here.   Will monitor for medical issues as well as call consult as needed.   Reviewed labs ,will order as needed.   CSW will continue working on disposition. Patient to be referred to ALF /day program.  Patient to participate in therapeutic milieu .        Catherine Rosengrant, MD 03/18/2016, 12:47 PM

## 2016-03-18 NOTE — Progress Notes (Signed)
Observation note at 10:00 am: Pt presents with disorganized thoughts, tangential speech, crying spells, constant worrying and agitation. Pt preoccupied this morning with flight of ideas and paranoia. Pt aggressive and times and hard to redirect. Pt remains on 1:1 observation for safety.

## 2016-03-18 NOTE — Progress Notes (Addendum)
Recreation Therapy Notes   Date: 03/18/16 Time: 1000 Location: 500 Hall Dayroom  Group Topic: Wellness  Goal Area(s) Addresses:  Patient will define components of whole wellness. Patient will verbalize benefit of whole wellness.  Behavioral Response: None  Intervention:  [employer] ball, chairs  Activity: Seated volleyball.  Patients were broken up into two teams.  Patients were seated across the room from each other.  Patients scored points by getting the ball past the other team.  The first team to reach 20 points was the winner.   Education: Wellness, Building control surveyorDischarge Planning.   Education Outcome: Acknowledges education/In group clarification offered/Needs additional education.   Clinical Observations/Feedback:  Pt arrived late and [facility] and the she left her purse in the trunk.  Pt left and did not return.   Caroll RancherMarjette Hall Birchard, LRT/CTRS      Caroll RancherLindsay, Napolean Sia A 03/18/2016 11:36 AM

## 2016-03-18 NOTE — [place] FL2 (Signed)
[place] MEDICAID FL2 LEVEL OF CARE SCREENING TOOL     IDENTIFICATION  Patient Name: Catherine LimberRenitha Leatherbury Birthdate: 03/01/1970 Sex: female Admission Date (Current Location): 03/11/2016  [facility] [place]   Facility and Address:  [facility], [address], [place], [place] [address]      Provider Number: [id number]  Attending Physician Name and Address:  No att. providers found  Relative Name and Phone Number:       Current Level of Care: Hospital Recommended Level of Care: Family Care Home Prior Approval Number:    Date Approved/Denied:   PASRR Number:    Discharge Plan: Domiciliary (Rest home)    Current Diagnoses: Patient Active Problem List   Diagnosis Date Noted  . Prolonged Q-T interval on ECG 03/13/2016  . Schizoaffective disorder, bipolar type (HCC) 03/12/2016  . Diabetes mellitus (HCC) 03/12/2016    Orientation RESPIRATION BLADDER Height & Weight     Self, Situation, Place  Normal Continent Weight: 204 lb (92.5 kg) Height:  5\' 5"  (165.1 cm)  BEHAVIORAL SYMPTOMS/MOOD NEUROLOGICAL BOWEL NUTRITION STATUS      Continent Diet (See D/C summary )  AMBULATORY STATUS COMMUNICATION OF NEEDS Skin   Supervision Verbally Normal                       Personal Care Assistance Level of Assistance  Bathing, Feeding, Dressing Bathing Assistance: Limited assistance Feeding assistance: Limited assistance Dressing Assistance: Limited assistance     Functional Limitations Info  Sight, Hearing, Speech Sight Info: Adequate Hearing Info: Adequate Speech Info: Adequate    SPECIAL CARE FACTORS FREQUENCY                       Contractures Contractures Info: Not present    Additional Factors Info  Code Status, Allergies, Psychotropic Code Status Info: Full Code  Allergies Info: Morphine and related  Psychotropic Info: See MAR / D/C summary          Current Medications (03/18/2016):  This is the current  hospital active medication list Current Facility-Administered Medications  Medication Dose Route Frequency Provider Last Rate Last Dose  . acetaminophen (TYLENOL) tablet 650 mg  650 mg Oral Q6H PRN Kerry HoughSpencer E Simon, PA-C      . alum & mag hydroxide-simeth (MAALOX/MYLANTA) 200-200-20 MG/5ML suspension 30 mL  30 mL Oral Q4H PRN Kerry HoughSpencer E Simon, PA-C      . benztropine (COGENTIN) tablet 0.5 mg  0.5 mg Oral QHS Jomarie LongsSaramma Eappen, MD   0.5 mg at 03/17/16 2054  . divalproex (DEPAKOTE) DR tablet 500 mg  500 mg Oral BH-[id number] Jomarie LongsSaramma Eappen, MD   500 mg at 03/18/16 0738  . eszopiclone (LUNESTA) tablet 2 mg  2 mg Oral QHS Jomarie LongsSaramma Eappen, MD   2 mg at 03/17/16 2053  . gabapentin (NEURONTIN) capsule 300 mg  300 mg Oral QHS Jomarie LongsSaramma Eappen, MD   300 mg at 03/17/16 2053  . insulin aspart (novoLOG) injection 0-15 Units  0-15 Units Subcutaneous TID WC Jomarie LongsSaramma Eappen, MD   2 Units at 03/15/16 1708  . insulin aspart (novoLOG) injection 0-5 Units  0-5 Units Subcutaneous QHS Saramma Eappen, MD      . LORazepam (ATIVAN) tablet 2 mg  2 mg Oral Q6H PRN Sanjuana KavaAgnes I Nwoko, NP   2 mg at 03/18/16 [id number]   Or  . LORazepam (ATIVAN) injection 2 mg  2 mg Intramuscular Q6H PRN Sanjuana KavaAgnes I Nwoko, NP  2 mg at 03/18/16 0911  . magnesium hydroxide (MILK OF MAGNESIA) suspension 30 mL  30 mL Oral Daily PRN Kerry Hough, PA-C      . OLANZapine zydis (ZYPREXA) disintegrating tablet 5 mg  5 mg Oral BID Jomarie Longs, MD   5 mg at 03/18/16 4098     Discharge Medications: Please see discharge summary for a list of discharge medications.  Relevant Imaging Results:  Relevant Lab Results:   Additional Information SSN: [id number]  Georgia Duff Work [phone]

## 2016-03-18 NOTE — Progress Notes (Signed)
Psychoeducational Group Note  Date:  03/18/2016 Time:  2102  Group Topic/Focus:  Wrap-Up Group:   The focus of this group is to help patients review their daily goal of treatment and discuss progress on daily workbooks.  Participation Level: Did Not Attend  Participation Quality:  Not Applicable  Affect:  Not Applicable  Cognitive:  Not Applicable  Insight:  Not Applicable  Engagement in Group: Not Applicable  Additional Comments:  The patient did not attend group since she was asleep in her room.   [name] S 03/18/2016, 9:02 PM

## 2016-03-19 LAB — GLUCOSE, CAPILLARY
Glucose-Capillary: 79 mg/dL (ref 65–99)
Glucose-Capillary: 91 mg/dL (ref 65–99)
Glucose-Capillary: 120 mg/dL — ABNORMAL HIGH (ref 65–99)

## 2016-03-19 MED ORDER — OLANZAPINE 10 MG PO TBDP
10.0000 mg | ORAL_TABLET | Freq: Every day | ORAL | Status: DC
  Administered 2016-03-19 – 2016-03-22 (×4): 10 mg via ORAL
  Filled 2016-03-19 (×6): qty 1

## 2016-03-19 MED ORDER — OLANZAPINE 5 MG PO TBDP
5.0000 mg | ORAL_TABLET | Freq: Every day | ORAL | Status: DC
  Administered 2016-03-20 – 2016-04-16 (×28): 5 mg via ORAL
  Filled 2016-03-19 (×12): qty 1
  Filled 2016-03-19: qty 7
  Filled 2016-03-19 (×20): qty 1

## 2016-03-19 MED ORDER — TUBERCULIN PPD 5 UNIT/0.1ML ID SOLN
5.0000 [IU] | Freq: Once | INTRADERMAL | Status: AC
  Administered 2016-03-19: 5 [IU] via INTRADERMAL

## 2016-03-19 MED ORDER — ESZOPICLONE 2 MG PO TABS
3.0000 mg | ORAL_TABLET | Freq: Every day | ORAL | Status: DC
  Administered 2016-03-19 – 2016-03-23 (×5): 3 mg via ORAL
  Filled 2016-03-19 (×5): qty 2

## 2016-03-19 MED ORDER — GABAPENTIN 400 MG PO CAPS
400.0000 mg | ORAL_CAPSULE | Freq: Every day | ORAL | Status: DC
  Administered 2016-03-19 – 2016-03-21 (×2): 400 mg via ORAL
  Filled 2016-03-19 (×4): qty 1

## 2016-03-19 NOTE — Progress Notes (Signed)
  D: Pt continues to be loud and in need of constant redirection while awake. Writer finds it difficult to follow the pt in conversation. Pt continues to focus on religion and her family. Pt has no questions or concerns.    A:  Support and encouragement was offered. 15 min checks continued for safety.  R: Pt remains safe.

## 2016-03-19 NOTE — Progress Notes (Signed)
PPD administered at 1431 hrs on left arm

## 2016-03-19 NOTE — Progress Notes (Signed)
Recreation Therapy Notes  Date: 03/19/16 Time: 0950 Location: [address] Dayroom  Group Topic: Coping Skills  Goal Area(s) Addresses:  Pt will be able to identify situations that require using coping skills. Pt will be able to identify the benefits of positive coping skills. Pt will be able to identify the benefits of using coping skills post d/c.  Behavioral Response: None  Intervention: OrthoptistWeb design, pencils  Activity: OrthoptistWeb Design.  Patients were given a worksheet with a blank spider web on it.  Patients were to imagine themselves being in the middle of the web.  Patients were to then write the things and situations within the web that have gotten them "stuck".  Once patients identified their struggles, they were to identify and write coping skills on the outside of the web the could use to deal with the situations they listed.   Education: PharmacologistCoping Skills, Building control surveyorDischarge Planning.   Education Outcome: Acknowledges understanding/In group clarification offered/Needs additional education.   Clinical Observations/Feedback:  Pt was unable to focus and stay on task.  Pt left and did not return.   Catherine RancherMarjette Rilea Bender, LRT/CTRS   Catherine AbedLindsay, Catherine Bender A 03/19/2016 11:55 AM

## 2016-03-19 NOTE — Progress Notes (Signed)
Rn 1:1 Note   D: Patient denies SI/HI and A/V hallucinations  A: Monitored q 15 minutes; patient encouraged to attend groups; patient educated about medications; patient given medications per physician orders; patient encouraged to express feelings and/or concerns  R: Patient is in her room organizing her belongings in her bags over and over; patient is not talking a lot but is really focused on organizing

## 2016-03-19 NOTE — Progress Notes (Signed)
Patient ID: Delorse LimberRenitha Rothbauer, female   DOB: 04/12/1970, 46 y.o.   MRN: [id number] D: client visible on the unit, labile, tangential, visits with daughter tonight. Client got agitated reporting she was suppose to get discharged today. Client has to be continually redirected. A: Writer provided emotional support, encouraged client to settle down and get some rest. Medications reviewed, administered as ordered. Staff will monitor q1:1 for safety. R: Client is safe on the unit.

## 2016-03-19 NOTE — Progress Notes (Addendum)
[facility] Progress [facility]  03/19/2016 2:17 PM Catherine LimberRenitha Bender  MRN:  [id number] Subjective:  Patient states "  I want to get my check in my name , I need to go and get it at the social security office. Catherine DistanceRodney is my doctor , his son Catherine Bender is my doctor ."       Objective:Patient seen and chart reviewed.Discussed patient with treatment team.  Pt seen as pressured , tangential often , she starts out talking about her several stressors , then goes off tangent and starts talking about her sitter who is with her being her doctor and his son being her doctor and so on. Pt continues to have mood lability and is often noted as tearful - however is making progress . She is more redirectable , and is able to have a normal conversation often with staff. Pt with qtc prolongation on admission - most recent EKG showed improvement , however offending psychotropic medications were discontinued , and her dose of zyprexa was reduced . Will get another EKG today if she agrees and will readjust dose as needed. CSW to continue to work on placement.   Principal Problem: Schizoaffective disorder, bipolar type (HCC)  Diagnosis:   Patient Active Problem List   Diagnosis Date Noted  . Prolonged Q-T interval on ECG [R94.31] 03/13/2016  . Schizoaffective disorder, bipolar type (HCC) [F25.0] 03/12/2016  . Diabetes mellitus (HCC) [E11.9] 03/12/2016   Total Time spent with patient: 25 minutes  Past Psychiatric History: Please see H&P.   Past Medical History:  Past Medical History:  Diagnosis Date  . Diabetes mellitus without complication [facility](HCC)     Past Surgical History:  Procedure Laterality Date  . umbical hernia repair     Family History: Please see H&P.  Family Psychiatric  History: Please see H&P.  Social History: Please see H&P.  History  Alcohol Use No     History  Drug Use No    Social History   Social History  . Marital status: Single    Spouse name: N/A  . Number of children: N/A  . Years  of education: N/A   Social History Main Topics  . Smoking status: Current Every Day Smoker    Packs/day: 0.25  . Smokeless tobacco: Never Used  . Alcohol use No  . Drug use: No  . Sexual activity: Not Asked   Other Topics Concern  . None   Social History Narrative  . None   Additional Social History:                         Sleep: improving  Appetite:  Fair  Current Medications: Current Facility-Administered Medications  Medication Dose Route Frequency Provider Last Rate Last Dose  . acetaminophen (TYLENOL) tablet 650 mg  650 mg Oral Q6H PRN Kerry HoughSpencer E Simon, PA-C      . alum & mag hydroxide-simeth (MAALOX/MYLANTA) 200-200-20 MG/5ML suspension 30 mL  30 mL Oral Q4H PRN Kerry HoughSpencer E Simon, PA-C   30 mL at 03/19/16 0021  . benztropine (COGENTIN) tablet 0.5 mg  0.5 mg Oral QHS Jomarie LongsSaramma Anner Baity, MD   0.5 mg at 03/18/16 2306  . divalproex (DEPAKOTE) DR tablet 500 mg  500 mg Oral BH-[id number] Vona Whiters, MD   500 mg at 03/19/16 1400  . eszopiclone (LUNESTA) tablet 3 mg  3 mg Oral QHS Daivion Pape, MD      . gabapentin (NEURONTIN) capsule 400 mg  400 mg Oral QHS Jelan Batterton  Solveig Fangman, MD      . insulin aspart (novoLOG) injection 0-15 Units  0-15 Units Subcutaneous TID WC Jomarie Longs, MD   2 Units at 03/15/16 1708  . insulin aspart (novoLOG) injection 0-5 Units  0-5 Units Subcutaneous QHS Lylia Karn, MD      . LORazepam (ATIVAN) tablet 2 mg  2 mg Oral Q6H PRN Sanjuana Kava, NP   2 mg at 03/19/16 1401   Or  . LORazepam (ATIVAN) injection 2 mg  2 mg Intramuscular Q6H PRN Sanjuana Kava, NP   2 mg at 03/18/16 0911  . magnesium hydroxide (MILK OF MAGNESIA) suspension 30 mL  30 mL Oral Daily PRN Kerry Hough, PA-C      . nicotine polacrilex (NICORETTE) gum 2 mg  2 mg Oral PRN Jomarie Longs, MD      . OLANZapine zydis (ZYPREXA) disintegrating tablet 5 mg  5 mg Oral BID Jomarie Longs, MD   5 mg at 03/19/16 0743  . tuberculin injection 5 Units  5 Units Intradermal Once Jomarie Longs, MD        Lab Results:  Results for orders placed or performed during the hospital encounter of 03/11/16 (from the past 48 hour(s))  Glucose, capillary     Status: None   Collection Time: 03/17/16  5:27 PM  Result Value Ref Range   Glucose-Capillary 95 65 - 99 mg/dL  Glucose, capillary     Status: None   Collection Time: 03/17/16  8:48 PM  Result Value Ref Range   Glucose-Capillary 98 65 - 99 mg/dL   Comment 1 Notify RN   Glucose, capillary     Status: None   Collection Time: 03/18/16  5:48 AM  Result Value Ref Range   Glucose-Capillary 88 65 - 99 mg/dL  Valproic acid level     Status: None   Collection Time: 03/18/16  6:25 AM  Result Value Ref Range   Valproic Acid Lvl 83 50.0 - 100.0 ug/mL    Comment: Performed at [facility], [address] [place], [place] [address]  Glucose, capillary     Status: None   Collection Time: 03/18/16 12:08 PM  Result Value Ref Range   Glucose-Capillary 76 65 - 99 mg/dL   Comment 1 Notify RN    Comment 2 Document in Chart   Glucose, capillary     Status: Abnormal   Collection Time: 03/18/16  5:10 PM  Result Value Ref Range   Glucose-Capillary 102 (H) 65 - 99 mg/dL  Glucose, capillary     Status: None   Collection Time: 03/18/16  9:57 PM  Result Value Ref Range   Glucose-Capillary 89 65 - 99 mg/dL  Glucose, capillary     Status: None   Collection Time: 03/19/16  6:03 AM  Result Value Ref Range   Glucose-Capillary 79 65 - 99 mg/dL  Glucose, capillary     Status: None   Collection Time: 03/19/16 12:09 PM  Result Value Ref Range   Glucose-Capillary 91 65 - 99 mg/dL    Blood Alcohol level:  Lab Results  Component Value Date   ETH <5 03/11/2016   ETH <5 01/05/2016    Metabolic Disorder Labs: Lab Results  Component Value Date   HGBA1C 5.2 03/13/2016   MPG 103 03/13/2016   Lab Results  Component Value Date   PROLACTIN 27.9 (H) 03/13/2016   Lab Results  Component Value Date   CHOL 150 03/13/2016    TRIG 69 03/13/2016  HDL 43 03/13/2016   CHOLHDL 3.5 03/13/2016   VLDL 14 03/13/2016   LDLCALC 93 03/13/2016    Physical Findings: AIMS: Facial and Oral Movements Muscles of Facial Expression: None, normal Lips and Perioral Area: None, normal Jaw: None, normal Tongue: None, normal,Extremity Movements Upper (arms, wrists, hands, fingers): None, normal Lower (legs, knees, ankles, toes): None, normal, Trunk Movements Neck, shoulders, hips: None, normal, Overall Severity Severity of abnormal movements (highest score from questions above): None, normal Incapacitation due to abnormal movements: None, normal Patient's awareness of abnormal movements (rate only patient's report): No Awareness, Dental Status Current problems with teeth and/or dentures?: No Does patient usually wear dentures?: No  CIWA:    COWS:     Musculoskeletal: Strength & Muscle Tone: within normal limits Gait & Station: unsteady Patient leans: N/A  Psychiatric Specialty Exam: Physical Exam  Nursing note and vitals reviewed.   Review of Systems  Psychiatric/Behavioral: Positive for depression and substance abuse. The patient is nervous/anxious and has insomnia.   All other systems reviewed and are negative.   Blood pressure (!) 144/96, pulse 74, temperature 98.6 F (37 C), temperature source Oral, resp. rate 18, height 5\' 5"  (1.651 m), weight 92.5 kg (204 lb), SpO2 97 %.Body mass index is 33.95 kg/m.  General Appearance: Guarded  Eye Contact:  Fair  Speech:  Pressured  Volume:  Increased on and off , able to stay calm more than on admission  Mood:  Anxious, Dysphoric and Irritable  Affect:  Labile and Tearful at times , making some progress  Thought Process:  Irrelevant and Descriptions of Associations: Tangential  Orientation:  Other:  to place, situation, self  Thought Content:  Delusions, Paranoid Ideation, Rumination and Tangential on and off , with some improvement   Suicidal Thoughts:  No  Homicidal  Thoughts:  No  Memory:  Immediate;   Fair Recent;   Poor Remote;   Poor  Judgement:  Impaired  Insight:  Shallow  Psychomotor Activity:  Restlessness  Concentration:  Concentration: Poor and Attention Span: Poor  Recall:  Poor  Fund of Knowledge:  Poor  Language:  Fair  Akathisia:  No    AIMS (if indicated):     Assets:  Others:  access to healthcare  ADL's:  Impaired  Cognition:  WNL  Sleep:  Number of Hours: 2.5     Treatment Plan Summary:Patient today seen as labile, pressured , and making irrelevant statements on and off. Pt continues to need 1:1 precaution for safety reasons.  Schizoaffective disorder, bipolar type (HCC) unstable- Will continue today 03/19/16 plan as below except where it is noted.   Daily contact with patient to assess and evaluate symptoms and progress in treatment and Medication management Reviewed past medical records,treatment plan.   For schizoaffective do: Reduced  Zyprexa to 5 mg po bid . Dose reduced due to her persistent QTC prolongation as well as her inability to walk , drowsiness. ( per daughter - at baseline she takes care of self and is better functioning - is not wheelchair bound) . Repeat EKG ( 03/17/16)- shows qtc- improving - however likely due to dose reduction of offending agent.Will get another EKG today ( 03/19/16) .  Cogentin 0.5 mg po qhs for EPS. Will continue Depakote DR 500 mg po tid for mood sx. Depakote level on 03/18/16- 83 ( therapeutic)  Will increase Neurontin to 400 mg po qhs for anxiety/restlessness.  For insomnia: Discontinue Ambien due to lack of efficacy . Will increase Lunesta to 3  mg po qhs for sleep.  For hx of prolonged QTC: Repeat EKG showed qtc improving - will continue to monitor.Repeat EKG today- 03/19/16.  Will offer PPD test today - 03/19/16 - for ALF placement.   Will continue 1:1 precaution for safety reasons.  Will continue to monitor vitals ,medication compliance and treatment side effects while  patient is here.   Will monitor for medical issues as well as call consult as needed.   Reviewed labs ,will order as needed.   CSW will continue working on disposition. Patient to be referred to ALF /day program.  Patient to participate in therapeutic milieu .        Berk Pilot, MD 03/19/2016, 2:17 PM   Addendum 03/19/16 3:22 pm EKG reviewed qtc - wnl. Will increase bedtime dose of zyprexa by 5 mg po. Will continue to monitor EKG for qtc prolongation.  Jomarie Longs ,MD Attending Psychiatrist  [facility]

## 2016-03-19 NOTE — [facility] Group Notes (Signed)
Type of Therapy: Process Group Therapy  Participation Level:  Active  Participation Quality:  Appropriate  Affect:  Flat  Cognitive:  Oriented  Insight:  Improving  Engagement in Group:  Limited  Engagement in Therapy:  Limited  Modes of Intervention:  Activity, Clarification, Education, Problem-solving and Support  Summary of Progress/Problems: Invited, chose not to attend.   Today's group addressed balance in life. Patients participated in the discussion about when their life was in balance and out of balance and how this feels. Patients discussed ways to get back in balance and short term goals they can work on to get where they want to be.   

## 2016-03-19 NOTE — Progress Notes (Signed)
Patient has been pacing the hall with 1:1 in close proximity.  Patient remains loud with pressured and intrusive.  Patient continues needing redirection and monitoring for fall risk.  Continue to monitor as planned 

## 2016-03-19 NOTE — Progress Notes (Signed)
  D: Pt in the dayroom talking to herself as well as her peers. Pt continues to have constant redirection, and is slightly agitated this morning.  Pt has no questions or concerns.    A:  Support and encouragement was offered. 1:1 continued for safety.  R: Pt remains safe.

## 2016-03-19 NOTE — Progress Notes (Signed)
   D:Pt laying in bed resting with eyes closed. Respirations even and unlabored. No distress noted. A: Monitor  For 1:1 for safety. R: Pt remains safe.

## 2016-03-19 NOTE — Progress Notes (Signed)
RN 1:1   D: Patient is labile, patient is rapping one moment , and then upset and crying the next; patient states " I want to go home and they will not let me out", patient is saying this while she is crying  A: Continue 1:1 observation  R: Patient needs redirection constantly; patient does not sit still and is crying one moment and laughing the next; patient can get agitated at times when talking about leaving but can be redirected

## 2016-03-20 LAB — GLUCOSE, CAPILLARY
GLUCOSE-CAPILLARY: 79 mg/dL (ref 65–99)
Glucose-Capillary: 70 mg/dL (ref 65–99)
Glucose-Capillary: 90 mg/dL (ref 65–99)
Glucose-Capillary: 140 mg/dL — ABNORMAL HIGH (ref 65–99)

## 2016-03-20 NOTE — Progress Notes (Signed)
Patient has been pacing the hall with 1:1 in close proximity.  Patient remains loud with pressured and intrusive.  Patient continues needing redirection and monitoring for fall risk.  Continue to monitor as planned

## 2016-03-20 NOTE — Progress Notes (Signed)
Adult Psychoeducational Group Note  Date:  03/20/2016 Time:  9:13 PM  Group Topic/Focus:  Wrap-Up Group:   The focus of this group is to help patients review their daily goal of treatment and discuss progress on daily workbooks.  Participation Level:  Active  Participation Quality:  Appropriate  Affect:  Appropriate  Cognitive:  Appropriate  Insight: Appropriate  Engagement in Group:  Engaged  Modes of Intervention:  Discussion  Additional Comments: The patient expressed that she had a good day    Octavio Mannshigpen, Sierria Bruney Lee 03/20/2016, 9:13 PM

## 2016-03-20 NOTE — Progress Notes (Signed)
Patient has been pacing the hall with 1:1 in close proximity.  Patient remains loud with pressured and intrusive.  Patient continues needing redirection and monitoring for fall risk.  Continue to monitor as planned 

## 2016-03-20 NOTE — Plan of Care (Signed)
Problem: Safety: Goal: Ability to remain free from injury will improve Outcome: Progressing Client's ability to remain free from injury will improve with 1:1 supervision, safety belt and room close to nurses station. Client remains free from injury.
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
## 2016-03-20 NOTE — Progress Notes (Signed)
Patient ID: Catherine LimberRenitha Bender, female   DOB: 06/17/1970, 46 y.o.   MRN: [id number] D: Client visible on the unit, has visit from BF and daughter. Client become increasingly agitated, "they come to take me home and I want to go home now, I'm tired of being here and y"all can't stop me" Client stands at the door and demands to go home. A: Writer and staff provide emotional support client encouraged back in the room. Medications reviewed, administered as ordered. Staff will monitor 1:1 for safety. R: client is safe on the unit.

## 2016-03-20 NOTE — Progress Notes (Signed)
Recreation Therapy Notes  Date: 03/20/16 Time: 1000 Location: 500 Hall Dayroom  Group Topic: Communication, Team Building, Problem Solving  Goal Area(s) Addresses:  Patient will effectively work with peer towards shared goal.  Patient will identify skill used to make activity successful.  Patient will identify how skills used during activity can be used to reach post d/c goals.   Intervention: STEM Activity   Activity: Pipe Cleaner Tower. In teams, patients were asked to build the tallest freestanding tower possible out of 15 pipe cleaners. Systematically resources were removed, for example patient ability to use both hands and patient ability to verbally communicate.    Education:Social Skills, Discharge Planning.   Education Outcome: Acknowledges education/In group clarification offered/Needs additional education.   Clinical Observations/Feedback: Pt did not attend group.   Jerrion Tabbert, LRT/CTRS         Ketzia Guzek A 03/20/2016 12:59 PM 

## 2016-03-21 DIAGNOSIS — R9431 Abnormal electrocardiogram [ECG] [EKG]: Secondary | ICD-10-CM

## 2016-03-21 DIAGNOSIS — F17210 Nicotine dependence, cigarettes, uncomplicated: Secondary | ICD-10-CM

## 2016-03-21 LAB — GLUCOSE, CAPILLARY
Glucose-Capillary: 80 mg/dL (ref 65–99)
Glucose-Capillary: 87 mg/dL (ref 65–99)
Glucose-Capillary: 113 mg/dL — ABNORMAL HIGH (ref 65–99)
Glucose-Capillary: 102 mg/dL — ABNORMAL HIGH (ref 65–99)

## 2016-03-21 MED ORDER — GABAPENTIN 300 MG PO CAPS
300.0000 mg | ORAL_CAPSULE | Freq: Two times a day (BID) | ORAL | Status: DC
  Administered 2016-03-21 – 2016-03-22 (×2): 300 mg via ORAL
  Filled 2016-03-21 (×7): qty 1

## 2016-03-21 NOTE — [facility] Group Notes (Signed)
  Adult Psychoeducational Group Note  Date:  03/21/2016 Time:  9:00 PM  Group Topic/Focus:  Wrap-Up Group:   The focus of this group is to help patients review their daily goal of treatment and discuss progress on daily workbooks.  Participation Level:  Did Not Attend  Participation Quality:  Did Not Attend.  Affect:  Did Not Attend.  Cognitive:  Did Not Attend.  Insight: None  Engagement in Group:  Did Not Attend.  Modes of Intervention:  Did Not Attend.  Additional Comments:  Did Not Attend.  Elmore GuiseSLOAN, Sajjad Honea N 03/21/2016, 9:00 PM

## 2016-03-21 NOTE — Progress Notes (Signed)
Patient ID: Catherine LimberRenitha Bender, female   DOB: 05/25/1970, 46 y.o.   MRN: [id number]   Nursing 1:1 Note  D: Pt continues to be very loud and belligerent on the unit. Pt continues to require frequent redirection regarding her behavior and high fall risk. Pt continues to be very disorganized and continues to talk to people that are not there. Pt has no insight when it comes to her behavior or her actions. Pt was given Ativan 2mg  IM, no change in behaviors. She continued to request discharge, standing at door knocking yelling to leave. A: 1:1 continued for patient safety. R: Pts safety maintained.

## 2016-03-21 NOTE — Progress Notes (Signed)
[facility]  03/21/2016 3:16 PM Catherine LimberRenitha Bender  MRN:  [id number] Subjective:  I want to go school.  No one listens to me.  Objective; Patient seen chart reviewed.  Patient remains very labile, tangential and easily irritable.  She continues to need redirection in the unit.  Her behavior remains disorganized.  Her Zyprexa was adjusted yesterday.  Patient denies any side effects however she remains unsteady when she walks and often requires assistant.  She is on wheelchair.  Patient is on one-to-one precaution for safety.  She continued to exhibit delusions, irritability, paranoia and labile mood.  She sleeping off and on.  She is easily disruptive.  Principal Problem: Schizoaffective disorder, bipolar type (HCC) Diagnosis:   Patient Active Problem List   Diagnosis Date Noted  . Prolonged Q-T interval on ECG [R94.31] 03/13/2016  . Schizoaffective disorder, bipolar type (HCC) [F25.0] 03/12/2016  . Diabetes mellitus (HCC) [E11.9] 03/12/2016   Total Time spent with patient: 20 minutes  Past Psychiatric History: Reviewed.  Past Medical History:  Past Medical History:  Diagnosis Date  . Diabetes mellitus without complication [facility](HCC)     Past Surgical History:  Procedure Laterality Date  . umbical hernia repair     Family History: History reviewed. No pertinent family history. Family Psychiatric  History: reviewed. Social History:  History  Alcohol Use No     History  Drug Use No    Social History   Social History  . Marital status: Single    Spouse name: N/A  . Number of children: N/A  . Years of education: N/A   Social History Main Topics  . Smoking status: Current Every Day Smoker    Packs/day: 0.25  . Smokeless tobacco: Never Used  . Alcohol use No  . Drug use: No  . Sexual activity: Not Asked   Other Topics Concern  . None   Social History Narrative  . None   Additional Social History:                         Sleep: Poor  Appetite:   Fair  Current Medications: Current Facility-Administered Medications  Medication Dose Route Frequency Provider Last Rate Last Dose  . acetaminophen (TYLENOL) tablet 650 mg  650 mg Oral Q6H PRN Kerry HoughSpencer E Simon, PA-C   650 mg at 03/21/16 0650  . alum & mag hydroxide-simeth (MAALOX/MYLANTA) 200-200-20 MG/5ML suspension 30 mL  30 mL Oral Q4H PRN Kerry HoughSpencer E Simon, PA-C   30 mL at 03/19/16 0021  . benztropine (COGENTIN) tablet 0.5 mg  0.5 mg Oral QHS Saramma Eappen, MD   0.5 mg at 03/21/16 0032  . divalproex (DEPAKOTE) DR tablet 500 mg  500 mg Oral [id number] Jomarie LongsSaramma Eappen, MD   500 mg at 03/21/16 1451  . eszopiclone (LUNESTA) tablet 3 mg  3 mg Oral QHS Jomarie LongsSaramma Eappen, MD   3 mg at 03/21/16 0031  . gabapentin (NEURONTIN) capsule 300 mg  300 mg Oral BID Cleotis NipperSyed T Orenthal Debski, MD      . insulin aspart (novoLOG) injection 0-15 Units  0-15 Units Subcutaneous TID WC Jomarie LongsSaramma Eappen, MD   2 Units at 03/15/16 1708  . insulin aspart (novoLOG) injection 0-5 Units  0-5 Units Subcutaneous QHS Saramma Eappen, MD      . LORazepam (ATIVAN) tablet 2 mg  2 mg Oral Q6H PRN Sanjuana KavaAgnes I Nwoko, NP   2 mg at 03/21/16 1000   Or  . LORazepam (ATIVAN) injection 2 mg  2 mg Intramuscular Q6H PRN Sanjuana Kava, NP   2 mg at 03/18/16 0911  . magnesium hydroxide (MILK OF MAGNESIA) suspension 30 mL  30 mL Oral Daily PRN Kerry Hough, PA-C      . nicotine polacrilex (NICORETTE) gum 2 mg  2 mg Oral PRN Jomarie Longs, MD      . OLANZapine zydis (ZYPREXA) disintegrating tablet 10 mg  10 mg Oral QHS Jomarie Longs, MD   10 mg at 03/21/16 0030  . OLANZapine zydis (ZYPREXA) disintegrating tablet 5 mg  5 mg Oral Daily Jomarie Longs, MD   5 mg at 03/21/16 0825    Lab Results:  Results for orders placed or performed during the hospital encounter of 03/11/16 (from the past 48 hour(s))  Glucose, capillary     Status: Abnormal   Collection Time: 03/19/16  8:33 PM  Result Value Ref Range   Glucose-Capillary 120 (H) 65 - 99 mg/dL  Glucose,  capillary     Status: None   Collection Time: 03/20/16  6:03 AM  Result Value Ref Range   Glucose-Capillary 70 65 - 99 mg/dL  Glucose, capillary     Status: None   Collection Time: 03/20/16 12:06 PM  Result Value Ref Range   Glucose-Capillary 79 65 - 99 mg/dL   Comment 1 Notify RN    Comment 2 Document in Chart   Glucose, capillary     Status: None   Collection Time: 03/20/16  4:55 PM  Result Value Ref Range   Glucose-Capillary 90 65 - 99 mg/dL   Comment 1 Notify RN    Comment 2 Document in Chart   Glucose, capillary     Status: Abnormal   Collection Time: 03/20/16  8:35 PM  Result Value Ref Range   Glucose-Capillary 140 (H) 65 - 99 mg/dL  Glucose, capillary     Status: None   Collection Time: 03/21/16  5:39 AM  Result Value Ref Range   Glucose-Capillary 80 65 - 99 mg/dL  Glucose, capillary     Status: None   Collection Time: 03/21/16 11:47 AM  Result Value Ref Range   Glucose-Capillary 87 65 - 99 mg/dL   Comment 1 Notify RN    Comment 2 Document in Chart     Blood Alcohol level:  Lab Results  Component Value Date   ETH <5 03/11/2016   ETH <5 01/05/2016    Metabolic Disorder Labs: Lab Results  Component Value Date   HGBA1C 5.2 03/13/2016   MPG 103 03/13/2016   Lab Results  Component Value Date   PROLACTIN 27.9 (H) 03/13/2016   Lab Results  Component Value Date   CHOL 150 03/13/2016   TRIG 69 03/13/2016   HDL 43 03/13/2016   CHOLHDL 3.5 03/13/2016   VLDL 14 03/13/2016   LDLCALC 93 03/13/2016    Physical Findings: AIMS: Facial and Oral Movements Muscles of Facial Expression: None, normal Lips and Perioral Area: None, normal Jaw: None, normal Tongue: None, normal,Extremity Movements Upper (arms, wrists, hands, fingers): None, normal Lower (legs, knees, ankles, toes): None, normal, Trunk Movements Neck, shoulders, hips: None, normal, Overall Severity Severity of abnormal movements (highest score from questions above): None, normal Incapacitation due to  abnormal movements: None, normal Patient's awareness of abnormal movements (rate only patient's report): No Awareness, Dental Status Current problems with teeth and/or dentures?: No Does patient usually wear dentures?: No  CIWA:    COWS:     Musculoskeletal: Strength & Muscle Tone: decreased Gait & Station:  unsteady Patient leans: N/A  Psychiatric Specialty Exam: Physical Exam  Review of Systems  Constitutional: Negative.   Skin: Negative for itching and rash.  Psychiatric/Behavioral: The patient is nervous/anxious and has insomnia.     Blood pressure 139/83, pulse 66, temperature 98.3 F (36.8 C), resp. rate 16, height 5\' 5"  (1.651 m), weight 92.5 kg (204 lb), SpO2 97 %.Body mass index is 33.95 kg/m.  General Appearance: Disheveled and Guarded  Eye Contact:  Poor  Speech:  Pressured and rambling  Volume:  Normal  Mood:  Irritable  Affect:  Inappropriate and Labile  Thought Process:  Disorganized  Orientation:  Other:  oriented to place only  Thought Content:  Illogical, Delusions and Paranoid Ideation  Suicidal Thoughts:  No  Homicidal Thoughts:  No  Memory:  Immediate;   Poor Recent;   Poor Remote;   Poor  Judgement:  Impaired  Insight:  Lacking  Psychomotor Activity:  Increased and Restlessness  Concentration:  Concentration: Poor and Attention Span: Poor  Recall:  Poor  Fund of Knowledge:  Fair  Language:  Fair  Akathisia:  No  Handed:  Right  AIMS (if indicated):     Assets:  Desire for Improvement  ADL's:  Impaired  Cognition:  WNL  Sleep:  Number of Hours: 1.25     Treatment Plan Summary: Medication management  Patient continued to exhibit symptoms of psychosis and labile mood.  Continue Zyprexa 15 mg in divided dose. Increase Neurontin 300 mg twice a day to help anxiety and restlessness. Continue Lunesta 3 mg at bedtime. Continue Depakote 500 mg 3 times a day.  Last level on February seventh was therapeutic. Patient is on ALF placement.  We will  repeat PPD tomorrow. Encourage to participate in group milieu therapy if clinically stable. EKG pending.  Wallace Cogliano T., MD 03/21/2016, 3:16 PM

## 2016-03-21 NOTE — Progress Notes (Signed)
Patient ID: Catherine LimberRenitha Bender, female   DOB: 04/12/1970, 46 y.o.   MRN: [id number]   EKG completed and placed on the chart. Dr Lolly MustacheArfeen made aware. Catherine BalintShalita Karmina Zufall RN

## 2016-03-21 NOTE — Progress Notes (Signed)
Patient ID: Catherine LimberRenitha Bender, female   DOB: 12/09/1970, 46 y.o.   MRN: [id number]   Nursing 1:1 Note  D: Pt has been very restless on the unit, she is up down continuously. Pt continues to require frequent redirection and support when transferring from chair to bed. Pt does not accept feedback well and when you try to assist her she would say that someone was hurting her. Pt remains very disorganized, continues to report that demons are around her. Pt has no insight and has not responded to the medication. A: 1:1 continued for patient safety. R: Pts safety maintained.

## 2016-03-21 NOTE — [facility] Group Notes (Signed)
Adult Therapy Group Note (Clinical Social Work)  Date:  03/21/2016 Time: 11:15AM-12:00PM  Group Topic/Focus: Healthy Coping Techniques  The focus of this group was to help patients identify healthy coping techniques they use in their lives at home, as well as healthy coping skills they can use during this admission.  Each person explained their techniques to the rest of the group and told how it helps them.  At the end of the group, a 5-minute guided meditation was done, and patient reactions were overwhelming positive, as they stated they felt "relaxed," "like a natural high," "wonderful."  They expressed an interest in using meditation at discharge.  Participation Level:  Active  Participation Quality:  Intrusive and Distracting  Affect:  Flat and Irritable  Cognitive:  Disorganized, Confused and Delusional  Insight: None  Engagement in Group:  Limited  Modes of Intervention:  Activity, Discussion and Exploration  Additional Comments:  Pt stated she uses walking as a coping technique, but that was all she could stay on topic to discuss.  For the remainder of her 25 minutes in group, she had to be continually redirected to the topic, and eventually did leave the room, was told not to return because of how disruptive that process was to the group.  Carloyn JaegerMareida J Grossman-Orr 03/21/2016, 12:24 PM

## 2016-03-21 NOTE — Progress Notes (Signed)
Patient ID: Catherine LimberRenitha Bender, female   DOB: 05/04/1970, 46 y.o.   MRN: [id number]    PPD to left forearm read on 03/21/16 @ 1444, results were negative 0mm. Catherine BalintShalita Kadon Andrus RN

## 2016-03-21 NOTE — Progress Notes (Signed)
Nursing 1:1 note D:Pt observed on unit restless, and agitated, at beginning of shift. Patient redirected and she became calm and later asked for medication. Patient informed too early for medication and she then asked for bath. Assisted to room and with hygiene needs. Patient then administered night medication and is currently laying on bed and appears to be sleeping  with eyes closed. RR even and unlabored. No distress noted.  A: 1:1 observation continues for safety  R: pt remains safe

## 2016-03-21 NOTE — Progress Notes (Signed)
Patient ID: Catherine Bender, female   DOB: 12/28/1970, 46 y.o.   MRN: [id number]   Nursing 1:1 Note  D: Pt continues to be very loud and belligerent on the unit. Pt continues to require frequent redirection regarding her behavior and high fall risk. Pt continues to be very disorganized and continues to talk to people that are not there. Pt has no insight when it comes to her behavior or her actions. A: 1:1 continued for patient safety. R: Pts safety maintained.

## 2016-03-21 NOTE — [facility] Group Notes (Signed)
[facility] Group Notes:  (Nursing/MHT/Case Management/Adjunct)  Date:  03/21/2016  Time:  11:59 AM  Type of Therapy:  Psychoeducational Skills  Participation Level:  Did Not Attend  Participation Quality:  Did Not Attend  Affect:  Did Not Attend  Cognitive:  Did Not Attend  Insight:  None  Engagement in Group:  Did Not Attend  Modes of Intervention:  Did Not Attend  Summary of Progress/Problems: Pt did not attend patient self inventory group.   Jacquelyne BalintForrest, Rosmery Duggin Shanta 03/21/2016, 11:59 AM

## 2016-03-22 LAB — GLUCOSE, CAPILLARY
GLUCOSE-CAPILLARY: 83 mg/dL (ref 65–99)
GLUCOSE-CAPILLARY: 66 mg/dL (ref 65–99)
GLUCOSE-CAPILLARY: 134 mg/dL — AB (ref 65–99)
Glucose-Capillary: 82 mg/dL (ref 65–99)
Glucose-Capillary: 99 mg/dL (ref 65–99)

## 2016-03-22 MED ORDER — HALOPERIDOL 5 MG PO TABS
5.0000 mg | ORAL_TABLET | Freq: Once | ORAL | Status: AC
  Administered 2016-03-22: 5 mg via ORAL
  Filled 2016-03-22: qty 1

## 2016-03-22 MED ORDER — HALOPERIDOL 5 MG PO TABS
ORAL_TABLET | ORAL | Status: AC
  Administered 2016-03-22: 5 mg via ORAL
  Filled 2016-03-22: qty 1

## 2016-03-22 MED ORDER — GABAPENTIN 300 MG PO CAPS
300.0000 mg | ORAL_CAPSULE | Freq: Three times a day (TID) | ORAL | Status: DC
  Administered 2016-03-22 – 2016-03-24 (×6): 300 mg via ORAL
  Filled 2016-03-22 (×8): qty 1

## 2016-03-22 MED ORDER — LORAZEPAM 2 MG/ML IJ SOLN
2.0000 mg | Freq: Once | INTRAMUSCULAR | Status: AC
  Administered 2016-03-22: 2 mg via INTRAVENOUS

## 2016-03-22 NOTE — Progress Notes (Signed)
[facility] MD Progress Note  03/22/2016 1:00 PM Catherine Bender  MRN:  [id number]   Subjective:  I want to go home.  I want money any you not letting me have it.    Objective; Patient seen chart reviewed.  Patient remains very labile, tangential and easily irritable.  She is disruptive and requires excessive redirection.  Patient on one-to-one watch.  She was given multiple when necessary medication to calm her down.  In the morning she was given Ativan and then Zyprexa with limited response.  She was given Haldol 5 mg one dose than Ativan 2 mg IM.  She could use to have delusion, confusion, irritability, disorganized behavior.  She is walking with the help of walker on the unit but also easily angry irritable and like to be discharge.  She continued to obsess that she is misplaced in this unit.  Her repeat EKG shows QTC prolongation better than past.  As per staff she is not sleeping as good and getting easily distracted and intrusive.    Principal Problem: Schizoaffective disorder, bipolar type (HCC) Diagnosis:   Patient Active Problem List   Diagnosis Date Noted  . Prolonged Q-T interval on ECG [R94.31] 03/13/2016  . Schizoaffective disorder, bipolar type (HCC) [F25.0] 03/12/2016  . Diabetes mellitus (HCC) [E11.9] 03/12/2016   Total Time spent with patient: 20 minutes  Past Psychiatric History: Reviewed.  Past Medical History:  Past Medical History:  Diagnosis Date  . Diabetes mellitus without complication [facility])     Past Surgical History:  Procedure Laterality Date  . umbical hernia repair     Family History: History reviewed. No pertinent family history. Family Psychiatric  History: Reviewed. Social History:  History  Alcohol Use No     History  Drug Use No    Social History   Social History  . Marital status: Single    Spouse name: N/A  . Number of children: N/A  . Years of education: N/A   Social History Main Topics  . Smoking status: Current Every Day Smoker     Packs/day: 0.25  . Smokeless tobacco: Never Used  . Alcohol use No  . Drug use: No  . Sexual activity: Not Asked   Other Topics Concern  . None   Social History Narrative  . None   Additional Social History:                         Sleep: Poor  Appetite:  Fair  Current Medications: Current Facility-Administered Medications  Medication Dose Route Frequency Provider Last Rate Last Dose  . acetaminophen (TYLENOL) tablet 650 mg  650 mg Oral Q6H PRN Kerry Hough, PA-C   650 mg at 03/21/16 0650  . alum & mag hydroxide-simeth (MAALOX/MYLANTA) 200-200-20 MG/5ML suspension 30 mL  30 mL Oral Q4H PRN Kerry Hough, PA-C   30 mL at 03/19/16 0021  . benztropine (COGENTIN) tablet 0.5 mg  0.5 mg Oral QHS Jomarie Longs, MD   0.5 mg at 03/21/16 2058  . divalproex (DEPAKOTE) DR tablet 500 mg  500 mg Oral [id number] Jomarie Longs, MD   500 mg at 03/22/16 0832  . eszopiclone (LUNESTA) tablet 3 mg  3 mg Oral QHS Jomarie Longs, MD   3 mg at 03/21/16 2058  . gabapentin (NEURONTIN) capsule 300 mg  300 mg Oral BID Cleotis Nipper, MD   300 mg at 03/22/16 0846  . insulin aspart (novoLOG) injection 0-15 Units  0-15 Units Subcutaneous  TID WC Jomarie Longs, MD   2 Units at 03/15/16 1708  . insulin aspart (novoLOG) injection 0-5 Units  0-5 Units Subcutaneous QHS Saramma Eappen, MD      . LORazepam (ATIVAN) tablet 2 mg  2 mg Oral Q6H PRN Sanjuana Kava, NP   2 mg at 03/22/16 0600   Or  . LORazepam (ATIVAN) injection 2 mg  2 mg Intramuscular Q6H PRN Sanjuana Kava, NP   2 mg at 03/21/16 1542  . magnesium hydroxide (MILK OF MAGNESIA) suspension 30 mL  30 mL Oral Daily PRN Kerry Hough, PA-C      . nicotine polacrilex (NICORETTE) gum 2 mg  2 mg Oral PRN Jomarie Longs, MD      . OLANZapine zydis (ZYPREXA) disintegrating tablet 10 mg  10 mg Oral QHS Jomarie Longs, MD   10 mg at 03/21/16 2059  . OLANZapine zydis (ZYPREXA) disintegrating tablet 5 mg  5 mg Oral Daily Jomarie Longs, MD   5 mg at  03/22/16 0847    Lab Results:  Results for orders placed or performed during the hospital encounter of 03/11/16 (from the past 48 hour(s))  Glucose, capillary     Status: None   Collection Time: 03/20/16  4:55 PM  Result Value Ref Range   Glucose-Capillary 90 65 - 99 mg/dL   Comment 1 Notify RN    Comment 2 Document in Chart   Glucose, capillary     Status: Abnormal   Collection Time: 03/20/16  8:35 PM  Result Value Ref Range   Glucose-Capillary 140 (H) 65 - 99 mg/dL  Glucose, capillary     Status: None   Collection Time: 03/21/16  5:39 AM  Result Value Ref Range   Glucose-Capillary 80 65 - 99 mg/dL  Glucose, capillary     Status: None   Collection Time: 03/21/16 11:47 AM  Result Value Ref Range   Glucose-Capillary 87 65 - 99 mg/dL   Comment 1 Notify RN    Comment 2 Document in Chart   Glucose, capillary     Status: Abnormal   Collection Time: 03/21/16  5:01 PM  Result Value Ref Range   Glucose-Capillary 113 (H) 65 - 99 mg/dL  Glucose, capillary     Status: Abnormal   Collection Time: 03/21/16  8:38 PM  Result Value Ref Range   Glucose-Capillary 102 (H) 65 - 99 mg/dL  Glucose, capillary     Status: None   Collection Time: 03/22/16  6:13 AM  Result Value Ref Range   Glucose-Capillary 83 65 - 99 mg/dL  Glucose, capillary     Status: None   Collection Time: 03/22/16 11:31 AM  Result Value Ref Range   Glucose-Capillary 66 65 - 99 mg/dL  Glucose, capillary     Status: None   Collection Time: 03/22/16 12:00 PM  Result Value Ref Range   Glucose-Capillary 82 65 - 99 mg/dL    Blood Alcohol level:  Lab Results  Component Value Date   ETH <5 03/11/2016   ETH <5 01/05/2016    Metabolic Disorder Labs: Lab Results  Component Value Date   HGBA1C 5.2 03/13/2016   MPG 103 03/13/2016   Lab Results  Component Value Date   PROLACTIN 27.9 (H) 03/13/2016   Lab Results  Component Value Date   CHOL 150 03/13/2016   TRIG 69 03/13/2016   HDL 43 03/13/2016   CHOLHDL 3.5  03/13/2016   VLDL 14 03/13/2016   LDLCALC 93 03/13/2016  Physical Findings: AIMS: Facial and Oral Movements Muscles of Facial Expression: None, normal Lips and Perioral Area: None, normal Jaw: None, normal Tongue: None, normal,Extremity Movements Upper (arms, wrists, hands, fingers): None, normal Lower (legs, knees, ankles, toes): None, normal, Trunk Movements Neck, shoulders, hips: None, normal, Overall Severity Severity of abnormal movements (highest score from questions above): None, normal Incapacitation due to abnormal movements: None, normal Patient's awareness of abnormal movements (rate only patient's report): No Awareness, Dental Status Current problems with teeth and/or dentures?: No Does patient usually wear dentures?: No  CIWA:    COWS:     Musculoskeletal: Strength & Muscle Tone: decreased Gait & Station: unsteady Patient leans: Front and Backward  Psychiatric Specialty Exam: Physical Exam  Review of Systems  Psychiatric/Behavioral: The patient is nervous/anxious and has insomnia.        Delusional and labile    Blood pressure (!) 136/98, pulse 75, temperature 98.3 F (36.8 C), resp. rate 20, height 5\' 5"  (1.651 m), weight 92.5 kg (204 lb), SpO2 97 %.Body mass index is 33.95 kg/m.  General Appearance: Guarded  Eye Contact:  Minimal  Speech:  Rambling and pressured  Volume:  Increased  Mood:  Irritable  Affect:  Labile  Thought Process:  Disorganized  Orientation:  Other:  Oriented to place only  Thought Content:  Illogical, Delusions, Paranoid Ideation and Tangential  Suicidal Thoughts:  No  Homicidal Thoughts:  No  Memory:  Immediate;   Poor Recent;   Poor Remote;   Poor  Judgement:  Impaired  Insight:  Lacking  Psychomotor Activity:  Increased and Restlessness  Concentration:  Concentration: Poor and Attention Span: Poor  Recall:  Poor  Fund of Knowledge:  Poor  Language:  Fair  Akathisia:  No  Handed:  Right  AIMS (if indicated):      Assets:  Desire for Improvement  ADL's:  Impaired  Cognition:  Impaired,  Mild  Sleep:  Number of Hours: 5     Treatment Plan Summary: Daily contact with patient to assess and evaluate symptoms and progress in treatment and Patient continued to exhibit symptoms of psychosis, labile mood,.  She was given multiple medication for agitation this morning.  She remains very labile.  Continue Lunesta 3 mg at bedtime, continue Depakote 500 mg 3 times a day.  We have to monitor closely given more antipsychotic medication due to every 2 C prolongation.  Continue Zyprexa 10 mg at bedtime and use 5 mg as needed for severe agitation. Increase gabapentin 300 mg 3 times a day. Labs reviewed.  Last Depakote level 83 which was done on February 7 .  Patient is on ALF placement.  We will check PPD results.  Encouraged to participate in group milieu therapy if clinically stable. Continue one-to-one watch for patient safety.   Callum Wolf T., MD 03/22/2016, 1:00 PM

## 2016-03-22 NOTE — Progress Notes (Signed)
1:1 Note. Pt is currently seated in her wheel chair in her room. Pt is talk to staff, no fall observed or reported, maintained on 2:1 for safety, will continue to monitor.

## 2016-03-22 NOTE — [facility] Group Notes (Signed)
[facility] Group Notes: (Clinical Social Work)   03/22/2016      Type of Therapy:  Group Therapy   Participation Level:  Did Not Attend despite MHT prompting   Sargun Rummell Grossman-Orr, LCSW 03/22/2016, 12:15 PM     

## 2016-03-22 NOTE — Progress Notes (Signed)
1:1 Note. Pt is pacing on the hallway, agitated, irritable and labile. Pt has been threatening to leave and requires constant 2 person redirections. Pt is medicated as ordered by MD. Pt is high fall risks and difficult to follow fall precaution. No fall hs been reported or witness. Pt maintained on 1:1 for safety, pt remain safe, will continue to monitor.

## 2016-03-22 NOTE — Plan of Care (Signed)
Problem: Safety: Goal: Periods of time without injury will increase Outcome: Progressing Pt safe on the unit at this time   

## 2016-03-22 NOTE — Progress Notes (Signed)
Nursing 1:1 note D: RR even and unlabored. Pt got irritated and was verbally aggressive towards her 2:1. Pt appeared to feel like she was being trapped and seemed like she was being resistant to try to get away. Pt had to be redirected by writer and was given 2 mg Ativan PRN to help calm her down.  A: 1:1 observation continues for safety  R: pt remains safe

## 2016-03-22 NOTE — Progress Notes (Signed)
Pt has been up on the unit verbally aggressive, needing constant redirection with most staff. Pt has been calm and cooperative with Clinical research associatewriter, not verbally aggressive and easily redirectable . Pt took her medications without incident.

## 2016-03-22 NOTE — Progress Notes (Signed)
1:1 Note: Pt continues to be irritable, and agitated. Pt ate lunch without problems. Pt is in bed a sleep at this time. Maintained on 1:1 for safety,will continue to monitor.

## 2016-03-22 NOTE — Progress Notes (Signed)
Nursing 1:1 note D:Pt observed in room with talking loud, and anxious. PRN ativan 2 mg  administered. Will monitor for results. RR even and unlabored. No distress noted.  A: 1:1 observation continues for safety  R: pt remains safe

## 2016-03-22 NOTE — Progress Notes (Signed)
Nursing 1:1 note D:Pt observed sleeping in bed with eyes closed. RR even and unlabored. No distress noted. A: 1:1 observation continues for safety  R: pt remains safe  

## 2016-03-23 LAB — GLUCOSE, CAPILLARY
GLUCOSE-CAPILLARY: 77 mg/dL (ref 65–99)
GLUCOSE-CAPILLARY: 123 mg/dL — AB (ref 65–99)
Glucose-Capillary: 84 mg/dL (ref 65–99)
Glucose-Capillary: 161 mg/dL — ABNORMAL HIGH (ref 65–99)

## 2016-03-23 MED ORDER — OLANZAPINE 5 MG PO TABS
12.5000 mg | ORAL_TABLET | Freq: Every day | ORAL | Status: DC
  Administered 2016-03-23 – 2016-03-24 (×2): 12.5 mg via ORAL
  Filled 2016-03-23 (×4): qty 2.5

## 2016-03-23 NOTE — Progress Notes (Signed)
Nursing 1:1 note D:Pt observed singing with staff. RR even and unlabored. No distress noted.Pt used the bathroom on herself and female staff cleaned her up   A: 1:1 observation continues for safety  R: pt remains safe

## 2016-03-23 NOTE — Tx Team (Signed)
Interdisciplinary Treatment and Diagnostic Plan Update  03/23/2016 Time of Session: 5:08 PM  Catherine Bender MRN: [id number]  Principal Diagnosis: Schizoaffective disorder, bipolar type ([place])  Secondary Diagnoses: Principal Problem:   Schizoaffective disorder, bipolar type ([place]) Active Problems:   Prolonged Q-T interval on ECG   Current Medications:  Current Facility-Administered Medications  Medication Dose Route Frequency Provider Last Rate Last Dose  . acetaminophen (TYLENOL) tablet 650 mg  650 mg Oral Q6H PRN Laverle Hobby, PA-C   650 mg at 03/21/16 0650  . alum & mag hydroxide-simeth (MAALOX/MYLANTA) 200-200-20 MG/5ML suspension 30 mL  30 mL Oral Q4H PRN Laverle Hobby, PA-C   30 mL at 03/19/16 0021  . benztropine (COGENTIN) tablet 0.5 mg  0.5 mg Oral QHS Ursula Alert, MD   0.5 mg at 03/22/16 2046  . divalproex (DEPAKOTE) DR tablet 500 mg  500 mg Oral [id number] Ursula Alert, MD   500 mg at 03/23/16 1638  . eszopiclone (LUNESTA) tablet 3 mg  3 mg Oral QHS Ursula Alert, MD   3 mg at 03/22/16 2045  . gabapentin (NEURONTIN) capsule 300 mg  300 mg Oral TID Kathlee Nations, MD   300 mg at 03/23/16 1638  . insulin aspart (novoLOG) injection 0-15 Units  0-15 Units Subcutaneous TID WC Ursula Alert, MD   2 Units at 03/23/16 1222  . insulin aspart (novoLOG) injection 0-5 Units  0-5 Units Subcutaneous QHS Saramma Eappen, MD      . LORazepam (ATIVAN) tablet 2 mg  2 mg Oral Q6H PRN Encarnacion Slates, NP   2 mg at 03/23/16 1049   Or  . LORazepam (ATIVAN) injection 2 mg  2 mg Intramuscular Q6H PRN Encarnacion Slates, NP   2 mg at 03/21/16 1542  . magnesium hydroxide (MILK OF MAGNESIA) suspension 30 mL  30 mL Oral Daily PRN Laverle Hobby, PA-C      . nicotine polacrilex (NICORETTE) gum 2 mg  2 mg Oral PRN Saramma Eappen, MD      . OLANZapine (ZYPREXA) tablet 12.5 mg  12.5 mg Oral QHS Saramma Eappen, MD      . OLANZapine zydis (ZYPREXA) disintegrating tablet 5 mg  5 mg Oral Daily Ursula Alert,  MD   5 mg at 03/23/16 0750    PTA Medications: Prescriptions Prior to Admission  Medication Sig Dispense Refill Last Dose  . LITHIUM PO Take 1 tablet by mouth once.   Not Taking at Unknown time    Treatment Modalities: Medication Management, Group therapy, Case management,  1 to 1 session with clinician, Psychoeducation, Recreational therapy.   Physician Treatment Plan for Primary Diagnosis: Schizoaffective disorder, bipolar type ([place]) Long Term Goal(s): Improvement in symptoms so as ready for discharge  Short Term Goals: Compliance with prescribed medications will improve  Medication Management: Evaluate patient's response, side effects, and tolerance of medication regimen.  Therapeutic Interventions: 1 to 1 sessions, Unit Group sessions and Medication administration.  Evaluation of Outcomes: Progressing  Physician Treatment Plan for Secondary Diagnosis: Principal Problem:   Schizoaffective disorder, bipolar type ([place]) Active Problems:   Prolonged Q-T interval on ECG   Long Term Goal(s): Improvement in symptoms so as ready for discharge  Short Term Goals: Ability to demonstrate self-control will improve  Medication Management: Evaluate patient's response, side effects, and tolerance of medication regimen.  Therapeutic Interventions: 1 to 1 sessions, Unit Group sessions and Medication administration.  Evaluation of Outcomes: Not Met   RN Treatment Plan for Primary Diagnosis: Schizoaffective disorder,  bipolar type ([place]) Long Term Goal(s): Knowledge of disease and therapeutic regimen to maintain health will improve  Short Term Goals: Ability to demonstrate self-control and Compliance with prescribed medications will improve  Medication Management: RN will administer medications as ordered by provider, will assess and evaluate patient's response and provide education to patient for prescribed medication. RN will report any adverse and/or side effects to prescribing  provider.  Therapeutic Interventions: 1 on 1 counseling sessions, Psychoeducation, Medication administration, Evaluate responses to treatment, Monitor vital signs and CBGs as ordered, Perform/monitor CIWA, COWS, AIMS and Fall Risk screenings as ordered, Perform wound care treatments as ordered.  Evaluation of Outcomes: Progressing   LCSW Treatment Plan for Primary Diagnosis: Schizoaffective disorder, bipolar type ([place]) Long Term Goal(s): Safe transition to appropriate next level of care at discharge, Engage patient in therapeutic group addressing interpersonal concerns.  Short Term Goals: Engage patient in aftercare planning with referrals and resources and Increase skills for wellness and recovery  Therapeutic Interventions: Assess for all discharge needs, 1 to 1 time with Social worker, Explore available resources and support systems, Assess for adequacy in community support network, Educate family and significant other(s) on suicide prevention, Complete Psychosocial Assessment, Interpersonal group therapy.  Evaluation of Outcomes: Progressing   Progress in Treatment: Attending groups: No Participating in groups: No Taking medication as prescribed: Yes, MD continues to assess for medication changes as needed Toleration medication: Yes, no side effects reported at this time Family/Significant other contact made: Yes, [name] (daughter, [phone]) Patient understands diagnosis: No, limited insight Discussing patient identified problems/goals with staff: No Medical problems stabilized or resolved: Yes, patient now up and walking out of wheelchair Denies suicidal/homicidal ideation: Yes Issues/concerns per patient self-inventory: None Other: N/A  New problem(s) identified: discharge planning, currently homeless  New Short Term/Long Term Goal(s): Identify a safe discharge plan involving housing.    Discharge Plan or Barriers: Unknown at this time, CSW will continue to follow  and assess for options regarding placement  2/12: PASSR evaluation pending at this time; CSW to facilitate search for placement. MD requesting [place] referral be made  Reason for Continuation of Hospitalization: Anxiety Delusions  Hallucinations Medication stabilization Disorganization    Estimated Length of Stay: 3-5 days  Attendees: Patient:  03/23/2016  5:08 PM  Physician: Dr. Shea Evans 03/23/2016  5:08 PM  Nursing: Minta Balsam, RN  03/23/2016  5:08 PM  RN Care Manager: Lars Pinks, [place] RN 03/23/2016  5:08 PM  Social Worker: Reva Bores 03/23/2016  5:08 PM  Recreational Therapist: Laretta Bolster, RT 03/23/2016  5:08 PM  Other:  03/23/2016  5:08 PM  Other:  03/23/2016  5:08 PM  Other: 03/23/2016  5:08 PM    Scribe for Treatment Team: Adriana Reams, [place] Work [phone]

## 2016-03-23 NOTE — Progress Notes (Signed)
1-1 note Catherine Bender is in room and in bed at this time and is resting comfortably. Ivianna was up earlier, rambling, delusional and religiously preoccupied. Kylieann did allow staff to assist with ambulation and did allow CBG to be taken and did receive bedtime medications without incident. A. 1-1 continues at this time. R. Safety maintained at present, will continue to monitor.

## 2016-03-23 NOTE — [facility] Group Notes (Signed)
[facility] LCSW Group Therapy  03/23/2016 3:21 PM  Type of Therapy:  Group Therapy  Participation Level:  Did not attend   Modes of Intervention:  Activity, Discussion, Education, Socialization and Support  Summary of Progress/Problems: Patients identify obstacles, self-sabotaging and enabling behaviors. Patients explore aspects of self sabotage and enabling and how to limit these self-destructive behaviors in everyday life.   Barclay Lennox L Karam Dunson MSW, LCSWA  03/23/2016, 3:21 PM  

## 2016-03-23 NOTE — Progress Notes (Signed)
Nursing 1:1 note D:Pt observed sitting up in bed talking with staff. RR even and unlabored. No distress noted. A: 1:1 observation continues for safety  R: pt remains safe
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
## 2016-03-23 NOTE — Progress Notes (Signed)
1:1 note; Pt has been in the hall way talking to staff, pt required constant redirection, at some point get loud. No fall observed at this time, maintained on 1;1 observation, will continue to monitor.

## 2016-03-23 NOTE — Progress Notes (Signed)
1:1 Note: Pt is on the hallway waling using her walker and staff by her side. Pt is irritable, agitated and does not want staff to follow her. Pt requires constant redirection, no fall observed or reported. Pt maintained on 1:1 for safety. Will continue to monitor.

## 2016-03-23 NOTE — Progress Notes (Signed)
[facility]  03/23/2016 2:10 PM Delorse LimberRenitha Ohaver  MRN:  [id number] Subjective:  Patient states " I want to get my mails directed to my new address, I do not them following me around all the time. I am done with being said what to do."       Objective: Patient seen and chart reviewed.Discussed patient with treatment team.  Pt today seen as labile, anxious , pressured and tangential often, requiring constant redirection. Pt continues to have crying spells and is seen as ruminating constantly. Attempted to discuss treatment plan with pt - pt was receptive, agreed to follow it , but was seen as going off tangent soon after that. Pt with hx of qt prolongation , currently her EKG is being monitored , will continue to be cautious while increasing her medications. Pt also a high fall risk, she becomes confused and groggy when medications are increased and is also unsteady with gait , wears a gait belt and has 1;1 precaution to help her. CSW to continue to work on her placement.    Principal Problem: Schizoaffective disorder, bipolar type (HCC)  Diagnosis:   Patient Active Problem List   Diagnosis Date Noted  . Prolonged Q-T interval on ECG [R94.31] 03/13/2016  . Schizoaffective disorder, bipolar type (HCC) [F25.0] 03/12/2016  . Diabetes mellitus (HCC) [E11.9] 03/12/2016   Total Time spent with patient: 25 minutes  Past Psychiatric History: Please see H&P.   Past Medical History:  Past Medical History:  Diagnosis Date  . Diabetes mellitus without complication [facility](HCC)     Past Surgical History:  Procedure Laterality Date  . umbical hernia repair     Family History: Please see H&P.  Family Psychiatric  History: Please see H&P.  Social History: Please see H&P.  History  Alcohol Use No     History  Drug Use No    Social History   Social History  . Marital status: Single    Spouse name: N/A  . Number of children: N/A  . Years of education: N/A   Social History Main  Topics  . Smoking status: Current Every Day Smoker    Packs/day: 0.25  . Smokeless tobacco: Never Used  . Alcohol use No  . Drug use: No  . Sexual activity: Not Asked   Other Topics Concern  . None   Social History Narrative  . None   Additional Social History:                         Sleep: Fair  Appetite:  Fair  Current Medications: Current Facility-Administered Medications  Medication Dose Route Frequency Provider Last Rate Last Dose  . acetaminophen (TYLENOL) tablet 650 mg  650 mg Oral Q6H PRN Kerry HoughSpencer E Simon, PA-C   650 mg at 03/21/16 0650  . alum & mag hydroxide-simeth (MAALOX/MYLANTA) 200-200-20 MG/5ML suspension 30 mL  30 mL Oral Q4H PRN Kerry HoughSpencer E Simon, PA-C   30 mL at 03/19/16 0021  . benztropine (COGENTIN) tablet 0.5 mg  0.5 mg Oral QHS Jomarie LongsSaramma Luciel Brickman, MD   0.5 mg at 03/22/16 2046  . divalproex (DEPAKOTE) DR tablet 500 mg  500 mg Oral [id number] Julien Oscar, MD   500 mg at 03/23/16 0750  . eszopiclone (LUNESTA) tablet 3 mg  3 mg Oral QHS Jomarie LongsSaramma Garreth Burnsworth, MD   3 mg at 03/22/16 2045  . gabapentin (NEURONTIN) capsule 300 mg  300 mg Oral TID Cleotis NipperSyed T Arfeen, MD   300 mg  at 03/23/16 1223  . insulin aspart (novoLOG) injection 0-15 Units  0-15 Units Subcutaneous TID WC Jomarie Longs, MD   2 Units at 03/23/16 1222  . insulin aspart (novoLOG) injection 0-5 Units  0-5 Units Subcutaneous QHS Johnella Crumm, MD      . LORazepam (ATIVAN) tablet 2 mg  2 mg Oral Q6H PRN Sanjuana Kava, NP   2 mg at 03/23/16 1049   Or  . LORazepam (ATIVAN) injection 2 mg  2 mg Intramuscular Q6H PRN Sanjuana Kava, NP   2 mg at 03/21/16 1542  . magnesium hydroxide (MILK OF MAGNESIA) suspension 30 mL  30 mL Oral Daily PRN Kerry Hough, PA-C      . nicotine polacrilex (NICORETTE) gum 2 mg  2 mg Oral PRN Jomarie Longs, MD      . OLANZapine zydis (ZYPREXA) disintegrating tablet 10 mg  10 mg Oral QHS Jomarie Longs, MD   10 mg at 03/22/16 2046  . OLANZapine zydis (ZYPREXA) disintegrating  tablet 5 mg  5 mg Oral Daily Jomarie Longs, MD   5 mg at 03/23/16 0750    Lab Results:  Results for orders placed or performed during the hospital encounter of 03/11/16 (from the past 48 hour(s))  Glucose, capillary     Status: Abnormal   Collection Time: 03/21/16  5:01 PM  Result Value Ref Range   Glucose-Capillary 113 (H) 65 - 99 mg/dL  Glucose, capillary     Status: Abnormal   Collection Time: 03/21/16  8:38 PM  Result Value Ref Range   Glucose-Capillary 102 (H) 65 - 99 mg/dL  Glucose, capillary     Status: None   Collection Time: 03/22/16  6:13 AM  Result Value Ref Range   Glucose-Capillary 83 65 - 99 mg/dL  Glucose, capillary     Status: None   Collection Time: 03/22/16 11:31 AM  Result Value Ref Range   Glucose-Capillary 66 65 - 99 mg/dL  Glucose, capillary     Status: None   Collection Time: 03/22/16 12:00 PM  Result Value Ref Range   Glucose-Capillary 82 65 - 99 mg/dL  Glucose, capillary     Status: None   Collection Time: 03/22/16  4:39 PM  Result Value Ref Range   Glucose-Capillary 99 65 - 99 mg/dL  Glucose, capillary     Status: Abnormal   Collection Time: 03/22/16  8:33 PM  Result Value Ref Range   Glucose-Capillary 134 (H) 65 - 99 mg/dL  Glucose, capillary     Status: None   Collection Time: 03/23/16  6:33 AM  Result Value Ref Range   Glucose-Capillary 77 65 - 99 mg/dL  Glucose, capillary     Status: Abnormal   Collection Time: 03/23/16 11:59 AM  Result Value Ref Range   Glucose-Capillary 123 (H) 65 - 99 mg/dL    Blood Alcohol level:  Lab Results  Component Value Date   ETH <5 03/11/2016   ETH <5 01/05/2016    Metabolic Disorder Labs: Lab Results  Component Value Date   HGBA1C 5.2 03/13/2016   MPG 103 03/13/2016   Lab Results  Component Value Date   PROLACTIN 27.9 (H) 03/13/2016   Lab Results  Component Value Date   CHOL 150 03/13/2016   TRIG 69 03/13/2016   HDL 43 03/13/2016   CHOLHDL 3.5 03/13/2016   VLDL 14 03/13/2016   LDLCALC 93  03/13/2016    Physical Findings: AIMS: Facial and Oral Movements Muscles of Facial Expression: None, normal Lips  and Perioral Area: None, normal Jaw: None, normal Tongue: None, normal,Extremity Movements Upper (arms, wrists, hands, fingers): None, normal Lower (legs, knees, ankles, toes): None, normal, Trunk Movements Neck, shoulders, hips: None, normal, Overall Severity Severity of abnormal movements (highest score from questions above): None, normal Incapacitation due to abnormal movements: None, normal Patient's awareness of abnormal movements (rate only patient's report): No Awareness, Dental Status Current problems with teeth and/or dentures?: No Does patient usually wear dentures?: No  CIWA:    COWS:     Musculoskeletal: Strength & Muscle Tone: within normal limits Gait & Station: unsteady Patient leans: N/A  Psychiatric Specialty Exam: Physical Exam  Nursing note and vitals reviewed.   Review of Systems  Psychiatric/Behavioral: The patient is nervous/anxious.   All other systems reviewed and are negative.   Blood pressure (!) 154/107, pulse 84, temperature 98.2 F (36.8 C), temperature source Oral, resp. rate 16, height 5\' 5"  (1.651 m), weight 92.5 kg (204 lb), SpO2 97 %.Body mass index is 33.95 kg/m.  General Appearance: Guarded  Eye Contact:  Fair  Speech:  Pressured  Volume:  Increased on and off  Mood:  Anxious, Dysphoric and Irritable-on and off   Affect:  Labile and Tearful on and off   Thought Process:  Irrelevant and Descriptions of Associations: Tangential  Orientation:  Other:  to place, situation, self  Thought Content:  Delusions, Paranoid Ideation, Rumination and Tangential   Suicidal Thoughts:  No  Homicidal Thoughts:  No  Memory:  Immediate;   Fair Recent;   Poor Remote;   Poor  Judgement:  Impaired  Insight:  Shallow  Psychomotor Activity:  Restlessness  Concentration:  Concentration: Poor and Attention Span: Poor  Recall:  Poor  Fund of  Knowledge:  Poor  Language:  Fair  Akathisia:  No    AIMS (if indicated):     Assets:  Others:  access to healthcare  ADL's:  Impaired improving   Cognition:  WNL  Sleep:  Number of Hours: 5     Treatment Plan Summary: Pt seen with 1:1 precaution - continues to be labile , anxious , intrusive , ruminative , restless requiring redirection - her sleep is better , she is more redirectable today than yesterday - since qt is prolonged - will continue to be cautious with medication changes.   Schizoaffective disorder, bipolar type (HCC) unstable- Will continue today 03/23/16 plan as below except where it is noted.   Daily contact with patient to assess and evaluate symptoms and progress in treatment and Medication management Reviewed past medical records,treatment plan.   For schizoaffective do: Increase Zyprexa to 17.5  mg po daily in divided doses. Dose adjustment to be done cautiously due to h/o QTC prolongation as well as her inability to walk , drowsiness. ( per daughter - at baseline she takes care of self and is better functioning - is not wheelchair bound) . Repeat EKG ( 03/17/16)- shows qtc- improving - however likely due to dose reduction of offending agent. EKG today ( 03/19/16)- qtc wnl .  Cogentin 0.5 mg po qhs for EPS. Will continue Depakote DR 500 mg po tid for mood sx. Depakote level on 03/18/16- 83 ( therapeutic)  Will continue Neurontin 300 mg po qhs for anxiety/restlessness.  For insomnia: Discontinue Ambien due to lack of efficacy . Will continue  Lunesta  3 mg po qhs for sleep.  For hx of prolonged QTC: Repeat EKG showed qtc improving - will continue to monitor.Repeat EKG - 03/19/16- wnl .  Will repeat EKG today 03/23/16 - qt - 416 , qtc - 470 - will continue to monitor.  Given PPD test  03/19/16 - for ALF placement- - negative .   Will continue 1:1 precaution for safety reasons.  Will continue to monitor vitals ,medication compliance and treatment side effects while  patient is here.   Will monitor for medical issues as well as call consult as needed.   Reviewed labs ,will order as needed.   CSW will continue working on disposition. Patient to be referred to ALF /day program.  Patient to participate in therapeutic milieu .        Read Bonelli, MD 03/23/2016, 2:10 PM

## 2016-03-23 NOTE — Progress Notes (Signed)
1:1 Note. Pt continues to loud, irritable and agitated. Pt constantly asking to be discharged because she wants to go take care of her money. Pt remains on 1:1 observation for safety, no fall observed, will continue to monitor.

## 2016-03-23 NOTE — Progress Notes (Signed)
Recreation Therapy Notes  Date: 03/23/16 Time: 1000 Location: 500 Hall Dayroom  Group Topic: Self-Esteem  Goal Area(s) Addresses:  Patient will identify positive ways to increase self-esteem. Patient will verbalize benefit of increased self-esteem.  Intervention: Empty picture frame worksheet, markers, colored pencils  Activity: Mirror, Mirror.  Patients were given a worksheet with an empty picture frame on it.  Patients were to draw a picture and/or us words to describe what they think of themselves.  Education:  Self-Esteem, Discharge Planning.   Education Outcome: Acknowledges education/In group clarification offered/Needs additional education  Clinical Observations/Feedback: Pt did not attend group.   Zak Gondek, LRT/CTRS         Lashonda Sonneborn A 03/23/2016 11:51 AM 

## 2016-03-24 LAB — GLUCOSE, CAPILLARY
GLUCOSE-CAPILLARY: 97 mg/dL (ref 65–99)
Glucose-Capillary: 138 mg/dL — ABNORMAL HIGH (ref 65–99)
Glucose-Capillary: 93 mg/dL (ref 65–99)
Glucose-Capillary: 93 mg/dL (ref 65–99)

## 2016-03-24 MED ORDER — GABAPENTIN 400 MG PO CAPS
400.0000 mg | ORAL_CAPSULE | Freq: Every day | ORAL | Status: DC
  Administered 2016-03-24 – 2016-03-30 (×7): 400 mg via ORAL
  Filled 2016-03-24 (×8): qty 1

## 2016-03-24 MED ORDER — LORAZEPAM 2 MG/ML IJ SOLN: 1.0000 mg | Freq: Four times a day (QID) | INTRAMUSCULAR | Status: DC | PRN

## 2016-03-24 MED ORDER — GABAPENTIN 100 MG PO CAPS: 200.0000 mg | ORAL_CAPSULE | Freq: Three times a day (TID) | ORAL | Status: DC

## 2016-03-24 MED ORDER — HYDROCERIN EX CREA
TOPICAL_CREAM | Freq: Two times a day (BID) | CUTANEOUS | Status: AC
  Administered 2016-03-28 – 2016-03-31 (×6): via TOPICAL
  Administered 2016-03-31: 1 via TOPICAL
  Administered 2016-04-01 – 2016-04-06 (×12): via TOPICAL
  Administered 2016-04-07: 1 via TOPICAL
  Filled 2016-03-24: qty 113

## 2016-03-24 MED ORDER — LORAZEPAM 1 MG PO TABS
1.0000 mg | ORAL_TABLET | Freq: Four times a day (QID) | ORAL | Status: DC | PRN
  Administered 2016-03-24 – 2016-04-15 (×34): 1 mg via ORAL
  Filled 2016-03-24 (×34): qty 1

## 2016-03-24 MED ORDER — OXCARBAZEPINE 150 MG PO TABS
150.0000 mg | ORAL_TABLET | Freq: Two times a day (BID) | ORAL | Status: DC
  Administered 2016-03-24 – 2016-03-25 (×2): 150 mg via ORAL
  Filled 2016-03-24 (×6): qty 1

## 2016-03-24 MED ORDER — TEMAZEPAM 15 MG PO CAPS
15.0000 mg | ORAL_CAPSULE | Freq: Every day | ORAL | Status: DC
  Administered 2016-03-24: 15 mg via ORAL
  Filled 2016-03-24: qty 1

## 2016-03-24 NOTE — Progress Notes (Signed)
1:1 Note: Patient maintained on constant supervision for safety.  Patient is up and pacing back and forth on the unit restlessly.  Patient is verbally aggressive and abusive towards staff and when on the phone.  Patient is delusional, paranoid and volatile.  Patient continues to accuse staff of stealing from her and threatening to sue facility.  Patient is difficult to redirect.  Medications given as prescribed.  Ativan 2 mg given orally for severe agitation with poor effect.  Staff continues to redirect and offer patient support and encouragement as needed.  Patient seen by wound care nurse.  Diabetic skin ointment applied to bilateral feet for dry skin and linear fissures.  Patient safe with supervision on the unit.

## 2016-03-24 NOTE — Progress Notes (Signed)
Recreation Therapy Notes  03/24/16 1211:  LRT was talking with pt in the hallway.  Pt was talking about losing her credit cards and wanting to rent out a room.  Pt went to the dayroom and tried to make a phone call.  Pt appeared drowsy.  LRT spoke with pt about doing some coloring sheets.  At first pt stated she didn't like to color but then stated she would color the pictures for her granddaughter.  Pt went on rambling about various things.  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  Pt was confused and unable to focus.  Pt started eating her lunch so LRT left the coloring sheets in her room for her.  LRT will follow up with pt.  Caroll RancherMarjette Yolunda Kloos, LRT/CTRS      Lillia AbedLindsay, Paislei Dorval A 03/24/2016 1:14 PM

## 2016-03-24 NOTE — Progress Notes (Signed)
1-1 note [name] is in room and in bed and resting comfortably at this time. Catherine Bender did awake after midnight and have episode of incontinence. She needed assistance in cleaning herself and her bed. Catherine Bender, shortly thereafter, started to become loud and verbally aggressive and would ramble off various religious based ideas and also spoke about how she was suppose to get ready to go to work and then made some flirtatious comments about wanting to go get married with this Clinical research associatewriter. Catherine Bender needed much re-direction and did eventually take 2 mg of ativan before finally settling down. A. 1-1 continues at this time. R. Safety maintained at present, will continue to monitor.

## 2016-03-24 NOTE — Progress Notes (Signed)
Patient ID: Delorse LimberRenitha Bender, female   DOB: 04/05/1970, 46 y.o.   MRN: [id number] D: Client in bed initially, then wakes up yelling at [facility] "I don't want her in my room, she is evil" "she won't let me go pay my bills and I want her out of here" Client becomes belligerent, threatening staff, loud and demanding, storms out of her room, begins to pacing the hall. A: Write provided emotional support, redirected with much persuasion, from several staff members. Medications reviewed, administered as ordered. Staff will monitor 1:1 for safety. R: client is safe on the unit, did not attend group.

## 2016-03-24 NOTE — Progress Notes (Signed)
[facility]  03/24/2016 1:04 PM Catherine LimberRenitha Bender  MRN:  [id number] Subjective:  Patient states " I want to get home , get my mails. I know I am at the mental stability unit to get help and move forward.'        Objective:Patient seen and chart reviewed.Discussed patient with treatment team.  Pt today continues to be seen as anxious , labile often, rambling , ruminative on and on about her several psychosocial stressors.Pt is alert , oriented to the place and date - states that she is at the mental stability unit , in [place] , [place] and states the date as 2 nd month , 2018. Pt however continues to have periods when she goes off tangential and needs redirection. Pt continues to be on 1:1 precaution for labile, intrusive mood and fall risk. Pt per RN - had poor sleep last night, she is observed as taking short 10 minute naps throughout the day , but she wakes up abruptly and goes back to being loud and restless. Pt with qtc prolongation on EKG on admission , hence medications that can affect her qtc , including zyprexa can only be increased cautiously and with regular EKG monitoring. CSW to continue to work on ALF placement.    Principal Problem: Schizoaffective disorder, bipolar type (HCC)  Diagnosis:   Patient Active Problem List   Diagnosis Date Noted  . Prolonged Q-T interval on ECG [R94.31] 03/13/2016  . Schizoaffective disorder, bipolar type (HCC) [F25.0] 03/12/2016  . Diabetes mellitus (HCC) [E11.9] 03/12/2016   Total Time spent with patient: 25 minutes  Past Psychiatric History: Please see H&P.   Past Medical History:  Past Medical History:  Diagnosis Date  . Diabetes mellitus without complication [facility](HCC)     Past Surgical History:  Procedure Laterality Date  . umbical hernia repair     Family History: Please see H&P.  Family Psychiatric  History: Please see H&P.  Social History: Please see H&P.  History  Alcohol Use No     History  Drug Use No    Social  History   Social History  . Marital status: Single    Spouse name: N/A  . Number of children: N/A  . Years of education: N/A   Social History Main Topics  . Smoking status: Current Every Day Smoker    Packs/day: 0.25  . Smokeless tobacco: Never Used  . Alcohol use No  . Drug use: No  . Sexual activity: Not Asked   Other Topics Concern  . None   Social History Narrative  . None   Additional Social History:                         Sleep: Poor  Appetite:  Fair  Current Medications: Current Facility-Administered Medications  Medication Dose Route Frequency Provider Last Rate Last Dose  . acetaminophen (TYLENOL) tablet 650 mg  650 mg Oral Q6H PRN Kerry HoughSpencer E Simon, PA-C   650 mg at 03/21/16 0650  . alum & mag hydroxide-simeth (MAALOX/MYLANTA) 200-200-20 MG/5ML suspension 30 mL  30 mL Oral Q4H PRN Kerry HoughSpencer E Simon, PA-C   30 mL at 03/19/16 0021  . benztropine (COGENTIN) tablet 0.5 mg  0.5 mg Oral QHS Jomarie LongsSaramma Adlynn Lowenstein, MD   0.5 mg at 03/23/16 2108  . divalproex (DEPAKOTE) DR tablet 500 mg  500 mg Oral [id number] Jomarie LongsSaramma Leighanna Kirn, MD   500 mg at 03/24/16 0757  . gabapentin (NEURONTIN) capsule 400 mg  400 mg Oral QHS Jomarie Longs, MD      . Melene Muller ON 03/28/2016] hydrocerin (EUCERIN) cream   Topical BID [name], MD      . insulin aspart (novoLOG) injection 0-15 Units  0-15 Units Subcutaneous TID WC Jomarie Longs, MD   2 Units at 03/24/16 1610  . insulin aspart (novoLOG) injection 0-5 Units  0-5 Units Subcutaneous QHS Rontavious Albright, MD      . LORazepam (ATIVAN) tablet 1 mg  1 mg Oral Q6H PRN Jomarie Longs, MD       Or  . LORazepam (ATIVAN) injection 1 mg  1 mg Intramuscular Q6H PRN Tashyra Adduci, MD      . magnesium hydroxide (MILK OF MAGNESIA) suspension 30 mL  30 mL Oral Daily PRN Kerry Hough, PA-C      . nicotine polacrilex (NICORETTE) gum 2 mg  2 mg Oral PRN Jomarie Longs, MD      . OLANZapine (ZYPREXA) tablet 12.5 mg  12.5 mg Oral QHS Julien Oscar, MD    12.5 mg at 03/23/16 2110  . OLANZapine zydis (ZYPREXA) disintegrating tablet 5 mg  5 mg Oral Daily Jomarie Longs, MD   5 mg at 03/24/16 0757  . OXcarbazepine (TRILEPTAL) tablet 150 mg  150 mg Oral BID Jomarie Longs, MD      . temazepam (RESTORIL) capsule 15 mg  15 mg Oral QHS Jomarie Longs, MD        Lab Results:  Results for orders placed or performed during the hospital encounter of 03/11/16 (from the past 48 hour(s))  Glucose, capillary     Status: None   Collection Time: 03/22/16  4:39 PM  Result Value Ref Range   Glucose-Capillary 99 65 - 99 mg/dL  Glucose, capillary     Status: Abnormal   Collection Time: 03/22/16  8:33 PM  Result Value Ref Range   Glucose-Capillary 134 (H) 65 - 99 mg/dL  Glucose, capillary     Status: None   Collection Time: 03/23/16  6:33 AM  Result Value Ref Range   Glucose-Capillary 77 65 - 99 mg/dL  Glucose, capillary     Status: Abnormal   Collection Time: 03/23/16 11:59 AM  Result Value Ref Range   Glucose-Capillary 123 (H) 65 - 99 mg/dL  Glucose, capillary     Status: None   Collection Time: 03/23/16  5:11 PM  Result Value Ref Range   Glucose-Capillary 84 65 - 99 mg/dL  Glucose, capillary     Status: Abnormal   Collection Time: 03/23/16  8:40 PM  Result Value Ref Range   Glucose-Capillary 161 (H) 65 - 99 mg/dL   Comment 1 Notify RN   Glucose, capillary     Status: Abnormal   Collection Time: 03/24/16  6:04 AM  Result Value Ref Range   Glucose-Capillary 138 (H) 65 - 99 mg/dL  Glucose, capillary     Status: None   Collection Time: 03/24/16 11:20 AM  Result Value Ref Range   Glucose-Capillary 93 65 - 99 mg/dL    Blood Alcohol level:  Lab Results  Component Value Date   ETH <5 03/11/2016   ETH <5 01/05/2016    Metabolic Disorder Labs: Lab Results  Component Value Date   HGBA1C 5.2 03/13/2016   MPG 103 03/13/2016   Lab Results  Component Value Date   PROLACTIN 27.9 (H) 03/13/2016   Lab Results  Component Value Date   CHOL 150  03/13/2016   TRIG 69 03/13/2016   HDL 43  03/13/2016   CHOLHDL 3.5 03/13/2016   VLDL 14 03/13/2016   LDLCALC 93 03/13/2016    Physical Findings: AIMS: Facial and Oral Movements Muscles of Facial Expression: None, normal Lips and Perioral Area: None, normal Jaw: None, normal Tongue: None, normal,Extremity Movements Upper (arms, wrists, hands, fingers): None, normal Lower (legs, knees, ankles, toes): None, normal, Trunk Movements Neck, shoulders, hips: None, normal, Overall Severity Severity of abnormal movements (highest score from questions above): None, normal Incapacitation due to abnormal movements: None, normal Patient's awareness of abnormal movements (rate only patient's report): No Awareness, Dental Status Current problems with teeth and/or dentures?: No Does patient usually wear dentures?: No  CIWA:    COWS:     Musculoskeletal: Strength & Muscle Tone: within normal limits Gait & Station: unsteady Patient leans: N/A  Psychiatric Specialty Exam: Physical Exam  Nursing note and vitals reviewed.   Review of Systems  Psychiatric/Behavioral: Positive for depression. The patient is nervous/anxious and has insomnia.   All other systems reviewed and are negative.   Blood pressure (!) 145/95, pulse 85, temperature 98.2 F (36.8 C), temperature source Oral, resp. rate 16, height 5\' 5"  (1.651 m), weight 92.5 kg (204 lb), SpO2 97 %.Body mass index is 33.95 kg/m.  General Appearance: Guarded  Eye Contact:  Fair  Speech:  Pressured  Volume:  Increased on and off  Mood:  Anxious, Dysphoric and Irritable-on and off   Affect:  Labile and Tearful on and off   Thought Process:  Linear and Descriptions of Associations: Tangential  Orientation:  Other:  to place, situation, self  Thought Content:  Delusions, Paranoid Ideation, Rumination and Tangential   Suicidal Thoughts:  No  Homicidal Thoughts:  No  Memory:  Immediate;   Fair Recent;   Poor Remote;   Poor  Judgement:   Impaired  Insight:  Shallow  Psychomotor Activity:  Restlessness  Concentration:  Concentration: Poor and Attention Span: Poor  Recall:  Poor  Fund of Knowledge:  Poor  Language:  Fair  Akathisia:  No    AIMS (if indicated):     Assets:  Others:  access to healthcare  ADL's:  Impaired improving   Cognition:  WNL  Sleep:  Number of Hours: 4.5     Treatment Plan Summary:Patient today seen as alert, oriented to place and time , continues to be labile , is tangential often , tearful and loud at times , requiring PRN ativan. Pt also with poor sleep , will continue to readjust medications.   Schizoaffective disorder, bipolar type (HCC) unstable- Will continue today 03/24/16 plan as below except where it is noted.   Daily contact with patient to assess and evaluate symptoms and progress in treatment and Medication management Reviewed past medical records,treatment plan.   For schizoaffective do: Increased Zyprexa to 17.5  mg po daily in divided doses. Dose adjustment to be done cautiously due to h/o QTC prolongation as well as her inability to walk , drowsiness. ( per daughter - at baseline she takes care of self and is better functioning - is not wheelchair bound) . Repeat EKG ( 03/17/16)- shows qtc- improving - however likely due to dose reduction of offending agent. EKG  ( 03/19/16)- qtc wnl. Repeat EKG today since her dose was increased last night. .  Cogentin 0.5 mg po qhs for EPS. Will continue Depakote DR 500 mg po tid for mood sx. Depakote level on 03/18/16- 83 ( therapeutic)  Will change Neurontin to 400 mg po qhs for anxiety/restlessness.  For insomnia: Discontinue Ambien due to lack of efficacy . Discontinue  Lunesta due to lack of efficacy , failed ambien trial , due to qtc prolongation - cannot be on some of the other sleep aids like doxepin, elavil , failed trazodone . Will start Restoril 15 mg po qhs today.  For hx of prolonged QTC: Repeat EKG showed qtc improving - will  continue to monitor.Repeat EKG - 03/19/16- wnl .  03/23/16 - qt - 416 , qtc - 470 - will continue to monitor.Repeat EKG today.  Given PPD test  03/19/16 - for ALF placement- - negative .   Will continue 1:1 precaution for safety reasons.  Will continue to monitor vitals ,medication compliance and treatment side effects while patient is here.   Will monitor for medical issues as well as call consult as needed.   Reviewed labs ,will order as needed.   CSW will continue working on disposition. Patient to be referred to ALF /day program.  Patient to participate in therapeutic milieu .        Rapheal Masso, MD 03/24/2016, 1:04 PM

## 2016-03-24 NOTE — Progress Notes (Signed)
Adult Psychoeducational Group Note  Date:  03/24/2016 Time:  8:36 PM  Group Topic/Focus:  Wrap-Up Group:   The focus of this group is to help patients review their daily goal of treatment and discuss progress on daily workbooks.  Participation Level:  Did Not Attend   Engagement in Group:  Distracting   Additional Comments:  Pt is not appropriate to attend group at this time. Pt is psychotic and disruptive on the unit.  Caswell CorwinOwen, Arie Gable C 03/24/2016, 8:36 PM

## 2016-03-24 NOTE — Progress Notes (Signed)
1:1 Note: Patient maintained on constant supervision for safety.  Patient still pacing back and forth on the unit.  Patient continues to need redirection on the unit.  Patient continues to ruminates about personal belongings and family conflict.  Medication given as prescribed.  Patient safe with supervision.

## 2016-03-24 NOTE — Progress Notes (Signed)
Recreation Therapy Notes  Date: 03/24/16 Time: 1000 Location: 500 Hall Dayroom  Group Topic: Wellness  Goal Area(s) Addresses:  Patient will define components of whole wellness. Patient will verbalize benefit of whole wellness.  Intervention:  Chairs, beach ball   Activity: Keep it Going Volleyball.  Patients were arranged in Bender circle.  Patients were to hit the ball back and forth to each other without the ball coming to Bender complete stop.  LRT would keep count of how many hits the patients got before the ball stops.  Education:Wellness, Discharge Planning.   Education Outcome: Acknowledges education/In group clarification offered/Needs additional education.   Clinical Observations/Feedback:  Pt did not attend group.   Catherine Bender, LRT/CTRS        Catherine Bender 03/24/2016 11:50 AM 

## 2016-03-24 NOTE — Progress Notes (Signed)
1-1 note Catherine Bender has been up and down throughout much of the evening. At this time, she is in hallway and she is loud and verbally aggressive with staff. She is accusing them of stealing from her and trying to take her children. She is delusional and agitated at this time. Earlier in the evening, she had a similar presentation and began to yell at sitter and spoke about how we were trying to kill her. Catherine Bender has needed constant re-direction throughout the evening. Catherine Bender did allow morning CBG to be taken and did receive insulin without incident. A. 1-1 continues at this time. R. Safety maintained at present, will continue to monitor.

## 2016-03-24 NOTE — Progress Notes (Signed)
1:1 Note: Patient maintained on constant supervision for safety and fall risk.  Patient is in the hallway pacing back and forth.  Patient is anxious, irritable and tangential.  Medication given as prescribed. Patient is hyper religious and is seen singing and dancing in the hallway.  Routine safety checks continues.  Patient is safe with supervision.

## 2016-03-24 NOTE — [facility] Group Notes (Signed)
[facility] LCSW Group Therapy  03/24/2016 1:38 PM   Type of Therapy:  Group Therapy  Participation Level:  Active  Participation Quality:  Attentive  Affect:  Appropriate  Cognitive:  Appropriate  Insight:  Improving  Engagement in Therapy:  Engaged  Modes of Intervention:  Clarification, Education, Exploration and Socialization  Summary of Progress/Problems: Today's group focused on relapse prevention.  We defined the term, and then brainstormed on ways to prevent relapse. Pt was not invited to group.  However, she showed up about half way through, talking loudly and non-stop, Her 1:1 called for help, and she was eventually removed with the help of 4 staff members.  [name], Krishan Mcbreen B 03/24/2016 , 1:38 PM

## 2016-03-24 NOTE — Consult Note (Signed)
WOC Nurse wound consult note Reason for Consult: Bilateral heels with dry skin and linear fissures. Upon admission, patient's feet were washed and several layers of dry, peeling skin removed.  Linear fissures noted.  Consulted for daily care regimen. Wound type: Full thickness Pressure Injury POA: No Wound bed: Pink, moist tissue in fissures Drainage (amount, consistency, odor) None Periwound:Dry peellng skin with linear fissures on heels Dressing procedure/placement/frequency: I have provided a tube of emollient cream for twice daily use for four days.Following this, Eucerin cream has been ordered to continuing care of dry skin. WOC nursing team will not follow, but will remain available to this patient, the nursing and medical teams.  Please re-consult if needed. Thanks, Ladona MowLaurie Kaio Kuhlman, MSN, RN, GNP, Hans EdenCWOCN, CWON-AP, FAAN  Pager# [phone]

## 2016-03-25 LAB — GLUCOSE, CAPILLARY
GLUCOSE-CAPILLARY: 72 mg/dL (ref 65–99)
GLUCOSE-CAPILLARY: 92 mg/dL (ref 65–99)
GLUCOSE-CAPILLARY: 100 mg/dL — AB (ref 65–99)
GLUCOSE-CAPILLARY: 131 mg/dL — AB (ref 65–99)

## 2016-03-25 MED ORDER — OLANZAPINE 7.5 MG PO TABS
15.0000 mg | ORAL_TABLET | Freq: Every day | ORAL | Status: DC
  Filled 2016-03-25: qty 2

## 2016-03-25 MED ORDER — TEMAZEPAM 15 MG PO CAPS
30.0000 mg | ORAL_CAPSULE | Freq: Every day | ORAL | Status: DC
  Administered 2016-03-25 – 2016-04-15 (×22): 30 mg via ORAL
  Filled 2016-03-25 (×22): qty 2

## 2016-03-25 MED ORDER — HYDROXYZINE HCL 50 MG PO TABS
50.0000 mg | ORAL_TABLET | Freq: Once | ORAL | Status: AC
  Administered 2016-03-25: 50 mg via ORAL
  Filled 2016-03-25 (×2): qty 1

## 2016-03-25 MED ORDER — LORAZEPAM 1 MG PO TABS
2.0000 mg | ORAL_TABLET | Freq: Once | ORAL | Status: AC
  Administered 2016-03-25: 2 mg via ORAL
  Filled 2016-03-25: qty 2

## 2016-03-25 MED ORDER — HALOPERIDOL 5 MG PO TABS
5.0000 mg | ORAL_TABLET | Freq: Once | ORAL | Status: AC
  Administered 2016-03-25: 5 mg via ORAL
  Filled 2016-03-25 (×2): qty 1

## 2016-03-25 MED ORDER — OXCARBAZEPINE 300 MG PO TABS
300.0000 mg | ORAL_TABLET | Freq: Two times a day (BID) | ORAL | Status: DC
  Administered 2016-03-25 – 2016-03-30 (×10): 300 mg via ORAL
  Filled 2016-03-25 (×12): qty 1

## 2016-03-25 MED ORDER — OLANZAPINE 5 MG PO TBDP
15.0000 mg | ORAL_TABLET | Freq: Every day | ORAL | Status: DC
  Filled 2016-03-25 (×2): qty 3

## 2016-03-25 NOTE — Progress Notes (Signed)
Patient ID: Catherine LimberRenitha Bender, female   DOB: 11/09/1970, 46 y.o.   MRN: [id number] D: Client up agitated, labile, aggressive, "leave me alone, I need privacy" A: Writer staffed with Dianna LimboS. Simon, PA received ordered for Hydroxyzine 50 mg x 1. Staff to maintain 1:1 for safety. R: Client is safe on the unit.

## 2016-03-25 NOTE — Progress Notes (Signed)
1:1 observation note 1400-1800  Patient mood labile.  Patient will be very pleasant, then start crying and yelling saying stuff like "I gotta get my paycheck, they need to pay me for the work I do."  Patient needed PRN ativan x1.  Patient continues to need redirection, fairly steady on her feet but staff needs to be nearby, at times she will lean one way or another and needs to be guided back into balance.  Patient appears to be more pleasant and cooperative with female staff.  Tried to get off of unit once to go to gym but redirected.  Difficult to redirect.

## 2016-03-25 NOTE — Progress Notes (Signed)
Patient ID: Catherine LimberRenitha Bender, female   DOB: 03/13/1970, 46 y.o.   MRN: [id number] D: Client loud walking the halls, singing, tangential, loose associations, and restless. A: Writer provided emotional support, client redirected by staff. Medications reviewed, administered as ordered. Staff will monitor q3415min for safety. R: Client is safe on the unit.

## 2016-03-25 NOTE — Progress Notes (Signed)
1:1 Note: Patient maintained on constant supervision for safety.  Patient still presents with irritable affect and mood.  PRN Ativan 1 mg given for symptoms of severe agitation and anxiety with poor effect.  Patient continues to need lot of redirection on the unit.  Patient still preoccupied with discharge.  Routine safety checks continues.  Patient safe with supervision on the unit.

## 2016-03-25 NOTE — Progress Notes (Signed)
Patient ID: Catherine LimberRenitha Bines, female   DOB: 05/14/1970, 46 y.o.   MRN: [id number] D: Client up agitated, loud, aggressive to staff, leave me alone, get out of my room. A: Medications administered by Eula FriedMichelle S. RN Staff to continue 1:1 for safety. R: Client is safe on the unit.

## 2016-03-25 NOTE — Progress Notes (Signed)
1:1 Note: Patient maintained on constant supervision for safety.  Patient is alert and oriented x 4.  Patient continues to be loud, agitated, delusional, irritable and labile.  Patient verbally aggressive towards staff.  Continues to pace restlessly on the unit.  Patient continues to ruminates on family conflict.  Medication given as prescribed.  Routine safety checks maintained.  Received diabetic foot care.  Patient safe with supervision.
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
## 2016-03-25 NOTE — [facility] Group Notes (Signed)
[facility] LCSW Group Therapy  03/25/2016 2:40 PM   Type of Therapy:  Group Therapy   Participation Level:  Engaged  Participation Quality:  Attentive  Affect:  Appropriate   Cognitive:  Alert   Insight:  Engaged  Engagement in Therapy:  Improving   Modes of Intervention:  Education, Exploration, Socialization   Summary of Progress/Problems: Catherine Bender came to group during the last 10 minutes of the session. During her stay she was engaged and was not disruptive as she sanged along with David's guitar playing.   [name] from [employer] was here to tell his story of recovery, inform patients about [employer] and play his guitar.   [name] 03/25/2016 2:40 PM

## 2016-03-25 NOTE — Progress Notes (Signed)
Recreation Therapy Notes  Date: 03/25/16 Time: 1000 Location: [address] Dayroom  Group Topic: Leisure Education  Goal Area(s) Addresses:  Patient will identify positive leisure activities.  Patient will identify one positive benefit of participation in leisure activities.   Behavioral Response: Minimal  Intervention: Various activities on pieces of paper, dry erase marker, dry erase board, eraser  Activity: Leisure Pictionary.  Patients were to pick an activity from the can.  Patients were to then draw a picture of the activity on the board. The remaining patients were to try and guess what the picture was.  The first person to guess the picture correctly would then get a chance to go next.  Education:  Leisure Education, Building control surveyorDischarge Planning  Education Outcome: Acknowledges education/In group clarification offered/Needs additional education  Clinical Observations/Feedback: Pt appeared drowsy.  Pt stated that leisure "gives your mind a break to rejuvenate so the devil can't control your mind."  Pt left early and did not return.   Caroll RancherMarjette Cahlil Sattar, LRT/CTRS     Lillia AbedLindsay, Glady Ouderkirk A 03/25/2016 11:40 AM

## 2016-03-26 LAB — POTASSIUM: Potassium: 3.9 mmol/L (ref 3.5–5.1)

## 2016-03-26 LAB — MAGNESIUM: Magnesium: 1.8 mg/dL (ref 1.7–2.4)

## 2016-03-26 LAB — GLUCOSE, CAPILLARY
GLUCOSE-CAPILLARY: 94 mg/dL (ref 65–99)
GLUCOSE-CAPILLARY: 91 mg/dL (ref 65–99)
Glucose-Capillary: 75 mg/dL (ref 65–99)
Glucose-Capillary: 89 mg/dL (ref 65–99)

## 2016-03-26 MED ORDER — BENZTROPINE MESYLATE 1 MG PO TABS
1.0000 mg | ORAL_TABLET | Freq: Every day | ORAL | Status: DC
  Administered 2016-03-26 – 2016-04-15 (×21): 1 mg via ORAL
  Filled 2016-03-26 (×9): qty 1
  Filled 2016-03-26: qty 7
  Filled 2016-03-26 (×14): qty 1

## 2016-03-26 MED ORDER — OLANZAPINE 10 MG PO TBDP
20.0000 mg | ORAL_TABLET | Freq: Every day | ORAL | Status: DC
  Administered 2016-03-26 – 2016-04-15 (×21): 20 mg via ORAL
  Filled 2016-03-26 (×10): qty 2
  Filled 2016-03-26: qty 14
  Filled 2016-03-26 (×14): qty 2

## 2016-03-26 MED ORDER — PROPRANOLOL HCL 10 MG PO TABS
10.0000 mg | ORAL_TABLET | Freq: Two times a day (BID) | ORAL | Status: DC
  Administered 2016-03-26 – 2016-04-03 (×16): 10 mg via ORAL
  Filled 2016-03-26 (×18): qty 1

## 2016-03-26 NOTE — Progress Notes (Signed)
1:1 Note: Patient maintained on constant supervision for safety.  Patient mood remained labile.  Restless on the unit, loud and verbally aggressive towards staff.  Patient still preoccupied with discharge.  Medication given as prescribed.  Need a lot of redirection.  Routine safety checks continues.  Patient is safe on the unit with supervision.

## 2016-03-26 NOTE — Progress Notes (Signed)
Recreation Therapy Notes  Date: 03/26/16 Time: 1000 Location: [address] Dayroom  Group Topic: Coping Skills  Goal Area(s) Addresses:  Patient will be able to identify positive coping skills. Patient will be able to identify benefits of using coping skills post d/c.  Intervention: Magazines, scissors, glue sticks, worksheets, construction paper  Activity:  Coping Skills.  Patients were given a worksheet divided into five sections (diversions, social, cognitive, tension releasers and physical).  Patients were to locate coping skills for each area in the magazines provided.  Patients were to then glue the coping skill to the corresponding areas.  Education:Coping Skills, Discharge Planning.   Education Outcome: Acknowledges understanding/In group clarification offered/Needs additional education.   Clinical Observations/Feedback: Pt did not attend group.   Lillith Mcneff, LRT/CTRS         Vianny Schraeder A 03/26/2016 12:39 PM 
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
## 2016-03-26 NOTE — Progress Notes (Signed)
1:1 Note: Patient maintained on constant supervision for safety.  Patient is up in the hallway in a wheelchair self propelling.  Patient is loud, anxious and irritable.  Ativan 0.5 mg given for severe anxiety and agitation with fair effect.  Support and encouragement offered as needed.  Routine safety checks continues.  Patient safe with supervision.

## 2016-03-26 NOTE — [facility] Group Notes (Signed)
[facility] Group Notes:  (Counselor/Nursing/MHT/Case Management/Adjunct)  03/26/2016 1:15PM  Type of Therapy:  Group Therapy  Participation Level:  Active  Participation Quality:  Appropriate  Affect:  Flat  Cognitive:  Oriented  Insight:  Improving  Engagement in Group:  Limited  Engagement in Therapy:  Limited  Modes of Intervention:  Discussion, Exploration and Socialization  Summary of Progress/Problems: The topic for group was balance in life.  Pt participated in the discussion about when their life was in balance and out of balance and how this feels.  Pt discussed ways to get back in balance and short term goals they can work on to get where they want to be. Was preoccupied with making a phone call.  Chose to not attend.   [name], Drina Jobst B 03/26/2016 1:06 PM

## 2016-03-26 NOTE — Tx Team (Signed)
Interdisciplinary Treatment and Diagnostic Plan Update  03/26/2016 Time of Session: 9:20 AM  Catherine Bender MRN: [id number]  Principal Diagnosis: Schizoaffective disorder, bipolar type (HCC)  Secondary Diagnoses: Principal Problem:   Schizoaffective disorder, bipolar type (HCC) Active Problems:   Prolonged Q-T interval on ECG   Current Medications:  Current Facility-Administered Medications  Medication Dose Route Frequency Provider Last Rate Last Dose  . acetaminophen (TYLENOL) tablet 650 mg  650 mg Oral Q6H PRN Kerry Hough, PA-C   650 mg at 03/25/16 1635  . alum & mag hydroxide-simeth (MAALOX/MYLANTA) 200-200-20 MG/5ML suspension 30 mL  30 mL Oral Q4H PRN Kerry Hough, PA-C   30 mL at 03/19/16 0021  . benztropine (COGENTIN) tablet 0.5 mg  0.5 mg Oral QHS Saramma Eappen, MD   0.5 mg at 03/25/16 2345  . divalproex (DEPAKOTE) DR tablet 500 mg  500 mg Oral [id number] Jomarie Longs, MD   500 mg at 03/26/16 0752  . gabapentin (NEURONTIN) capsule 400 mg  400 mg Oral QHS Jomarie Longs, MD   400 mg at 03/25/16 2345  . [START ON 03/28/2016] hydrocerin (EUCERIN) cream   Topical BID Saramma Eappen, MD      . insulin aspart (novoLOG) injection 0-15 Units  0-15 Units Subcutaneous TID WC Jomarie Longs, MD   2 Units at 03/24/16 8657  . insulin aspart (novoLOG) injection 0-5 Units  0-5 Units Subcutaneous QHS Saramma Eappen, MD      . LORazepam (ATIVAN) tablet 1 mg  1 mg Oral Q6H PRN Jomarie Longs, MD   1 mg at 03/26/16 8469   Or  . LORazepam (ATIVAN) injection 1 mg  1 mg Intramuscular Q6H PRN Saramma Eappen, MD      . magnesium hydroxide (MILK OF MAGNESIA) suspension 30 mL  30 mL Oral Daily PRN Kerry Hough, PA-C      . nicotine polacrilex (NICORETTE) gum 2 mg  2 mg Oral PRN Jomarie Longs, MD      . OLANZapine zydis (ZYPREXA) disintegrating tablet 15 mg  15 mg Oral QHS Saramma Eappen, MD      . OLANZapine zydis (ZYPREXA) disintegrating tablet 5 mg  5 mg Oral Daily Jomarie Longs, MD   5  mg at 03/26/16 0757  . Oxcarbazepine (TRILEPTAL) tablet 300 mg  300 mg Oral BID Jomarie Longs, MD   300 mg at 03/26/16 0752  . temazepam (RESTORIL) capsule 30 mg  30 mg Oral QHS Jomarie Longs, MD   30 mg at 03/25/16 2342    PTA Medications: Prescriptions Prior to Admission  Medication Sig Dispense Refill Last Dose  . LITHIUM PO Take 1 tablet by mouth once.   Not Taking at Unknown time    Treatment Modalities: Medication Management, Group therapy, Case management,  1 to 1 session with clinician, Psychoeducation, Recreational therapy.   Physician Treatment Plan for Primary Diagnosis: Schizoaffective disorder, bipolar type (HCC) Long Term Goal(s): Improvement in symptoms so as ready for discharge  Short Term Goals: Compliance with prescribed medications will improve  Medication Management: Evaluate patient's response, side effects, and tolerance of medication regimen.  Therapeutic Interventions: 1 to 1 sessions, Unit Group sessions and Medication administration.  Evaluation of Outcomes: Progressing   2/15:  Will increase  Zyprexa to 20  mg po daily in divided doses. Dose adjustment to be done cautiously due to h/o QTC prolongation as well as her inability to walk , drowsiness. ( per daughter - at baseline she takes care of self and is better functioning -  is not wheelchair bound) . Repeat EKG ( 03/17/16)- shows qtc- improving - however likely due to dose reduction of offending agent. EKG  ( 03/19/16)- qtc wnl. EKG - 03/25/16 - qtc - wnl . Marland Kitchen.  Cogentin 0.5 mg po qhs for EPS. Will continue Depakote DR 500 mg po tid for mood sx. Depakote level on 03/18/16- 83 ( therapeutic)  Will continue Neurontin 400 mg po qhs for anxiety/restlessness. Will increase Trilpetal to 300 mg po bid - initiated yesterday at 150 mg po bid . She will receive an increase of 300 mg today and 300 mg bid tomorrow.  For insomnia: Discontinue Ambien due to lack of efficacy . Discontinue  Lunesta due to lack of efficacy  , failed ambien trial , due to qtc prolongation - cannot be on some of the other sleep aids like doxepin, elavil , failed trazodone . Will increase Restoril to 30  mg po qhs today.  For hx of prolonged QTC: Repeat EKG showed qtc improving - will continue to monitor.Repeat EKG - 03/19/16- wnl .  03/23/16 - qt - 416 , qtc - 470 - will continue to monitor.Repeat EKG today- 03/25/16- qtc - wnl.  Physician Treatment Plan for Secondary Diagnosis: Principal Problem:   Schizoaffective disorder, bipolar type (HCC) Active Problems:   Prolonged Q-T interval on ECG   Long Term Goal(s): Improvement in symptoms so as ready for discharge  Short Term Goals: Ability to demonstrate self-control will improve  Medication Management: Evaluate patient's response, side effects, and tolerance of medication regimen.  Therapeutic Interventions: 1 to 1 sessions, Unit Group sessions and Medication administration.  Evaluation of Outcomes: Progressing   RN Treatment Plan for Primary Diagnosis: Schizoaffective disorder, bipolar type (HCC) Long Term Goal(s): Knowledge of disease and therapeutic regimen to maintain health will improve  Short Term Goals: Ability to demonstrate self-control and Compliance with prescribed medications will improve  Medication Management: RN will administer medications as ordered by provider, will assess and evaluate patient's response and provide education to patient for prescribed medication. RN will report any adverse and/or side effects to prescribing provider.  Therapeutic Interventions: 1 on 1 counseling sessions, Psychoeducation, Medication administration, Evaluate responses to treatment, Monitor vital signs and CBGs as ordered, Perform/monitor CIWA, COWS, AIMS and Fall Risk screenings as ordered, Perform wound care treatments as ordered.  Evaluation of Outcomes: Progressing   LCSW Treatment Plan for Primary Diagnosis: Schizoaffective disorder, bipolar type (HCC) Long Term  Goal(s): Safe transition to appropriate next level of care at discharge, Engage patient in therapeutic group addressing interpersonal concerns.  Short Term Goals: Engage patient in aftercare planning with referrals and resources and Increase skills for wellness and recovery  Therapeutic Interventions: Assess for all discharge needs, 1 to 1 time with Social worker, Explore available resources and support systems, Assess for adequacy in community support network, Educate family and significant other(s) on suicide prevention, Complete Psychosocial Assessment, Interpersonal group therapy.  Evaluation of Outcomes: Progressing   Progress in Treatment: Attending groups: No Participating in groups: No Taking medication as prescribed: Yes, MD continues to assess for medication changes as needed Toleration medication: Yes, no side effects reported at this time Family/Significant other contact made: Yes, Ms. Delene Lollarielle (daughter, [phone]) Patient understands diagnosis: No, limited insight Discussing patient identified problems/goals with staff: No Medical problems stabilized or resolved: Yes, patient now up and walking out of wheelchair Denies suicidal/homicidal ideation: Yes Issues/concerns per patient self-inventory: None Other: N/A  New problem(s) identified: discharge planning, currently homeless  New Short Term/Long Term  Goal(s): Identify a safe discharge plan involving housing.    Discharge Plan or Barriers: Unknown at this time, CSW will continue to follow and assess for options regarding placement  2/12: [facility]SR evaluation pending at this time; CSW to facilitate search for placement. MD requesting [facility] referral be made 2/15:  P[facility]SR evaluation on hold; pt not stabilized to the point of readiness for transfer.  Confirmation of [facility] wait list as of 2/14.  CSW attempting to contact mother to involve in dispositional planning  Reason for Continuation of Hospitalization: Anxiety Delusions   Medication stabilization Disorganization    Estimated Length of Stay: 3-5 days  Attendees: Patient:  03/26/2016  9:20 AM  Physician: Dr. Elna Breslow 03/26/2016  9:20 AM  Nursing: Esperanza Sheets, RN  03/26/2016  9:20 AM  RN Care Manager: Onnie Boer, CM RN 03/26/2016  9:20 AM  Social Worker: Richelle Ito LCSW 03/26/2016  9:20 AM  Recreational Therapist: Royston Cowper, RT 03/26/2016  9:20 AM  Other:  03/26/2016  9:20 AM  Other:  03/26/2016  9:20 AM  Other: 03/26/2016  9:20 AM    Scribe for Treatment Team: Richelle Ito, LCSW Clinical Social Work [phone]

## 2016-03-27 DIAGNOSIS — E878 Other disorders of electrolyte and fluid balance, not elsewhere classified: Secondary | ICD-10-CM | POA: Diagnosis present

## 2016-03-27 LAB — GLUCOSE, CAPILLARY
GLUCOSE-CAPILLARY: 89 mg/dL (ref 65–99)
GLUCOSE-CAPILLARY: 90 mg/dL (ref 65–99)
Glucose-Capillary: 147 mg/dL — ABNORMAL HIGH (ref 65–99)
Glucose-Capillary: 127 mg/dL — ABNORMAL HIGH (ref 65–99)

## 2016-03-27 MED ORDER — MAGNESIUM OXIDE 400 (241.3 MG) MG PO TABS
400.0000 mg | ORAL_TABLET | Freq: Every day | ORAL | Status: DC
  Administered 2016-03-27 – 2016-04-09 (×14): 400 mg via ORAL
  Filled 2016-03-27 (×16): qty 1

## 2016-03-27 NOTE — Progress Notes (Signed)
1:1 Note: Patient maintained on constant supervision for safety and fall risk.  Patient on the phone loud and agitated.  Patient verbally aggressive towards female staff.  Ativan 1 mg given for agitation with fair effect.  Staff continues to offer support and encouragement as needed.  Patient is safe on the unit with supervision.

## 2016-03-27 NOTE — Progress Notes (Signed)
1:1 Note: Patient maintained on constant supervision for safety and fall risk.  Patient up in the hallway singing and dancing.  Mood at times is labile, irritable and agitated.  Medication given as prescribed.  Staff continues to offer support and encouragement as needed.  Patient remained preoccupied with getting discharge from the hospital.  Patient safe with supervision.

## 2016-03-27 NOTE — Plan of Care (Signed)
Problem: Safety: Goal: Ability to redirect hostility and anger into socially appropriate behaviors will improve Outcome: Progressing Ability to redirect hostility and anger into socially appropriate behaviors will improve with redirections and reorienting from staff.

## 2016-03-27 NOTE — Progress Notes (Signed)
1:1 Note: Patient maintained on constant supervision for safety and fall risk.  Patient walking in the hallway very loud, irritable and labile.  Medication given as prescribed.  Patient continues to ruminates about losing her apartment and the need to pay her rent. Patient is difficult to redirect at times.  Routine safety checks continues.  Patient safe with supervision on the unit.

## 2016-03-27 NOTE — Progress Notes (Signed)
1:1 note   Pt has been visible on the milieu    She has been singing very loud  Hypomanic behaviors, grandiose hyperverbal with pressured speech ,flight of ideas but is very pleasant    She is redirectable    She is steady on her feet   She took her medications without issue    Pt is on a 1:1 for safety and behavioral issues   She is safe at this time

## 2016-03-27 NOTE — [facility] Group Notes (Signed)
[facility] LCSW Group Therapy  03/27/2016 1:12 PM   Type of Therapy:  Group Therapy  Participation Level:  Active  Participation Quality:  Attentive  Affect:  Appropriate  Cognitive:  Appropriate  Insight:  Improving  Engagement in Therapy:  Engaged  Modes of Intervention:  Clarification, Education, Exploration and Socialization  Summary of Progress/Problems: Today's group focused on relapse prevention.  We defined the term, and then brainstormed on ways to prevent relapse. Evonne was ratcheting up as group was beginning-standing at door and loudly demanding to be released.  Given a PRN.  Did not come to group.  [name], Ladarrion Telfair B 03/27/2016 , 1:12 PM

## 2016-03-27 NOTE — Progress Notes (Signed)
Recreation Therapy Notes  Date: 03/27/16 Time: 1000 Location: 500 Hall Dayroom   Group Topic: Communication, Team Building, Problem Solving  Goal Area(s) Addresses:  Patient will effectively work with peer towards shared goal.  Patient will identify skill used to make activity successful.  Patient will identify how skills used during activity can be used to reach post d/c goals.   Intervention: STEM Activity   Activity: Moon Landing. Patients were provided the following materials: 5 drinking straws, 5 rubber bands, 5 paper clips, 2 index cards, 2 drinking cups, and 2 toilet paper rolls. Using the provided materials patients were asked to build a launching mechanisms to launch a ping pong ball approximately 12 feet. Patients were divided into teams of 3-5.   Education: Social Skills, Discharge Planning.   Education Outcome: Acknowledges education/In group clarification offered/Needs additional education.   Clinical Observations/Feedback: Pt did not attend group.   Runa Whittingham, LRT/CTRS         Delmar Arriaga A 03/27/2016 11:27 AM 

## 2016-03-27 NOTE — Progress Notes (Addendum)
[facility] MD Progress Note  03/27/2016 2:44 PM Nelma Phagan  MRN:  [id number] Subjective:  Patient states " I am crying because my grandson's father smoked weed in front of him and that is not appropriate."   Objective:Patient seen and chart reviewed.Discussed patient with treatment team.  Pt today seen as calm initially in the AM , however later on received a phonecall , was upset after she spoke to someone. Pt when asked about this reported that her grandson saw his dad smoking weed in front of him and she is upset since that is not appropriate.  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  Pt per RN slept better last night - >5 hrs which is an improvement for her since not sleeping at all was a major concern since admission.  Pt does well with selective staff on the unit - usually female MHTs - she is often seen as singing and  Cheerful with them .Pt responds well to music and that also keeps her calm often.  Pt is alert, oriented -was able to state the month as feb, year as 2018 and place as [facility] health facility. Pt noted as making progress , although her being loud and rambling on and off on the unit continues. This is also likely behavioral - since pt understands she is on 1:1 precaution , but she does not like it and rambles on cries to get her 1:1 sitter to stay away.  Pt with EKG - QTC prolongation on admission - will repeat EKG again tomorrow - Saturday. Pt had Mg, K+ repeated - consulted hospitalist - who recommended starting patient on magnesium oxide 400 mg daily and then repeating BMP, mg on Monday.     Principal Problem: Schizoaffective disorder, bipolar type (HCC)  Diagnosis:   Patient Active Problem List   Diagnosis Date Noted  . Electrolyte imbalance [E87.8] 03/27/2016  .  Prolonged Q-T interval on ECG [R94.31] 03/13/2016  . Schizoaffective disorder, bipolar type (HCC) [F25.0] 03/12/2016  . Diabetes mellitus (HCC) [E11.9] 03/12/2016   Total Time spent with patient: 30 minutes  Past Psychiatric History: Please see H&P.   Past Medical History:  Past Medical History:  Diagnosis Date  . Diabetes mellitus without complication [facility])     Past Surgical History:  Procedure Laterality Date  . umbical hernia repair     Family History: Please see H&P.  Family Psychiatric  History: Please see H&P.  Social History: Please see H&P.  History  Alcohol Use No     History  Drug Use No    Social History   Social History  . Marital status: Single    Spouse name: N/A  . Number of children: N/A  . Years of education: N/A   Social History Main Topics  . Smoking status: Current Every Day Smoker    Packs/day: 0.25  . Smokeless tobacco: Never Used  . Alcohol use No  . Drug use: No  . Sexual activity: Not Asked   Other Topics Concern  . None   Social History Narrative  . None   Additional Social History:                         Sleep: improved last night >5  hrs   Appetite:  Fair  Current Medications: Current Facility-Administered Medications  Medication Dose Route Frequency Provider Last Rate Last Dose  . acetaminophen (TYLENOL) tablet 650 mg  650 mg Oral Q6H PRN Kerry Hough, PA-C   650 mg at 03/27/16 1610  . alum & mag hydroxide-simeth (MAALOX/MYLANTA) 200-200-20 MG/5ML suspension 30 mL  30 mL Oral Q4H PRN Kerry Hough, PA-C   30 mL at 03/19/16 0021  . benztropine (COGENTIN) tablet 1 mg  1 mg Oral QHS Jomarie Longs, MD   1 mg at 03/26/16 2325  . divalproex (DEPAKOTE) DR tablet 500 mg  500 mg Oral [id number] Jomarie Longs, MD   500 mg at 03/27/16 9604  . gabapentin (NEURONTIN) capsule 400 mg  400 mg Oral QHS Jomarie Longs, MD   400 mg at 03/26/16 2325  . [START ON 03/28/2016] hydrocerin (EUCERIN) cream   Topical BID Allean Montfort, MD      . insulin aspart (novoLOG) injection 0-15 Units  0-15 Units Subcutaneous TID WC Jomarie Longs, MD   2 Units at 03/27/16 1149  . insulin aspart (novoLOG) injection 0-5 Units  0-5 Units Subcutaneous QHS Kimm Ungaro, MD      . LORazepam (ATIVAN) tablet 1 mg  1 mg Oral Q6H PRN Jomarie Longs, MD   1 mg at 03/27/16 1151   Or  . LORazepam (ATIVAN) injection 1 mg  1 mg Intramuscular Q6H PRN Kortnie Stovall, MD      . magnesium hydroxide (MILK OF MAGNESIA) suspension 30 mL  30 mL Oral Daily PRN Kerry Hough, PA-C      . magnesium oxide (MAG-OX) tablet 400 mg  400 mg Oral Daily Momo Braun, MD      . nicotine polacrilex (NICORETTE) gum 2 mg  2 mg Oral PRN Jomarie Longs, MD      . OLANZapine zydis (ZYPREXA) disintegrating tablet 20 mg  20 mg Oral QHS Jomarie Longs, MD   20 mg at 03/26/16 2326  . OLANZapine zydis (ZYPREXA) disintegrating tablet 5 mg  5 mg Oral Daily Jomarie Longs, MD   5 mg at 03/27/16 0813  . Oxcarbazepine (TRILEPTAL) tablet 300 mg  300 mg Oral BID Jomarie Longs, MD   300 mg at 03/27/16 0813  . propranolol (INDERAL) tablet 10 mg  10 mg Oral BID Jomarie Longs, MD   10 mg at 03/27/16 0811  . temazepam (RESTORIL) capsule 30 mg  30 mg Oral QHS Jomarie Longs, MD   30 mg at 03/26/16 2327    Lab Results:  Results for orders placed or performed during the hospital encounter of 03/11/16 (from the past 48 hour(s))  Glucose, capillary     Status: Abnormal   Collection Time: 03/25/16  4:55 PM  Result Value Ref Range   Glucose-Capillary 100 (H) 65 - 99 mg/dL   Comment 1 Notify RN    Comment 2 Document in Chart   Glucose, capillary     Status: Abnormal   Collection Time: 03/25/16  8:17 PM  Result Value Ref Range   Glucose-Capillary 131 (H) 65 - 99 mg/dL  Glucose, capillary     Status: None   Collection Time: 03/26/16  5:36 AM  Result Value Ref Range   Glucose-Capillary 94 65 - 99 mg/dL  Glucose, capillary     Status: None   Collection Time: 03/26/16 11:54 AM   Result Value Ref Range   Glucose-Capillary 91 65 - 99 mg/dL  Glucose, capillary     Status: None  Collection Time: 03/26/16  5:34 PM  Result Value Ref Range   Glucose-Capillary 75 65 - 99 mg/dL   Comment 1 Notify RN   Potassium     Status: None   Collection Time: 03/26/16  6:13 PM  Result Value Ref Range   Potassium 3.9 3.5 - 5.1 mmol/L    Comment: Performed at [facility], [address], [place], [place] [address]  Magnesium     Status: None   Collection Time: 03/26/16  6:13 PM  Result Value Ref Range   Magnesium 1.8 1.7 - 2.4 mg/dL    Comment: Performed at [facility], [address], [place], [place] [address]  Glucose, capillary     Status: None   Collection Time: 03/26/16  9:25 PM  Result Value Ref Range   Glucose-Capillary 89 65 - 99 mg/dL  Glucose, capillary     Status: None   Collection Time: 03/27/16  5:41 AM  Result Value Ref Range   Glucose-Capillary 89 65 - 99 mg/dL  Glucose, capillary     Status: Abnormal   Collection Time: 03/27/16 11:44 AM  Result Value Ref Range   Glucose-Capillary 147 (H) 65 - 99 mg/dL   Comment 1 Notify RN    Comment 2 Document in Chart     Blood Alcohol level:  Lab Results  Component Value Date   ETH <5 03/11/2016   ETH <5 01/05/2016    Metabolic Disorder Labs: Lab Results  Component Value Date   HGBA1C 5.2 03/13/2016   MPG 103 03/13/2016   Lab Results  Component Value Date   PROLACTIN 27.9 (H) 03/13/2016   Lab Results  Component Value Date   CHOL 150 03/13/2016   TRIG 69 03/13/2016   HDL 43 03/13/2016   CHOLHDL 3.5 03/13/2016   VLDL 14 03/13/2016   LDLCALC 93 03/13/2016    Physical Findings: AIMS: Facial and Oral Movements Muscles of Facial Expression: None, normal Lips and Perioral Area: None, normal Jaw: None, normal Tongue: None, normal,Extremity Movements Upper (arms, wrists, hands, fingers): None, normal Lower (legs, knees, ankles, toes): None, normal, Trunk  Movements Neck, shoulders, hips: None, normal, Overall Severity Severity of abnormal movements (highest score from questions above): None, normal Incapacitation due to abnormal movements: None, normal Patient's awareness of abnormal movements (rate only patient's report): No Awareness, Dental Status Current problems with teeth and/or dentures?: No Does patient usually wear dentures?: No  CIWA:    COWS:     Musculoskeletal: Strength & Muscle Tone: within normal limits Gait & Station: unsteady on and off Patient leans: N/A  Psychiatric Specialty Exam: Physical Exam  Nursing note and vitals reviewed.   Review of Systems  Psychiatric/Behavioral: The patient is nervous/anxious.   All other systems reviewed and are negative.   Blood pressure (!) 136/105, pulse 82, temperature 97.8 F (36.6 C), temperature source Oral, resp. rate 20, height 5\' 5"  (1.651 m), weight 92.5 kg (204 lb), SpO2 99 %.Body mass index is 33.95 kg/m.  General Appearance: Fairly Groomed  Eye Contact:  Fair  Speech:  Pressured  Volume: loud at times  varies   Mood:  Anxious and Dysphoric does have calm pleasant periods when she is cheerful, but is seen as crying and labile at other times - but is redirectable when selective staff tries to reassure her   Affect:  Labile and Tearful on and off , calm and pleasant other times   Thought Process:  Linear and Descriptions of Associations: Circumstantial preoccupied  with discharge , getting her SSI and paying her landlord - which seems to be real problems in her life at this time   Orientation:  Full (Time, Place, and Person)  Thought Content:  Delusions, Paranoid Ideation and Rumination , makes comments like I want to be doctor , or barack Fay Records is my cousin - but these are random - is not persistent with the same delusion or grandiose thoughts - keeps changing from time to time  Suicidal Thoughts:  No  Homicidal Thoughts:  No  Memory:  Immediate;   Fair Recent;    Poor Remote;   Poor  Judgement:  Impaired  Insight:  Shallow  Psychomotor Activity:  Restlessness  Concentration:  Concentration: Fair and Attention Span: Fair  Recall:  [employer] of Knowledge:  Poor  Language:  Fair  Akathisia:  No    AIMS (if indicated):     Assets:  Others:  access to healthcare  ADL's:  Intact with support  Cognition:  WNL  Sleep:  Number of Hours: 6 hrs ( as reported by RN in treatment team)      Treatment Plan Summary:Patient continues to have mood swings and delusions - but she is making progress since she sleeps better now, she responds to selective staff on the unit better , is able to have more calm , pleasant period on the unit when she is seen as cheerful , and being pleasant -hence eventhough progress is slow - she seems to be improving. She does have several psychosocial stressors and she is very well aware about them - like having no real social support , having some issues with her SSD, cannot return to her apartment and so on - these are things that she rambles on and on about when she is anxious . Needs constant reassurance and 1:1 sitter on the unit       Schizoaffective disorder, bipolar type (HCC) Unstable  Will continue today 02/16/18plan as below except where it is noted.   Daily contact with patient to assess and evaluate symptoms and progress in treatment and Medication management Reviewed past medical records,treatment plan.   For schizoaffective do: Will continue Zyprexa 25 mg po qhs in divided doses for mood sx/psychosis. Dose adjustment to be done cautiously due to h/o QTC prolongation as well as her inability to walk , drowsinesS. Will repeat EKG tomorrow - Saturday for monitoring qtc prolongation. Will continue cogentin 1 mg po qhs for EPS. Will continue Depakote DR 500 mg po tid for mood sx. Depakote level on 03/18/16- 83 ( therapeutic)  Will continue Neurontin 400 mg po qhs for anxiety/restlessness. Will continue Trilpetal to  300 mg po bid to augment the depakote .  For insomnia: Discontinue Ambien due to lack of efficacy . Discontinue  Lunesta due to lack of efficacy , failed ambien trial , due to qtc prolongation - cannot be on some of the other sleep aids like doxepin, elavil , failed trazodone . Will continue increase dose of Restoril 30 mg po qhs - she has been sleeping better.   For restlessness, ? Akathisia : Added propranolol 10 mg po bid for possible akathisia /restlessness.   For hx of prolonged QTC: Repeat EKG showed qtc improving - will continue to monitor.Repeat EKG - 03/19/16- wnl .  03/23/16 - qt - 416 , qtc - 470 - will continue to monitor.Repeat EKG - 03/25/16- qtc - wnl. Will repeat EKG tomorrow - 03/28/16.  Given PPD test  03/19/16 - for  ALF placement- - negative .  DM: Will continue current medication regimen.  Labs reviewed - Mg - 1.8, K+ - 3.9 - Per IM recommendations in the past - patient's Mg  Needs to be >2 , K+ >4 , hence consulted Hospitalist - who recommended starting patient on Mg oxide 400 mg po daily .' Repeat Mg, BMP on Monday - 03/30/16.  Will continue 1:1 precaution for safety.  Will continue to monitor vitals ,medication compliance and treatment side effects while patient is here.   Will monitor for medical issues as well as call consult as needed.   CSW will continue working on disposition. Patient to be referred to ALF /day program. [facility]/also pending ALF placement if she is more stable.CSW is working on medical records from previous hospital stay - this will give a better idea about what medications worked in the past.  Patient to participate in therapeutic milieu .        Kendra Grissett, MD 03/27/2016, 2:44 PM

## 2016-03-28 DIAGNOSIS — E118 Type 2 diabetes mellitus with unspecified complications: Secondary | ICD-10-CM

## 2016-03-28 LAB — GLUCOSE, CAPILLARY
GLUCOSE-CAPILLARY: 135 mg/dL — AB (ref 65–99)
GLUCOSE-CAPILLARY: 80 mg/dL (ref 65–99)
Glucose-Capillary: 137 mg/dL — ABNORMAL HIGH (ref 65–99)
Glucose-Capillary: 93 mg/dL (ref 65–99)

## 2016-03-28 NOTE — [facility] Group Notes (Signed)
[facility] Group Notes: (Clinical Social Work)   03/28/2016      Type of Therapy:  Group Therapy   Participation Level:  Did Not Attend despite MHT prompting   Keontay Vora Grossman-Orr, LCSW 03/28/2016, 1:10 PM     

## 2016-03-28 NOTE — Progress Notes (Signed)
1:1 note     Pt slept for an hour and a half   She is now awake sitting in the dayroom interacting   She is loud ,hyperverbal with pressured  speech    She makes delusional and grandiose statements about herself and can be defensive frequently    Her gait continues to improve but as her mood fluctuates and she receives medications it tends to change   Pt is on a 1:1 for safety and manic behaviors    Pt is safe at present time

## 2016-03-28 NOTE — Progress Notes (Signed)
[facility]  03/28/2016 11:37 AM Delorse LimberRenitha Ury  MRN:  [id number] Subjective:  Patient reports " I want to go home with my son, I can walk from here."  Objective:Faythe Bland SpanHadley is awake, alert with disorganized thoughts. Patient was placed on 1:1 for safety. Patient continues to be tangential with thought process. Patient reports she is taken her medications and tolerating medications well.Patiet is redirectable at times Reports good appetite and reports she is resting well.  Support, encouragement and reassurance was provided.   Principal Problem: Schizoaffective disorder, bipolar type (HCC) Diagnosis:   Patient Active Problem List   Diagnosis Date Noted  . Electrolyte imbalance [E87.8] 03/27/2016  . Prolonged Q-T interval on ECG [R94.31] 03/13/2016  . Schizoaffective disorder, bipolar type (HCC) [F25.0] 03/12/2016  . Diabetes mellitus (HCC) [E11.9] 03/12/2016   Total Time spent with patient: 30 minutes  Past Psychiatric History:   Past Medical History:  Past Medical History:  Diagnosis Date  . Diabetes mellitus without complication [facility](HCC)     Past Surgical History:  Procedure Laterality Date  . umbical hernia repair     Family History: History reviewed. No pertinent family history. Family Psychiatric  History:  Social History:  History  Alcohol Use No     History  Drug Use No    Social History   Social History  . Marital status: Single    Spouse name: N/A  . Number of children: N/A  . Years of education: N/A   Social History Main Topics  . Smoking status: Current Every Day Smoker    Packs/day: 0.25  . Smokeless tobacco: Never Used  . Alcohol use No  . Drug use: No  . Sexual activity: Not Asked   Other Topics Concern  . None   Social History Narrative  . None   Additional Social History:                         Sleep: Fair  Appetite:  Fair  Current Medications: Current Facility-Administered Medications  Medication Dose Route  Frequency Provider Last Rate Last Dose  . acetaminophen (TYLENOL) tablet 650 mg  650 mg Oral Q6H PRN Kerry HoughSpencer E Simon, PA-C   650 mg at 03/27/16 [id number]  . alum & mag hydroxide-simeth (MAALOX/MYLANTA) 200-200-20 MG/5ML suspension 30 mL  30 mL Oral Q4H PRN Kerry HoughSpencer E Simon, PA-C   30 mL at 03/19/16 0021  . benztropine (COGENTIN) tablet 1 mg  1 mg Oral QHS Jomarie LongsSaramma Eappen, MD   1 mg at 03/27/16 2057  . divalproex (DEPAKOTE) DR tablet 500 mg  500 mg Oral [id number] Jomarie LongsSaramma Eappen, MD   500 mg at 03/28/16 0805  . gabapentin (NEURONTIN) capsule 400 mg  400 mg Oral QHS Saramma Eappen, MD   400 mg at 03/27/16 2057  . hydrocerin (EUCERIN) cream   Topical BID Saramma Eappen, MD      . insulin aspart (novoLOG) injection 0-15 Units  0-15 Units Subcutaneous TID WC Jomarie LongsSaramma Eappen, MD   2 Units at 03/27/16 1149  . insulin aspart (novoLOG) injection 0-5 Units  0-5 Units Subcutaneous QHS Saramma Eappen, MD      . LORazepam (ATIVAN) tablet 1 mg  1 mg Oral Q6H PRN Jomarie LongsSaramma Eappen, MD   1 mg at 03/28/16 0930   Or  . LORazepam (ATIVAN) injection 1 mg  1 mg Intramuscular Q6H PRN Saramma Eappen, MD      . magnesium hydroxide (MILK OF MAGNESIA) suspension 30 mL  30  mL Oral Daily PRN Kerry Hough, PA-C      . magnesium oxide (MAG-OX) tablet 400 mg  400 mg Oral Daily Jomarie Longs, MD   400 mg at 03/28/16 0805  . nicotine polacrilex (NICORETTE) gum 2 mg  2 mg Oral PRN Jomarie Longs, MD      . OLANZapine zydis (ZYPREXA) disintegrating tablet 20 mg  20 mg Oral QHS Jomarie Longs, MD   20 mg at 03/27/16 2057  . OLANZapine zydis (ZYPREXA) disintegrating tablet 5 mg  5 mg Oral Daily Jomarie Longs, MD   5 mg at 03/28/16 0805  . Oxcarbazepine (TRILEPTAL) tablet 300 mg  300 mg Oral BID Jomarie Longs, MD   300 mg at 03/28/16 0805  . propranolol (INDERAL) tablet 10 mg  10 mg Oral BID Jomarie Longs, MD   10 mg at 03/28/16 0805  . temazepam (RESTORIL) capsule 30 mg  30 mg Oral QHS Jomarie Longs, MD   30 mg at 03/27/16 2057     Lab Results:  Results for orders placed or performed during the hospital encounter of 03/11/16 (from the past 48 hour(s))  Glucose, capillary     Status: None   Collection Time: 03/26/16 11:54 AM  Result Value Ref Range   Glucose-Capillary 91 65 - 99 mg/dL  Glucose, capillary     Status: None   Collection Time: 03/26/16  5:34 PM  Result Value Ref Range   Glucose-Capillary 75 65 - 99 mg/dL   Comment 1 Notify RN   Potassium     Status: None   Collection Time: 03/26/16  6:13 PM  Result Value Ref Range   Potassium 3.9 3.5 - 5.1 mmol/L    Comment: Performed at [facility], [address], [place], [place] [address]  Magnesium     Status: None   Collection Time: 03/26/16  6:13 PM  Result Value Ref Range   Magnesium 1.8 1.7 - 2.4 mg/dL    Comment: Performed at [facility], [address] [place], [place] [address]  Glucose, capillary     Status: None   Collection Time: 03/26/16  9:25 PM  Result Value Ref Range   Glucose-Capillary 89 65 - 99 mg/dL  Glucose, capillary     Status: None   Collection Time: 03/27/16  5:41 AM  Result Value Ref Range   Glucose-Capillary 89 65 - 99 mg/dL  Glucose, capillary     Status: Abnormal   Collection Time: 03/27/16 11:44 AM  Result Value Ref Range   Glucose-Capillary 147 (H) 65 - 99 mg/dL   Comment 1 Notify RN    Comment 2 Document in Chart   Glucose, capillary     Status: None   Collection Time: 03/27/16  4:58 PM  Result Value Ref Range   Glucose-Capillary 90 65 - 99 mg/dL  Glucose, capillary     Status: Abnormal   Collection Time: 03/27/16  8:44 PM  Result Value Ref Range   Glucose-Capillary 127 (H) 65 - 99 mg/dL  Glucose, capillary     Status: Abnormal   Collection Time: 03/28/16  6:44 AM  Result Value Ref Range   Glucose-Capillary 137 (H) 65 - 99 mg/dL    Blood Alcohol level:  Lab Results  Component Value Date   ETH <5 03/11/2016   ETH <5 01/05/2016    Metabolic Disorder Labs: Lab  Results  Component Value Date   HGBA1C 5.2 03/13/2016   MPG 103 03/13/2016   Lab Results  Component  Value Date   PROLACTIN 27.9 (H) 03/13/2016   Lab Results  Component Value Date   CHOL 150 03/13/2016   TRIG 69 03/13/2016   HDL 43 03/13/2016   CHOLHDL 3.5 03/13/2016   VLDL 14 03/13/2016   LDLCALC 93 03/13/2016    Physical Findings: AIMS: Facial and Oral Movements Muscles of Facial Expression: None, normal Lips and Perioral Area: None, normal Jaw: None, normal Tongue: None, normal,Extremity Movements Upper (arms, wrists, hands, fingers): None, normal Lower (legs, knees, ankles, toes): None, normal, Trunk Movements Neck, shoulders, hips: None, normal, Overall Severity Severity of abnormal movements (highest score from questions above): None, normal Incapacitation due to abnormal movements: None, normal Patient's awareness of abnormal movements (rate only patient's report): No Awareness, Dental Status Current problems with teeth and/or dentures?: No Does patient usually wear dentures?: No  CIWA:    COWS:     Musculoskeletal: Strength & Muscle Tone: within normal limits Gait & Station: unsteady Patient leans: N/A  Psychiatric Specialty Exam: Physical Exam  Vitals reviewed. Neurological: She is alert.  Psychiatric: She has a normal mood and affect. Her behavior is normal.    Review of Systems  Psychiatric/Behavioral: Positive for hallucinations. The patient is nervous/anxious.     Blood pressure (!) 135/95, pulse 78, temperature 98 F (36.7 C), temperature source Oral, resp. rate 20, height 5\' 5"  (1.651 m), weight 92.5 kg (204 lb), SpO2 99 %.Body mass index is 33.95 kg/m.  General Appearance: Disheveled  Eye Contact:  Minimal  Speech:  Garbled and Pressured  Volume:  Increased with flucuation   Mood:  Dysphoric and Irritable  Affect:  Labile  Thought Process:  Disorganized  Orientation:  Other:  person and place  Thought Content:  Paranoid Ideation, Rumination  and Tangential  Suicidal Thoughts:  No  Homicidal Thoughts:  No  Memory:  Immediate;   Fair Recent;   Poor Remote;   Poor  Judgement:  Impaired  Insight:  Lacking  Psychomotor Activity:  Restlessness  Concentration:  Concentration: Fair  Recall:  [employer] of Knowledge:  Fair  Language:  Poor  Akathisia:  No  Handed:  Right  AIMS (if indicated):     Assets:  Desire for Improvement Social Support Talents/Skills  ADL's:  Intact  Cognition:  WNL  Sleep:  Number of Hours: 3     I agree with current treatment plan on 03/28/2016, Patient seen face-to-face for psychiatric evaluation follow-up, chart reviewed. Reviewed the information documented and agree with the treatment plan.  Treatment Plan Summary: Daily contact with patient to assess and evaluate symptoms and progress in treatment and Medication management   Will continue today 02/17/18plan as below except where it is noted. For schizoaffective do:  Will continue Zyprexa 25 mg po qhs in divided doses for mood sx/psychosis. Dose adjustment to be done cautiously due to h/o QTC prolongation as well as her inability to walk , drowsinesS. Will repeat EKG tomorrow - Saturday for monitoring qtc prolongation.- Pending results  Will continue cogentin 1 mg po qhs for EPS. Will continue Depakote DR 500 mg po tid for mood sx. Depakote level on 03/18/16- 83 ( therapeutic)  Will continue Neurontin 400 mg po qhs for anxiety/restlessness. Will continue Trilpetal to 300 mg po bid to augment the depakote .  For insomnia: Discontinue Ambien due to lack of efficacy . Discontinue  Lunesta due to lack of efficacy , failed ambien trial , due to qtc prolongation - cannot be on some of the other sleep aids  like doxepin, elavil , failed trazodone . Will continue Restoril 30 mg po qhs - she has been sleeping better.   For restlessness/ Akathisia : Added propranolol 10 mg po bid for possible akathisia /restlessness.   For hx of prolonged  QTC: Repeat EKG showed qtc improving - will continue to monitor.Repeat EKG - 03/19/16- wnl .  03/23/16 - qt - 416 , qtc - 470 - will continue to monitor.Repeat EKG - 03/25/16- qtc - wnl. Will repeat EKG tomorrow - 03/28/16.  Given PPD test  03/19/16 - for ALF placement- - negative .  DM: Will continue current medication regimen.  Labs reviewed - Mg - 1.8, K+ - 3.9 - Per IM recommendations in the past - patient's Mg  Needs to be >2 , K+ >4 , hence consulted Hospitalist - who recommended starting patient on Mg oxide 400 mg po daily .' Repeat Mg, BMP on Monday - 03/30/16.  Will continue 1:1 precaution for safety.  Will continue to monitor vitals ,medication compliance and treatment side effects while patient is here.   Will monitor for medical issues as well as call consult as needed.   CSW will continue working on disposition. Patient to be referred to ALF /day program. Pt referred to [facility] pending ALF placement if she is more stable.CSW is working on medical records from previous hospital stay - this will give a better idea about what medications worked in the past.  Patient to participate in therapeutic milieu   Oneta Rack, NP 03/28/2016, 11:37 AM

## 2016-03-28 NOTE — Progress Notes (Signed)
1:1 Note: Patient maintained on constant supervision for safety and fall risk.  Patient in the hallway loud, agitated and yelling at staff due to constant supervision.   Ativan 1 mg given for severe agitation with fair effect.  Patient was able to sleep for few minutes.  Food and fluid intake encouraged.  Diabetic foot care given.  Patient encouraged to sit and rest her feet.   Patient safe with supervision.

## 2016-03-28 NOTE — Progress Notes (Signed)
1:1 Note: Patient maintained on constant supervision for safety and fall risk.  Patient is alert and in the dayroom.  Patient is interacting with staff and watching TV.  Patient is hyper verbal but redirectable.  Medication given as prescribed.  Routine safety checks continues.  Patient is safe with supervision.

## 2016-03-28 NOTE — Progress Notes (Signed)
Pt had an episode of paranoia and was accusing the MHT of plotting against her   Pt kept saying look at the tape  Look at the tape   Pt was labile irritable and speaking aggressive and loudly to the mht    Pt received ativan and deescelated pt with staff besides the sitter   Pt gradually became calm as the medication got into her system   She now reports the MHT is ok and is not doing anything to her    Pt mood are going from elated and euphoric to agitated and paranoid    She is on a 1:1 for safety and behaviour    Pt is safe at present time

## 2016-03-28 NOTE — Progress Notes (Signed)
1:1 note   Pt slept for a few hours but has been up since 3 am    She has been fluctuating between being agitated and being euphoric    Having flight of ideas and disorganized thinking  She remains grandiose and hyperverbal with pressured speech   At times she gets suspicious of her sitter but another staff member can spend a little time talking with her and she forgets the agitation and suspicion and will continue with the same sitter   Her speech is loud and rambling   Her gait is mostly steady but she can be impulsive and has been using a walker at times but trys to walk too fast with it    Pt is on a 1:1 for safety and behavior   She is presently safe

## 2016-03-28 NOTE — Progress Notes (Signed)
Did not attend group 

## 2016-03-28 NOTE — Progress Notes (Signed)
1:1 Note: Patient maintained on constant supervision for safety and fall risk.  Patient is agitated, loud and threatening to have staff fired.  Patient pacing the hallway restlessly.  Patient difficult to redirect at times.  Patient preoccupied with going home to pay her rent.  Routine safety checks continues.  Medication given as prescribed.  Patient safe with supervision.

## 2016-03-29 LAB — GLUCOSE, CAPILLARY
GLUCOSE-CAPILLARY: 104 mg/dL — AB (ref 65–99)
Glucose-Capillary: 97 mg/dL (ref 65–99)
Glucose-Capillary: 93 mg/dL (ref 65–99)
Glucose-Capillary: 114 mg/dL — ABNORMAL HIGH (ref 65–99)

## 2016-03-29 MED ORDER — LORAZEPAM 2 MG/ML IJ SOLN
INTRAMUSCULAR | Status: AC
  Filled 2016-03-29: qty 1

## 2016-03-29 MED ORDER — HALOPERIDOL 5 MG PO TABS
5.0000 mg | ORAL_TABLET | Freq: Once | ORAL | Status: AC
  Filled 2016-03-29: qty 1

## 2016-03-29 MED ORDER — HALOPERIDOL LACTATE 5 MG/ML IJ SOLN
5.0000 mg | Freq: Once | INTRAMUSCULAR | Status: AC
Start: 2016-03-29 — End: 2016-03-29
  Administered 2016-03-29: 5 mg via INTRAMUSCULAR
  Filled 2016-03-29: qty 1

## 2016-03-29 MED ORDER — HALOPERIDOL LACTATE 5 MG/ML IJ SOLN
INTRAMUSCULAR | Status: AC
  Filled 2016-03-29: qty 1

## 2016-03-29 MED ORDER — LORAZEPAM 2 MG/ML IJ SOLN
2.0000 mg | Freq: Once | INTRAMUSCULAR | Status: AC
  Administered 2016-03-29: 2 mg via INTRAMUSCULAR

## 2016-03-29 NOTE — Progress Notes (Signed)
[facility] MD Progress Note  03/29/2016 11:19 AM Anyeli Hockenbury  MRN:  [id number] Subjective:  Patient continue to present with pressured and tangential speech   Objective:Seena Suh is awake, alert with disorganized thoughts. Patient is irritable and agitated this morning.  Patient will continue with 1:1 for safety/ high fall risk. Patient continues to be tangential with thought process. Patient reports she is taken her medications and tolerating medications well, denies headaches, nausea or vomiting.Patiet is redirectable at times. Per staffing notes patient has a good appetite and reports she is resting well throughout the day and night.  Support, encouragement and reassurance was provided.   Principal Problem: Schizoaffective disorder, bipolar type (HCC) Diagnosis:   Patient Active Problem List   Diagnosis Date Noted  . Electrolyte imbalance [E87.8] 03/27/2016  . Prolonged Q-T interval on ECG [R94.31] 03/13/2016  . Schizoaffective disorder, bipolar type (HCC) [F25.0] 03/12/2016  . Diabetes mellitus (HCC) [E11.9] 03/12/2016   Total Time spent with patient: 30 minutes  Past Psychiatric History:   Past Medical History:  Past Medical History:  Diagnosis Date  . Diabetes mellitus without complication [facility])     Past Surgical History:  Procedure Laterality Date  . umbical hernia repair     Family History: History reviewed. No pertinent family history. Family Psychiatric  History:  Social History:  History  Alcohol Use No     History  Drug Use No    Social History   Social History  . Marital status: Single    Spouse name: N/A  . Number of children: N/A  . Years of education: N/A   Social History Main Topics  . Smoking status: Current Every Day Smoker    Packs/day: 0.25  . Smokeless tobacco: Never Used  . Alcohol use No  . Drug use: No  . Sexual activity: Not Asked   Other Topics Concern  . None   Social History Narrative  . None   Additional Social History:                          Sleep: Fair  Appetite:  Fair  Current Medications: Current Facility-Administered Medications  Medication Dose Route Frequency Provider Last Rate Last Dose  . acetaminophen (TYLENOL) tablet 650 mg  650 mg Oral Q6H PRN Kerry Hough, PA-C   650 mg at 03/27/16 4098  . alum & mag hydroxide-simeth (MAALOX/MYLANTA) 200-200-20 MG/5ML suspension 30 mL  30 mL Oral Q4H PRN Kerry Hough, PA-C   30 mL at 03/19/16 0021  . benztropine (COGENTIN) tablet 1 mg  1 mg Oral QHS Jomarie Longs, MD   1 mg at 03/28/16 2239  . divalproex (DEPAKOTE) DR tablet 500 mg  500 mg Oral [id number] Jomarie Longs, MD   500 mg at 03/29/16 0929  . gabapentin (NEURONTIN) capsule 400 mg  400 mg Oral QHS Saramma Eappen, MD   400 mg at 03/28/16 2239  . hydrocerin (EUCERIN) cream   Topical BID Saramma Eappen, MD      . insulin aspart (novoLOG) injection 0-15 Units  0-15 Units Subcutaneous TID WC Jomarie Longs, MD   2 Units at 03/28/16 1716  . insulin aspart (novoLOG) injection 0-5 Units  0-5 Units Subcutaneous QHS Saramma Eappen, MD      . LORazepam (ATIVAN) tablet 1 mg  1 mg Oral Q6H PRN Jomarie Longs, MD   1 mg at 03/29/16 0929   Or  . LORazepam (ATIVAN) injection 1 mg  1 mg Intramuscular Q6H  PRN Jomarie Longs, MD      . magnesium hydroxide (MILK OF MAGNESIA) suspension 30 mL  30 mL Oral Daily PRN Kerry Hough, PA-C      . magnesium oxide (MAG-OX) tablet 400 mg  400 mg Oral Daily Saramma Eappen, MD   400 mg at 03/29/16 0929  . nicotine polacrilex (NICORETTE) gum 2 mg  2 mg Oral PRN Jomarie Longs, MD      . OLANZapine zydis (ZYPREXA) disintegrating tablet 20 mg  20 mg Oral QHS Jomarie Longs, MD   20 mg at 03/28/16 2238  . OLANZapine zydis (ZYPREXA) disintegrating tablet 5 mg  5 mg Oral Daily Jomarie Longs, MD   5 mg at 03/29/16 0929  . Oxcarbazepine (TRILEPTAL) tablet 300 mg  300 mg Oral BID Jomarie Longs, MD   300 mg at 03/29/16 0929  . propranolol (INDERAL) tablet 10 mg  10 mg Oral  BID Jomarie Longs, MD   10 mg at 03/29/16 0930  . temazepam (RESTORIL) capsule 30 mg  30 mg Oral QHS Jomarie Longs, MD   30 mg at 03/28/16 2238    Lab Results:  Results for orders placed or performed during the hospital encounter of 03/11/16 (from the past 48 hour(s))  Glucose, capillary     Status: Abnormal   Collection Time: 03/27/16 11:44 AM  Result Value Ref Range   Glucose-Capillary 147 (H) 65 - 99 mg/dL   Comment 1 Notify RN    Comment 2 Document in Chart   Glucose, capillary     Status: None   Collection Time: 03/27/16  4:58 PM  Result Value Ref Range   Glucose-Capillary 90 65 - 99 mg/dL  Glucose, capillary     Status: Abnormal   Collection Time: 03/27/16  8:44 PM  Result Value Ref Range   Glucose-Capillary 127 (H) 65 - 99 mg/dL  Glucose, capillary     Status: Abnormal   Collection Time: 03/28/16  6:44 AM  Result Value Ref Range   Glucose-Capillary 137 (H) 65 - 99 mg/dL  Glucose, capillary     Status: None   Collection Time: 03/28/16  1:25 PM  Result Value Ref Range   Glucose-Capillary 93 65 - 99 mg/dL  Glucose, capillary     Status: Abnormal   Collection Time: 03/28/16  4:46 PM  Result Value Ref Range   Glucose-Capillary 135 (H) 65 - 99 mg/dL  Glucose, capillary     Status: None   Collection Time: 03/28/16  9:15 PM  Result Value Ref Range   Glucose-Capillary 80 65 - 99 mg/dL  Glucose, capillary     Status: None   Collection Time: 03/29/16  6:43 AM  Result Value Ref Range   Glucose-Capillary 97 65 - 99 mg/dL    Blood Alcohol level:  Lab Results  Component Value Date   ETH <5 03/11/2016   ETH <5 01/05/2016    Metabolic Disorder Labs: Lab Results  Component Value Date   HGBA1C 5.2 03/13/2016   MPG 103 03/13/2016   Lab Results  Component Value Date   PROLACTIN 27.9 (H) 03/13/2016   Lab Results  Component Value Date   CHOL 150 03/13/2016   TRIG 69 03/13/2016   HDL 43 03/13/2016   CHOLHDL 3.5 03/13/2016   VLDL 14 03/13/2016   LDLCALC 93 03/13/2016     Physical Findings: AIMS: Facial and Oral Movements Muscles of Facial Expression: None, normal Lips and Perioral Area: None, normal Jaw: None, normal Tongue: None, normal,Extremity Movements Upper (arms,  wrists, hands, fingers): None, normal Lower (legs, knees, ankles, toes): None, normal, Trunk Movements Neck, shoulders, hips: None, normal, Overall Severity Severity of abnormal movements (highest score from questions above): None, normal Incapacitation due to abnormal movements: None, normal Patient's awareness of abnormal movements (rate only patient's report): No Awareness, Dental Status Current problems with teeth and/or dentures?: No Does patient usually wear dentures?: No  CIWA:    COWS:     Musculoskeletal: Strength & Muscle Tone: within normal limits Gait & Station: unsteady Patient leans: N/A  Psychiatric Specialty Exam: Physical Exam  Vitals reviewed. Constitutional: She appears well-developed.  Neurological: She is alert.  Psychiatric: She has a normal mood and affect. Her behavior is normal.    Review of Systems  Psychiatric/Behavioral: Positive for depression. The patient is nervous/anxious and has insomnia.     Blood pressure (!) 145/69, pulse 78, temperature 98 F (36.7 C), temperature source Oral, resp. rate 20, height 5\' 5"  (1.651 m), weight 92.5 kg (204 lb), SpO2 99 %.Body mass index is 33.95 kg/m.  General Appearance: Disheveled and Guarded  Eye Contact:  Minimal  Speech:  Garbled and Pressured  Volume:  Increased with fluctuation   Mood:  Dysphoric and Irritable  Affect:  Labile  Thought Process:  Disorganized  Orientation:  Other:  person and place  Thought Content:  Paranoid Ideation, Rumination and Tangential  Suicidal Thoughts:  No  Homicidal Thoughts:  No  Memory:  Immediate;   Fair Recent;   Poor Remote;   Poor  Judgement:  Impaired  Insight:  Lacking  Psychomotor Activity:  Restlessness  Concentration:  Concentration: Fair  Recall:   [employer]iservFair  Fund of Knowledge:  Fair  Language:  Poor  Akathisia:  No  Handed:  Right  AIMS (if indicated):     Assets:  Desire for Improvement Social Support Talents/Skills  ADL's:  Intact  Cognition:  WNL  Sleep:  Number of Hours: 6     I agree with current treatment plan on 03/29/2016, Patient seen face-to-face for psychiatric evaluation follow-up, chart reviewed. Reviewed the information documented and agree with the treatment plan.  Treatment Plan Summary: Daily contact with patient to assess and evaluate symptoms and progress in treatment and Medication management   Will continue today 02/18/18plan as below except where it is noted. For schizoaffective do:  Will continue Zyprexa 25 mg po qhs in divided doses for mood sx/psychosis. Dose adjustment to be done cautiously due to h/o QTC prolongation as well as her inability to walk , drowsinesS. Will repeat EKG tomorrow - Saturday for monitoring qtc prolongation.- Reviewed Qt/Qtc interval is trending down. 386/440  Will continue cogentin 1 mg po qhs for EPS. Will continue Depakote DR 500 mg po tid for mood sx. Depakote level on 03/18/16- 83 ( therapeutic)  Will continue Neurontin 400 mg po qhs for anxiety/restlessness. Will continue Trilpetal to 300 mg po bid to augment the depakote .  For insomnia: Discontinue Ambien due to lack of efficacy . Discontinue  Lunesta due to lack of efficacy , failed ambien trial , due to qtc prolongation - cannot be on some of the other sleep aids like doxepin, elavil , failed trazodone . Will continue Restoril 30 mg po qhs - she has been sleeping better.   For restlessness/ Akathisia : Added propranolol 10 mg po bid for possible akathisia /restlessness.   For hx of prolonged QTC: Repeat EKG showed qtc improving - will continue to monitor.Repeat EKG - 03/19/16- wnl .  03/23/16 - qt -  416 , qtc - 470 - will continue to monitor.Repeat EKG - 03/25/16- qtc - wnl. Will repeat EKG tomorrow -  03/28/16.  Given PPD test  03/19/16 - for ALF placement- - negative .  DM: Will continue current medication regimen.  Labs reviewed - Mg - 1.8, K+ - 3.9 - Per IM recommendations in the past - patient's Mg  Needs to be >2 , K+ >4 , hence consulted Hospitalist - who recommended starting patient on Mg oxide 400 mg po daily .' Repeat Mg, BMP on Monday - 03/30/16.  Will continue 1:1 precaution for safety.  Will continue to monitor vitals ,medication compliance and treatment side effects while patient is here.   Will monitor for medical issues as well as call consult as needed.   CSW will continue working on disposition. Patient to be referred to ALF /day program. Pt referred to [facility] pending ALF placement if she is more stable.CSW is working on medical records from previous hospital stay - this will give a better idea about what medications worked in the past.  Patient to participate in therapeutic milieu   Oneta Rack, NP 03/29/2016, 11:19 AM I agree with findings and treatment plan of this patient

## 2016-03-29 NOTE — Progress Notes (Signed)
1:1 note    Pt continues to be psychotic and delusional   She said she was going to threaten to kill somebody who did her wrong and she would do it with her body because she was lethal weapon   She woke up abruptly and ran a staff member out of her room calling her a whore and a hypocrite   Then she just laid back down and went back to sleep   Her speech is loud and pressured   She took her medications with encouragement and the reward of a drink that she likes     her thinking is disoragnized and delusional , grandiose and paranoid   Making frequent threatening statements about hurting people who do her wrong or have done her wrong in the past   Pt has become more unsteady and has started using the walker for a while   But she is just as apt to put the walker aside and walk ok  without it    Pt on a 1:1 for safety and need for constant redirection due to manic and psychotic behaviors   She remains safe at present
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
## 2016-03-29 NOTE — Progress Notes (Signed)
Patient ID: Catherine LimberRenitha Bender, female   DOB: 06/07/1970, 46 y.o.   MRN: [id number]     Nursing 1:1 Note  D: Pt continues to be irritable and agitated on the unit. She continues to be loud and belligerent, and not responding to verbal direction. Pt continues to disrupt the unit. She reported that her depression was a 0, her hopelessness was a 0, and her anxiety was a 0. Pt reports being negative SI/HI, no AH/VH noted. Pt reported that her goal for today was to sign up for college. A: 1:1 continued for patient safety. R: Pts safety maintained.

## 2016-03-29 NOTE — Progress Notes (Signed)
Patient ID: Catherine Bender, female   DOB: 04/07/1970, 46 y.o.   MRN: [id number]  Nursing 1:1 Note  D: Pt continues to be irritable and agitated on the unit. She continues to be loud and belligerent, and not responding to verbal direction. Pt continues to disrupt the unit. Staff remains with her to keep her safe, patient has no insight.  A: 1:1 continued for patient safety. R: Pts safety maintained.

## 2016-03-29 NOTE — Progress Notes (Signed)
1:1 note    Pt slept the first part of the shift but is presently in the hall making delusional statements and and is loud and agitated   She has pressured speech and racing disorganized thoughts   She goes from being calm to being very agitated  And when she is agitated she is difficult to redirect   Pt is on a 1:1 for manic and psychotic behaviors and safety  Pt is presently safe

## 2016-03-29 NOTE — [facility] Group Notes (Signed)
[facility] Group Notes: (Clinical Social Work)   03/29/2016      Type of Therapy:  Group Therapy   Participation Level:  Did Not Attend despite MHT prompting   Ambrose MantleMareida Grossman-Orr, LCSW 03/29/2016, 12:44 PM

## 2016-03-29 NOTE — Progress Notes (Signed)
1:1 note   Pt remains psychotic and delusional    She was incontinent during the night and was assisted to a shower and a bed change    Pt went back to sleep   She was able to sleep about 6 hours and is less agitated this morning   She is grandiose and intrusive but more redirectable   Pt is on a 1:1 for safety   Safety maintained

## 2016-03-29 NOTE — Progress Notes (Signed)
Adult Psychoeducational Group Note  Date:  03/29/2016 Time:  8:59 PM  Group Topic/Focus:  Wrap-Up Group:   The focus of this group is to help patients review their daily goal of treatment and discuss progress on daily workbooks.  Participation Level:  Did Not Attend  Additional Comments:  Pt was invited to attend, however pt was in bed.  Jamonte Curfman C 03/29/2016, 8:59 PM 

## 2016-03-30 LAB — BASIC METABOLIC PANEL
ANION GAP: 7 (ref 5–15)
BUN: 19 mg/dL (ref 6–20)
CALCIUM: 9 mg/dL (ref 8.9–10.3)
CO2: 30 mmol/L (ref 22–32)
CREATININE: 0.75 mg/dL (ref 0.44–1.00)
Chloride: 102 mmol/L (ref 101–111)
GFR calc non Af Amer: >60 mL/min (ref >60)
Glucose, Bld: 149 mg/dL — ABNORMAL HIGH (ref 65–99)
Potassium: 4.4 mmol/L (ref 3.5–5.1)
SODIUM: 139 mmol/L (ref 135–145)

## 2016-03-30 LAB — GLUCOSE, CAPILLARY
GLUCOSE-CAPILLARY: 92 mg/dL (ref 65–99)
Glucose-Capillary: 93 mg/dL (ref 65–99)
Glucose-Capillary: 137 mg/dL — ABNORMAL HIGH (ref 65–99)
Glucose-Capillary: 126 mg/dL — ABNORMAL HIGH (ref 65–99)

## 2016-03-30 LAB — MAGNESIUM: MAGNESIUM: 1.8 mg/dL (ref 1.7–2.4)

## 2016-03-30 MED ORDER — LITHIUM CARBONATE 150 MG PO CAPS
150.0000 mg | ORAL_CAPSULE | Freq: Two times a day (BID) | ORAL | Status: DC
  Administered 2016-03-30 – 2016-04-07 (×16): 150 mg via ORAL
  Filled 2016-03-30 (×20): qty 1

## 2016-03-30 MED ORDER — OXCARBAZEPINE 150 MG PO TABS
150.0000 mg | ORAL_TABLET | Freq: Two times a day (BID) | ORAL | Status: DC
  Administered 2016-03-30 – 2016-04-08 (×18): 150 mg via ORAL
  Filled 2016-03-30 (×22): qty 1

## 2016-03-30 NOTE — Progress Notes (Signed)
Pt remains sleeping at intervals and delusional confused behaviors and thinking in between   She can be agitated and threatening but with redirection and encouragement she calms down   Pt on a 1 ;1 for safety and behavioral issues   She remains safe

## 2016-03-30 NOTE — [facility] Group Notes (Signed)
[facility] LCSW Group Therapy  03/30/2016 1:15 pm  Type of Therapy: Process Group Therapy  Participation Level:  Active  Participation Quality:  Appropriate  Affect:  Flat  Cognitive:  Oriented  Insight:  Improving  Engagement in Group:  Limited  Engagement in Therapy:  Limited  Modes of Intervention:  Activity, Clarification, Education, Problem-solving and Support  Summary of Progress/Problems: Today's group addressed the issue of overcoming obstacles.  Patients were asked to identify their biggest obstacle post d/c that stands in the way of their on-going success, and then problem solve as to how to manage this. Came briefly.  Not as disruptive as in the past.  Able to sit quietly without interrupting.  Fell asleep, and ended up leaving and not returning.  [name], [name] B 03/30/2016   4:18 PM
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
## 2016-03-30 NOTE — Progress Notes (Signed)
D: Pt observed yelling, verbally abusive towards staff, demanding to be d/c earlier this shift. Became very irritable when her demands were not met. Verbal redirections were ineffective at the time. A: PRN Ativan given for outburst and was effective. Emotional support and availability provided to pt. 1:1 observation and fall precaution continues as ordered.  R: Pt Tolerates all PO intake well. Calm at this time. POC continues for safety and mood stability.
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
## 2016-03-30 NOTE — Progress Notes (Signed)
1:1 note   Pt came to the nurses station demanding her phone be charged because she was going to work and needed to leave now   She continues to be demanding, intrusive, voicing delusional grandiose  beliefs   She has pressured speech and racing disorganized thoughts    Pt on 1:1 for safety and manic and psychotic behaviors  She needs constant redirection   She is presently safe

## 2016-03-30 NOTE — Progress Notes (Signed)
Recreation Therapy Notes  Date: 03/30/16 Time: 1000 Location: 500 Hall Dayroom  Group Topic: Wellness  Goal Area(s) Addresses:  Patient will define components of whole wellness. Patient will verbalize benefit of whole wellness.  Intervention: Chair exercises, meditation  Activity: Chair Exercises, Meditation.  LRT introduced chair exercises as a way to get in some physical wellness and meditation as a way to exercise mental wellness.  LRT read script to allow patients to engage in both techniques. Patients were to follow along as LRT read from scripts to engage in both activities.  Education: Wellness, Building control surveyorDischarge Planning.   Education Outcome: Acknowledges education/In group clarification offered/Needs additional education.   Clinical Observations/Feedback: Pt did not attend group.   Caroll RancherMarjette Briyana Badman, LRT/CTRS         Caroll RancherLindsay, Kellee Sittner A 03/30/2016 12:46 PM

## 2016-03-30 NOTE — Progress Notes (Signed)
[facility] MD Progress Note  03/30/2016 12:18 PM Elley Harp  MRN:  [id number] Subjective:Pt states " My kid is in the system , I am worried about that."   Objective:Sameerah Stang is a 77 y old AAF, who has schizoaffective do , who presented with worsening mood sx /psychosis, noncompliant on medications. Patient seen and chart reviewed.Discussed patient with treatment team.  Pt today seen in her room with 1;1 sitter for safety. Pt was ruminative about a news she received from her mother about her grand kid ,there has been DSS involvement. Pt seen as tearful, sad, about this , seems to be going back and forth , tangential about her wanting to live by herself , wanting her address to stay the same , does not want any changes made with her mailing address and so on. Pt continues to have pressured speech often and is seen as labile often on the unit - responds to redirection. She does respond to selective MHTs and selective staff on the unit. Pt continues to make delusional statements on and off , but delusions are not persistent , changes daily and she is not preoccupied with it. Pt was sleeping past couple of nights better , but last night was restless - will continue to monitor. Pt continues to need a lot of encouragement and support.    Principal Problem: Schizoaffective disorder, bipolar type ([place]) Diagnosis:   Patient Active Problem List   Diagnosis Date Noted  . Electrolyte imbalance [E87.8] 03/27/2016  . Prolonged Q-T interval on ECG [R94.31] 03/13/2016  . Schizoaffective disorder, bipolar type ([place]) [F25.0] 03/12/2016  . Diabetes mellitus ([place]) [E11.9] 03/12/2016   Total Time spent with patient: 30 minutes  Past Psychiatric History: Please see H&P.   Past Medical History:  Past Medical History:  Diagnosis Date  . Diabetes mellitus without complication [facility])     Past Surgical History:  Procedure Laterality Date  . umbical hernia repair     Family History: Please see  H&P.  Family Psychiatric  History: Please see H&P.  Social History: Please see H&P.  History  Alcohol Use No     History  Drug Use No    Social History   Social History  . Marital status: Single    Spouse name: N/A  . Number of children: N/A  . Years of education: N/A   Social History Main Topics  . Smoking status: Current Every Day Smoker    Packs/day: 0.25  . Smokeless tobacco: Never Used  . Alcohol use No  . Drug use: No  . Sexual activity: Not Asked   Other Topics Concern  . None   Social History Narrative  . None   Additional Social History:                         Sleep: restless  Appetite:  Fair  Current Medications: Current Facility-Administered Medications  Medication Dose Route Frequency Provider Last Rate Last Dose  . acetaminophen (TYLENOL) tablet 650 mg  650 mg Oral Q6H PRN Laverle Hobby, PA-C   650 mg at 03/29/16 3491  . alum & mag hydroxide-simeth (MAALOX/MYLANTA) 200-200-20 MG/5ML suspension 30 mL  30 mL Oral Q4H PRN Laverle Hobby, PA-C   30 mL at 03/19/16 0021  . benztropine (COGENTIN) tablet 1 mg  1 mg Oral QHS Ameilia Rattan, MD   1 mg at 03/29/16 2200  . divalproex (DEPAKOTE) DR tablet 500 mg  500 mg Oral [id number] Addalee Kavanagh,  MD   500 mg at 03/30/16 0754  . gabapentin (NEURONTIN) capsule 400 mg  400 mg Oral QHS Brittish Bolinger, MD   400 mg at 03/29/16 2200  . hydrocerin (EUCERIN) cream   Topical BID Berlie Persky, MD      . insulin aspart (novoLOG) injection 0-15 Units  0-15 Units Subcutaneous TID WC Ursula Alert, MD   2 Units at 03/28/16 1716  . insulin aspart (novoLOG) injection 0-5 Units  0-5 Units Subcutaneous QHS Treyon Wymore, MD      . lithium carbonate capsule 150 mg  150 mg Oral BID WC Daleisa Halperin, MD      . LORazepam (ATIVAN) tablet 1 mg  1 mg Oral Q6H PRN Ursula Alert, MD   1 mg at 03/30/16 1106   Or  . LORazepam (ATIVAN) injection 1 mg  1 mg Intramuscular Q6H PRN Ursula Alert, MD      . magnesium  hydroxide (MILK OF MAGNESIA) suspension 30 mL  30 mL Oral Daily PRN Laverle Hobby, PA-C      . magnesium oxide (MAG-OX) tablet 400 mg  400 mg Oral Daily Kentrell Hallahan, MD   400 mg at 03/30/16 0754  . nicotine polacrilex (NICORETTE) gum 2 mg  2 mg Oral PRN Ursula Alert, MD      . OLANZapine zydis (ZYPREXA) disintegrating tablet 20 mg  20 mg Oral QHS Tayden Duran, MD   20 mg at 03/29/16 2200  . OLANZapine zydis (ZYPREXA) disintegrating tablet 5 mg  5 mg Oral Daily Ursula Alert, MD   5 mg at 03/30/16 0753  . OXcarbazepine (TRILEPTAL) tablet 150 mg  150 mg Oral BID Ursula Alert, MD      . propranolol (INDERAL) tablet 10 mg  10 mg Oral BID Ursula Alert, MD   10 mg at 03/30/16 0754  . temazepam (RESTORIL) capsule 30 mg  30 mg Oral QHS Ursula Alert, MD   30 mg at 03/29/16 2200    Lab Results:  Results for orders placed or performed during the hospital encounter of 03/11/16 (from the past 48 hour(s))  Glucose, capillary     Status: None   Collection Time: 03/28/16  1:25 PM  Result Value Ref Range   Glucose-Capillary 93 65 - 99 mg/dL  Glucose, capillary     Status: Abnormal   Collection Time: 03/28/16  4:46 PM  Result Value Ref Range   Glucose-Capillary 135 (H) 65 - 99 mg/dL  Glucose, capillary     Status: None   Collection Time: 03/28/16  9:15 PM  Result Value Ref Range   Glucose-Capillary 80 65 - 99 mg/dL  Glucose, capillary     Status: None   Collection Time: 03/29/16  6:43 AM  Result Value Ref Range   Glucose-Capillary 97 65 - 99 mg/dL  Glucose, capillary     Status: None   Collection Time: 03/29/16 11:51 AM  Result Value Ref Range   Glucose-Capillary 93 65 - 99 mg/dL  Glucose, capillary     Status: Abnormal   Collection Time: 03/29/16  3:58 PM  Result Value Ref Range   Glucose-Capillary 114 (H) 65 - 99 mg/dL  Glucose, capillary     Status: Abnormal   Collection Time: 03/29/16 10:00 PM  Result Value Ref Range   Glucose-Capillary 104 (H) 65 - 99 mg/dL   Comment 1  Notify RN   Glucose, capillary     Status: None   Collection Time: 03/30/16  5:39 AM  Result Value Ref Range  Glucose-Capillary 92 65 - 99 mg/dL  Basic metabolic panel     Status: Abnormal   Collection Time: 03/30/16  6:15 AM  Result Value Ref Range   Sodium 139 135 - 145 mmol/L   Potassium 4.4 3.5 - 5.1 mmol/L   Chloride 102 101 - 111 mmol/L   CO2 30 22 - 32 mmol/L   Glucose, Bld 149 (H) 65 - 99 mg/dL   BUN 19 6 - 20 mg/dL   Creatinine, Ser 0.75 0.44 - 1.00 mg/dL   Calcium 9.0 8.9 - 10.3 mg/dL   GFR calc non Af Amer >60 >60 mL/min   GFR calc Af Amer >60 >60 mL/min    Comment: (NOTE) The eGFR has been calculated using the CKD EPI equation. This calculation has not been validated in all clinical situations. eGFR's persistently <60 mL/min signify possible Chronic Kidney Disease.    Anion gap 7 5 - 15    Comment: Performed at [facility], [place] [address], [place], [place] [address]  Magnesium     Status: None   Collection Time: 03/30/16  6:15 AM  Result Value Ref Range   Magnesium 1.8 1.7 - 2.4 mg/dL    Comment: Performed at [facility], [place] [address] [place], [place] [address]  Glucose, capillary     Status: None   Collection Time: 03/30/16 12:04 PM  Result Value Ref Range   Glucose-Capillary 93 65 - 99 mg/dL    Blood Alcohol level:  Lab Results  Component Value Date   ETH <5 03/11/2016   ETH <5 92/12/9415    Metabolic Disorder Labs: Lab Results  Component Value Date   HGBA1C 5.2 03/13/2016   MPG 103 03/13/2016   Lab Results  Component Value Date   PROLACTIN 27.9 (H) 03/13/2016   Lab Results  Component Value Date   CHOL 150 03/13/2016   TRIG 69 03/13/2016   HDL 43 03/13/2016   CHOLHDL 3.5 03/13/2016   VLDL 14 03/13/2016   LDLCALC 93 03/13/2016    Physical Findings: AIMS: Facial and Oral Movements Muscles of Facial Expression: None, normal Lips and Perioral Area: None, normal Jaw: None, normal Tongue: None,  normal,Extremity Movements Upper (arms, wrists, hands, fingers): None, normal Lower (legs, knees, ankles, toes): None, normal, Trunk Movements Neck, shoulders, hips: None, normal, Overall Severity Severity of abnormal movements (highest score from questions above): None, normal Incapacitation due to abnormal movements: None, normal Patient's awareness of abnormal movements (rate only patient's report): No Awareness, Dental Status Current problems with teeth and/or dentures?: No Does patient usually wear dentures?: No  CIWA:    COWS:     Musculoskeletal: Strength & Muscle Tone: within normal limits Gait & Station: more steady Patient leans: N/A  Psychiatric Specialty Exam: Physical Exam  Nursing note and vitals reviewed.   Review of Systems  Psychiatric/Behavioral: Positive for depression. The patient is nervous/anxious.   All other systems reviewed and are negative.   Blood pressure 129/81, pulse 93, temperature 98.6 F (37 C), temperature source Oral, resp. rate 20, height _0  (1.651 m), weight 92.5 kg (204 lb), SpO2 99 %.Body mass index is 33.95 kg/m.  General Appearance: Fairly Groomed and Guarded  Eye Contact:  Fair  Speech:  Pressured  Volume:  varies - up and down with fluctuation   Mood:  Depressed  Affect:  Labile and Tearful  Thought Process:  Irrelevant and Descriptions of Associations: Tangential - more linear   Orientation:  Full (Time, Place, and Person)  Thought Content:  Paranoid Ideation and Rumination, ruminative about what happened to her grand kid  Suicidal Thoughts:  No  Homicidal Thoughts:  No  Memory:  Immediate;   Fair Recent;   Fair Remote;   Poor  Judgement:  Impaired  Insight:  Lacking  Psychomotor Activity:  Restlessness  Concentration:  Concentration: Fair and Attention Span: Fair  Recall:  [employer] of Knowledge:  Fair  Language:  Fair  Akathisia:  No  Handed:  Right  AIMS (if indicated):     Assets:  Desire for  Improvement Talents/Skills  ADL's:  Intact  Cognition:  WNL  Sleep:  Number of Hours: 3    Will continue today 03/30/16  plan as below except where it is noted.  Treatment Plan Summary:Patient with schizoaffective do , continues to be unstable, labile often , needs constant 1;1 precaution for being unsteady and also for being intrusive , her mood is still labile , although improving , continues to be ruminative , delusional.   Daily contact with patient to assess and evaluate symptoms and progress in treatment and Medication management   For schizoaffective NH:[id number] - will continue Zyprexa, depakote , neurontin  as noted below - will not make any changes today.  Will continue Zyprexa 25 mg po qhs in divided doses for mood sx/psychosis. Dose adjustment to be done cautiously due to h/o QTC prolongation as well as her inability to walk , drowsinesS. Will repeat EKG tomorrow - Saturday for monitoring qtc prolongation.- Reviewed Qt/Qtc interval is trending down. 386/440  Will continue cogentin 1 mg po qhs for EPS. Will continue Depakote DR 500 mg po tid for mood sx. Depakote level on 03/18/16- 83 ( therapeutic)  Will continue Neurontin 400 mg po qhs for anxiety/restlessness. Will reduce Trilpetal to 150 mg po bid for mood sx. Will add Li 150 mg po bid to augment depakote for mood lability , if she responds well , will discontinue Trilpetal. Li level in 5 days since it may get toxic at higher levels. Will continue to monitor EKG due to QTC prolongation hx.  For insomnia:Reviewed - continue restoril as noted below. Discontinue Ambien due to lack of efficacy . Discontinue  Lunesta due to lack of efficacy , failed ambien trial , due to qtc prolongation - cannot be on some of the other sleep aids like doxepin, elavil , failed trazodone . Will continue Restoril 30 mg po qhs - she has been sleeping better.   For restlessness/ Akathisia : Added propranolol 10 mg po bid for possible akathisia  /restlessness.   For hx of prolonged QTC: Repeat EKG showed qtc improving - will continue to monitor.Repeat EKG - 03/19/16- wnl .  03/23/16 - qt - 416 , qtc - 470 - will continue to monitor.Repeat EKG - 03/25/16- qtc - wnl. Repeat EKG tomorrow for h/o qtc prolongation.  Given PPD test  03/19/16 - for ALF placement- - negative .  DM: Will continue current medication regimen.  Labs reviewed - Mg - 1.8, K+ - 3.9 - Per IM recommendations in the past - patient's Mg  Needs to be >2 , K+ >4 , hence consulted Hospitalist - who recommended starting patient on Mg oxide 400 mg po daily .' Repeat Mg, BMP on Monday - 03/30/16- reviewed labs today - BMP - wnl, Mg- 1.8.Continue Mg oxide as scheduled.  Will continue 1:1 precaution for safety.  Will continue to monitor vitals ,medication compliance and treatment side effects while patient is here.  Will monitor for medical issues as well as call consult as needed.   CSW will continue working on disposition. Patient to be referred to ALF /day program. Pt referred to [facility] pending ALF placement if she is more stable.  03/30/16 - I have reviewed medical records from [facility] - admitted there from 01/06/16- 01/30/16 - she was discharged to boarding home from there - on depakote 750 mg bid, zyprexa 15 mg po qhs .  Patient to participate in therapeutic milieu   Eldonna Neuenfeldt, MD 03/30/2016, 12:18 PM

## 2016-03-31 LAB — GLUCOSE, CAPILLARY
Glucose-Capillary: 102 mg/dL — ABNORMAL HIGH (ref 65–99)
Glucose-Capillary: 103 mg/dL — ABNORMAL HIGH (ref 65–99)
Glucose-Capillary: 124 mg/dL — ABNORMAL HIGH (ref 65–99)
Glucose-Capillary: 94 mg/dL (ref 65–99)

## 2016-03-31 MED ORDER — DIVALPROEX SODIUM 500 MG PO DR TAB
500.0000 mg | DELAYED_RELEASE_TABLET | Freq: Every day | ORAL | Status: DC
  Administered 2016-04-01 – 2016-04-13 (×13): 500 mg via ORAL
  Filled 2016-03-31 (×15): qty 1

## 2016-03-31 MED ORDER — DIVALPROEX SODIUM 500 MG PO DR TAB
1000.0000 mg | DELAYED_RELEASE_TABLET | Freq: Every day | ORAL | Status: DC
  Administered 2016-03-31 – 2016-04-12 (×12): 1000 mg via ORAL
  Filled 2016-03-31 (×15): qty 2

## 2016-03-31 MED ORDER — HALOPERIDOL 5 MG PO TABS
5.0000 mg | ORAL_TABLET | Freq: Once | ORAL | Status: AC
  Administered 2016-03-31: 5 mg via ORAL
  Filled 2016-03-31 (×2): qty 1

## 2016-03-31 MED ORDER — LORAZEPAM 1 MG PO TABS
2.0000 mg | ORAL_TABLET | Freq: Once | ORAL | Status: AC
  Administered 2016-03-31: 2 mg via ORAL
  Filled 2016-03-31: qty 2

## 2016-03-31 MED ORDER — GABAPENTIN 300 MG PO CAPS
600.0000 mg | ORAL_CAPSULE | Freq: Every day | ORAL | Status: DC
  Administered 2016-03-31 – 2016-04-15 (×16): 600 mg via ORAL
  Filled 2016-03-31 (×16): qty 2
  Filled 2016-03-31: qty 14
  Filled 2016-03-31: qty 2

## 2016-03-31 MED ORDER — TRAZODONE HCL 50 MG PO TABS
25.0000 mg | ORAL_TABLET | Freq: Every day | ORAL | Status: DC
  Administered 2016-03-31 – 2016-04-15 (×16): 25 mg via ORAL
  Filled 2016-03-31 (×10): qty 0.5
  Filled 2016-03-31: qty 1
  Filled 2016-03-31 (×3): qty 0.5
  Filled 2016-03-31: qty 4
  Filled 2016-03-31 (×4): qty 0.5

## 2016-03-31 NOTE — Progress Notes (Signed)
D: Pt calm, asleep at this time. Respirations noted and unlabored. Tolerated EKG procedure well with increased verbal encouragement. Pt is eating and well. A: Emotional support and availability provided to pt. 1:1 observation continues as per order, assigned staff in attendance at all times.  R: Pt receptive to care. Remains safe on unit.

## 2016-03-31 NOTE — Progress Notes (Signed)
1:1 Note @ 2200  *next note @ 0200   Pt. is resting in bed with eyes closed at this time. Respirations even and unlabored. Pt. took all bedtime mediations without complication. Sitter within arms reach. Continue 1:1 for Pt's safety. Q 15 checks in effect.

## 2016-03-31 NOTE — Progress Notes (Signed)
Recreation Therapy Notes  Date: 03/31/16 Time: 1000 Location: 500 Hall Dayroom  Group Topic: Coping Skills  Goal Area(s) Addresses:  Patients will be able to identify positive coping skills. Patients will be able to identify benefits of using coping skills post d/c.  Intervention: Worksheet, pencils, dry erase marker, dry erase board  Activity: Mindmap.  Patients were given a worksheet of a a blank mind map.  As a group, patients filled in the first 8 boxes with triggers that require coping skills.  Patients were to then come up with 3 coping skills for each trigger identified by the group.  Once patients identified their coping skills, the group would fill in the remainder of the map on the board.  Education: PharmacologistCoping Skills, Building control surveyorDischarge Planning.   Education Outcome: Acknowledges understanding/In group clarification offered/Needs additional education.   Clinical Observations/Feedback: Pt did not attend group.    Caroll RancherMarjette Allisa Einspahr, LRT/CTRS         Caroll RancherLindsay, Chalet Kerwin A 03/31/2016 12:42 PM

## 2016-03-31 NOTE — [facility] Group Notes (Signed)
[facility] LCSW Group Therapy  03/31/2016 2:44 PM   Type of Therapy:  Group Therapy  Participation Level:  Active  Participation Quality:  Attentive  Affect:  Appropriate  Cognitive:  Appropriate  Insight:  Improving  Engagement in Therapy:  Engaged  Modes of Intervention:  Clarification, Education, Exploration and Socialization  Summary of Progress/Problems: Today's group focused on relapse prevention.  We defined the term, and then brainstormed on ways to prevent relapse. Invited.  Chose to not attend. [name], Champ Keetch B 03/31/2016 , 2:44 PM

## 2016-03-31 NOTE — Progress Notes (Signed)
  D: Pt is disruptive on the unit. Talking loudly and verbally aggressive with an mht. Staff allowed pt to go into the dayroom to watch tv in hopes of redirecting her. However, pt continued to be verbally aggressive and argumentative.  A: Received order for ativan and haldol. Admin medications.  Support and encouragement was offered. 1:1 continued for safety.  R: Pt remains safe.
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
## 2016-03-31 NOTE — Progress Notes (Addendum)
  DATA ACTION RESPONSE  Objective- Pt. is up and visible in the room with sitter, laying in bed asleep. Pt. had a short nap after shift change but woke up for a snack. Pt. presents with a animated/restless/hyperactive affect and mood. Pt's behavior had to be re-directed most times. Subjective- Denies having any SI/HI/AVH/Pain at this time. Pt. states "I feel good; You are my cousin".               Pt. continues to remain safe on the unit; 1:1 in effect.   1:1 interaction in private to establish rapport. Encouragement, education, & support given from staff. Meds. ordered and administered. Pt. is compliant with meds. PRN Ativan given and will re-eval as necessary. BS at bedtime was 94, no coverage.   Safety maintained with Q 15 checks. Continues to follow treatment plan and will monitor closely. No additonal questions/concerns noted.

## 2016-03-31 NOTE — Progress Notes (Signed)
Called to review EKGs of "septal infarct" -pt completely asymptomatic without chest discomfort, sob, dizziness, n/v -personally reviewed EKGs--sinus rhythm, non specific T-wave changes, no concerning ischemic changes, no AV block -vital signs completely normal -EKGs ordered for survelliance of QTc interval=  -if pt becomes symptomatic with cp, sob, n/v-->send to ED  DTat
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
## 2016-03-31 NOTE — Progress Notes (Signed)
   D:Pt laying in bed resting with eyes closed. Respirations even and unlabored. No distress noted. A: Monitor 1:1 for safety. R: Pt remains safe.   

## 2016-03-31 NOTE — Progress Notes (Signed)
  D: Pt continues to pace the hall, and at times can be difficult to redirect. Continues to focus on leaving the hosp and instructing staff to call her a ride, charge her phone, as well as others. Pt is still delusional believing that staff is related to her. Pt continues to have pressured speech. Pt has no questions or concerns.    A:  Support and encouragement was offered. 15 min checks continued for safety.  R: Pt remains safe.

## 2016-03-31 NOTE — Progress Notes (Signed)
D: Pt alert, visibly agitated. Pt was verbally abusive towards assigned 1:1 staff "you fat ass bitch, leave me the fuck alone, stop following me" when staff was trying to redirect her from the door. Pt continues to be loud, argumentative, verbally disruptive in milieu. Showered and changed clothes.  A: Continued support provided to pt. Medications given as prescribed. Fall precaution and 1:1 observation level maintained without incidents thus far.  R: Pt continues to require frequent redirections. Safety maintained on unit.

## 2016-03-31 NOTE — Progress Notes (Signed)
Adult Psychoeducational Group Note  Date:  03/31/2016 Time:  11:55 PM  Group Topic/Focus:  Wrap-Up Group:   The focus of this group is to help patients review their daily goal of treatment and discuss progress on daily workbooks.  Participation Level:  Did Not Attend  Participation Quality:  Did not attend  Affect:  Did not attend  Cognitive:  Did not attend  Insight: None  Engagement in Group:  Did not attend  Modes of Intervention:  Did not attend  Additional Comments:  Patient did not attend wrap up group this evening.  Daisa Stennis L Shareeka Yim 03/31/2016, 11:55 PM

## 2016-03-31 NOTE — Plan of Care (Signed)
Problem: Activity: Goal: Sleeping patterns will improve Outcome: Not Progressing Pt. slept 3.75 hrs last night.

## 2016-04-01 LAB — GLUCOSE, CAPILLARY
GLUCOSE-CAPILLARY: 88 mg/dL (ref 65–99)
GLUCOSE-CAPILLARY: 112 mg/dL — AB (ref 65–99)
GLUCOSE-CAPILLARY: 108 mg/dL — AB (ref 65–99)
Glucose-Capillary: 94 mg/dL (ref 65–99)

## 2016-04-01 NOTE — Progress Notes (Signed)
1:1 Note Pt is on the hall way talking to peers and staff, pt very labile and require a lot of redirections. No falls observed, will continue to monitor.

## 2016-04-01 NOTE — [facility] Group Notes (Signed)
[facility] Group Notes:  (Counselor/Nursing/MHT/Case Management/Adjunct)  04/01/2016 1:15PM  Type of Therapy:  Group Therapy  Participation Level:  Active  Participation Quality:  Appropriate  Affect:  Flat  Cognitive:  Oriented  Insight:  Improving  Engagement in Group:  Limited  Engagement in Therapy:  Limited  Modes of Intervention:  Discussion, Exploration and Socialization  Summary of Progress/Problems: The topic for group was balance in life.  Pt participated in the discussion about when their life was in balance and out of balance and how this feels.  Pt discussed ways to get back in balance and short term goals they can work on to get where they want to be.  In and out several times.  Engaged, but unable to follow the theme of the group. 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  [name], Shandi Godfrey B 04/01/2016 1:07 PM

## 2016-04-01 NOTE — Tx Team (Signed)
Interdisciplinary Treatment and Diagnostic Plan Update  04/01/2016 Time of Session: 3:39 PM  Catherine Bender MRN: [id number]  Principal Diagnosis: Schizoaffective disorder, bipolar type (HCC)  Secondary Diagnoses: Principal Problem:   Schizoaffective disorder, bipolar type (HCC) Active Problems:   Prolonged Q-T interval on ECG   Electrolyte imbalance   Current Medications:  Current Facility-Administered Medications  Medication Dose Route Frequency Provider Last Rate Last Dose  . acetaminophen (TYLENOL) tablet 650 mg  650 mg Oral Q6H PRN Kerry Hough, PA-C   650 mg at 03/31/16 4098  . alum & mag hydroxide-simeth (MAALOX/MYLANTA) 200-200-20 MG/5ML suspension 30 mL  30 mL Oral Q4H PRN Kerry Hough, PA-C   30 mL at 03/19/16 0021  . benztropine (COGENTIN) tablet 1 mg  1 mg Oral QHS Jomarie Longs, MD   1 mg at 03/31/16 2100  . divalproex (DEPAKOTE) DR tablet 1,000 mg  1,000 mg Oral QHS Jomarie Longs, MD   1,000 mg at 03/31/16 2100  . divalproex (DEPAKOTE) DR tablet 500 mg  500 mg Oral QPC breakfast Jomarie Longs, MD   500 mg at 04/01/16 0847  . gabapentin (NEURONTIN) capsule 600 mg  600 mg Oral QHS Jomarie Longs, MD   600 mg at 03/31/16 2100  . hydrocerin (EUCERIN) cream   Topical BID Saramma Eappen, MD      . insulin aspart (novoLOG) injection 0-15 Units  0-15 Units Subcutaneous TID WC Jomarie Longs, MD   Stopped at 03/31/16 1700  . insulin aspart (novoLOG) injection 0-5 Units  0-5 Units Subcutaneous QHS Saramma Eappen, MD      . lithium carbonate capsule 150 mg  150 mg Oral BID WC Jomarie Longs, MD   150 mg at 04/01/16 0752  . LORazepam (ATIVAN) tablet 1 mg  1 mg Oral Q6H PRN Jomarie Longs, MD   1 mg at 04/01/16 0032   Or  . LORazepam (ATIVAN) injection 1 mg  1 mg Intramuscular Q6H PRN Jomarie Longs, MD      . magnesium hydroxide (MILK OF MAGNESIA) suspension 30 mL  30 mL Oral Daily PRN Kerry Hough, PA-C      . magnesium oxide (MAG-OX) tablet 400 mg  400 mg Oral Daily  Jomarie Longs, MD   400 mg at 04/01/16 0752  . nicotine polacrilex (NICORETTE) gum 2 mg  2 mg Oral PRN Jomarie Longs, MD      . OLANZapine zydis (ZYPREXA) disintegrating tablet 20 mg  20 mg Oral QHS Jomarie Longs, MD   20 mg at 03/31/16 2100  . OLANZapine zydis (ZYPREXA) disintegrating tablet 5 mg  5 mg Oral Daily Jomarie Longs, MD   5 mg at 04/01/16 0753  . OXcarbazepine (TRILEPTAL) tablet 150 mg  150 mg Oral BID Jomarie Longs, MD   150 mg at 04/01/16 0754  . propranolol (INDERAL) tablet 10 mg  10 mg Oral BID Jomarie Longs, MD   10 mg at 04/01/16 0752  . temazepam (RESTORIL) capsule 30 mg  30 mg Oral QHS Jomarie Longs, MD   30 mg at 03/31/16 2100  . traZODone (DESYREL) tablet 25 mg  25 mg Oral QHS Jomarie Longs, MD   25 mg at 03/31/16 2101    PTA Medications: Prescriptions Prior to Admission  Medication Sig Dispense Refill Last Dose  . LITHIUM PO Take 1 tablet by mouth once.   Not Taking at Unknown time    Treatment Modalities: Medication Management, Group therapy, Case management,  1 to 1 session with clinician, Psychoeducation, Recreational therapy.  Physician Treatment Plan for Primary Diagnosis: Schizoaffective disorder, bipolar type (HCC) Long Term Goal(s): Improvement in symptoms so as ready for discharge  Short Term Goals: Compliance with prescribed medications will improve  Medication Management: Evaluate patient's response, side effects, and tolerance of medication regimen.  Therapeutic Interventions: 1 to 1 sessions, Unit Group sessions and Medication administration.  Evaluation of Outcomes: Progressing   2/15:  Will increase  Zyprexa to 20  mg po daily in divided doses. Dose adjustment to be done cautiously due to h/o QTC prolongation as well as her inability to walk , drowsiness. ( per daughter - at baseline she takes care of self and is better functioning - is not wheelchair bound) . Repeat EKG ( 03/17/16)- shows qtc- improving - however likely due to dose  reduction of offending agent. EKG  ( 03/19/16)- qtc wnl. EKG - 03/25/16 - qtc - wnl . Marland Kitchen.  Cogentin 0.5 mg po qhs for EPS. Will continue Depakote DR 500 mg po tid for mood sx. Depakote level on 03/18/16- 83 ( therapeutic)  Will continue Neurontin 400 mg po qhs for anxiety/restlessness. Will increase Trilpetal to 300 mg po bid - initiated yesterday at 150 mg po bid . She will receive an increase of 300 mg today and 300 mg bid tomorrow.  For insomnia: Discontinue Ambien due to lack of efficacy . Discontinue  Lunesta due to lack of efficacy , failed ambien trial , due to qtc prolongation - cannot be on some of the other sleep aids like doxepin, elavil , failed trazodone . Will increase Restoril to 30  mg po qhs today.  For hx of prolonged QTC: Repeat EKG showed qtc improving - will continue to monitor.Repeat EKG - 03/19/16- wnl .  03/23/16 - qt - 416 , qtc - 470 - will continue to monitor.Repeat EKG today- 03/25/16- qtc - wnl.  2/21:  For schizoaffective ZO:[id number]:reviewed as below - no changes made today Continue Zyprexa 25 mg po daily in divided doses for psychosis/mood sx. Depakote to be changed to Depakote DR 500 mg po daily and 1000 mg po qhs . Dosing schedule changed to help her sleep better , reduce restlessness at night. Increase Neurontin to 600 mg po qhs for restlessness/anxiety sx. Will continue cogentin 1 mg po qhs for EPS. Will continue Trilpetal 150 mg po bid . Could taper it off, once she is more stable on Li . Will continue Li 150 mg po bid - to augment her depakote . Li level on 04/04/16.    Insomnia:reviewed - no changes made Will continue Restoril 30 mg po qhs - she slept few nights on the same , but last night was restless again. Add trazodone 25 mg po qhs to augment the restoril , will be cautious since she also has a hx of EKG prolongation. ( Patient failed ambien, lunesta , doxepin , elavil  Trials)   Physician Treatment Plan for Secondary Diagnosis: Principal Problem:    Schizoaffective disorder, bipolar type (HCC) Active Problems:   Prolonged Q-T interval on ECG   Electrolyte imbalance   Long Term Goal(s): Improvement in symptoms so as ready for discharge  Short Term Goals: Ability to demonstrate self-control will improve  Medication Management: Evaluate patient's response, side effects, and tolerance of medication regimen.  Therapeutic Interventions: 1 to 1 sessions, Unit Group sessions and Medication administration.  Evaluation of Outcomes: Progressing   RN Treatment Plan for Primary Diagnosis: Schizoaffective disorder, bipolar type (HCC) Long Term Goal(s): Knowledge of disease and therapeutic regimen  to maintain health will improve  Short Term Goals: Ability to demonstrate self-control and Compliance with prescribed medications will improve  Medication Management: RN will administer medications as ordered by provider, will assess and evaluate patient's response and provide education to patient for prescribed medication. RN will report any adverse and/or side effects to prescribing provider.  Therapeutic Interventions: 1 on 1 counseling sessions, Psychoeducation, Medication administration, Evaluate responses to treatment, Monitor vital signs and CBGs as ordered, Perform/monitor CIWA, COWS, AIMS and Fall Risk screenings as ordered, Perform wound care treatments as ordered.  Evaluation of Outcomes: Progressing   LCSW Treatment Plan for Primary Diagnosis: Schizoaffective disorder, bipolar type (HCC) Long Term Goal(s): Safe transition to appropriate next level of care at discharge, Engage patient in therapeutic group addressing interpersonal concerns.  Short Term Goals: Engage patient in aftercare planning with referrals and resources and Increase skills for wellness and recovery  Therapeutic Interventions: Assess for all discharge needs, 1 to 1 time with Social worker, Explore available resources and support systems, Assess for adequacy in community  support network, Educate family and significant other(s) on suicide prevention, Complete Psychosocial Assessment, Interpersonal group therapy.  Evaluation of Outcomes: Progressing   Progress in Treatment: Attending groups: Intermittently Participating in groups: Not meaningfully Taking medication as prescribed: Yes, MD continues to assess for medication changes as needed Toleration medication: Yes, no side effects reported at this time Family/Significant other contact made: Yes, Ms. Delene Loll (daughter, [phone]) Patient understands diagnosis: No, limited insight Discussing patient identified problems/goals with staff: No Medical problems stabilized or resolved: Yes, patient now up and walking out of wheelchair Denies suicidal/homicidal ideation: Yes Issues/concerns per patient self-inventory: None Other: N/A  New problem(s) identified: discharge planning, currently homeless  New Short Term/Long Term Goal(s): Identify a safe discharge plan involving housing.    Discharge Plan or Barriers: Unknown at this time, CSW will continue to follow and assess for options regarding placement  2/12: PASSR evaluation pending at this time; CSW to facilitate search for placement. MD requesting [facility] referral be made 2/15:  PASSR evaluation on hold; pt not stabilized to the point of readiness for transfer.  Confirmation of [facility] wait list as of 2/14.  CSW attempting to contact mother to involve in dispositional planning 2/21:  Mother is willing to have patient there, and said she would contact pt's son in [place] bout bringing her to [place].  However, pt has been heard multiple times on phone telling mother whe will not come there and is staying locally.  Pt is refusing referral to ALF.  CSW has contacted [facility] about transitional housing-will visit pt on Friday  Reason for Continuation of Hospitalization: Anxiety Delusions  Medication stabilization Disorganization    Estimated Length of Stay: 3-5  days  Attendees: Patient:  04/01/2016  3:39 PM  Physician: Dr. Elna Breslow 04/01/2016  3:39 PM  Nursing: Esperanza Sheets, RN  04/01/2016  3:39 PM  RN Care Manager: Onnie Boer, CM RN 04/01/2016  3:39 PM  Social Worker: Richelle Ito LCSW 04/01/2016  3:39 PM  Recreational Therapist: Royston Cowper, RT 04/01/2016  3:39 PM  Other:  04/01/2016  3:39 PM  Other:  04/01/2016  3:39 PM  Other: 04/01/2016  3:39 PM    Scribe for Treatment Team: Richelle Ito, LCSW Clinical Social Work [phone]

## 2016-04-01 NOTE — Progress Notes (Signed)
D: Pt pleasantly psychotic this evening, . Pt  Pt is pleasant and cooperative. Pt took a shower this evening. Pt continues to need constant redirection, to maintain control. Pt continues to endorse that someone stole her money. Pt continues to refer to writer as "John my cousin". Pt very labile this evening going from anger to crying spells  To bouts of happiness.   A: Pt was offered support and encouragement. Pt was given scheduled medications. Pt was encourage to attend groups. Q 15 minute checks were done for safety.   R:Pt attends groups and interacts well with peers and staff. Pt is taking medication. Pt receptive to treatment and safety maintained on unit.

## 2016-04-01 NOTE — Plan of Care (Signed)
Problem: Safety: Goal: Ability to redirect hostility and anger into socially appropriate behaviors will improve Outcome: Progressing Pt has been redirected on the unit and has been apologizing for inappropriate behaviors this evening.

## 2016-04-01 NOTE — Progress Notes (Addendum)
1:1 Note @ 0200 *next note @ 0600  Pt. is resting in bed with eyes closed at this time. Respirations even and unlabored. Pt. woke up 1x and was loud and agitated with sitter around 0000.  Behavior had to be redirected; Staff had to verbal deescalate Pt. PRN Ativan given. Pt. was calm and proceeded to go back in bed with eyes closed. Sitter within arms reach. Continue 1:1 for Pt's safety. Q 15 checks in effect.

## 2016-04-01 NOTE — Progress Notes (Signed)
1:1 Note @ 0600  *next note @ 1000  Pt. is up around 0530 this a.m. Pt. was compliant with vitals/CBG this a.m. Was loud and continuously raging on disorganized conversation by herself. Had to be redirected by staff. Sitter within arms reach. Continue 1:1 for Pt's safety. Q 15 checks in effect.

## 2016-04-01 NOTE — Progress Notes (Signed)
1:1 Note. Pt is very labile, intrusive and gets agitated easily. Pt complained of staff following her around, pt pit on close observation, but still require a lot of redirections. No fall observed, will continue to monitor.

## 2016-04-01 NOTE — Progress Notes (Signed)
Recreation Therapy Notes  Date: 04/01/16 Time: 1000 Location: [address] Dayroom  Group Topic: Self-Esteem  Goal Area(s) Addresses:  Patient will identify positive ways to increase self-esteem. Patient will verbalize benefit of increased self-esteem.  Behavioral Response: Minimal  Intervention: Worksheet, colored pencils  Activity: Crest.  Patients were given a blank crest.  The crest was divided into four sections.  In each section, patients were to highlight something positive about themselves.  Patients could choose from topics such as proudest moment, best quality, biggest achievement, favorite activity, best feature or they could come up with different topics on their own.  Education:  Self-Esteem, Building control surveyorDischarge Planning.   Education Outcome: Acknowledges education/In group clarification offered/Needs additional education  Clinical Observations/Feedback: Pt stated that self-esteem helps "control identity".  Pt needed constant redirection from singing and focusing on being kicked out of her house.  Pt completed activity with help from tech.  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     Caroll RancherMarjette Lissandro Bender, LRT/CTRS      Caroll RancherLindsay, [name] 04/01/2016 11:29 AM

## 2016-04-01 NOTE — Progress Notes (Signed)
[facility] MD Progress Note  04/01/2016 1:47 PM Catherine Bender  MRN:  [id number] Subjective:Pt states " I will follow rules , can you discharge me back home ?."    Objective: Patient seen and chart reviewed.Discussed patient with treatment team.  Pt today seen as pressured , labile - however she has been able to control her behavior on the unit better than past days. Pt continues to ruminate about her stressors - does not want go to her mother or an ALF and wants to return home. Pt slept better last night . Pt is more steady when she walks , although she continues to need 1:1 precaution for safety reasons. Pt often noted on the phone , loud and agitated - mostly about her SSI benefits or finances . Pt also making delusional, grandiose statements which changes from day to day and several times a day - like " I am donald trump's cousin or I am going to work at this hospital as a doctor " and so on.    Principal Problem: Schizoaffective disorder, bipolar type (HCC) Diagnosis:   Patient Active Problem List   Diagnosis Date Noted  . Electrolyte imbalance [E87.8] 03/27/2016  . Prolonged Q-T interval on ECG [R94.31] 03/13/2016  . Schizoaffective disorder, bipolar type (HCC) [F25.0] 03/12/2016  . Diabetes mellitus (HCC) [E11.9] 03/12/2016   Total Time spent with patient: 25 minutes  Past Psychiatric History: Please see H&P.   Past Medical History:  Past Medical History:  Diagnosis Date  . Diabetes mellitus without complication [facility])     Past Surgical History:  Procedure Laterality Date  . umbical hernia repair     Family History: Please see H&P.  Family Psychiatric  History: Please see H&P.  Social History: Please see H&P.  History  Alcohol Use No     History  Drug Use No    Social History   Social History  . Marital status: Single    Spouse name: N/A  . Number of children: N/A  . Years of education: N/A   Social History Main Topics  . Smoking status: Current Every Day  Smoker    Packs/day: 0.25  . Smokeless tobacco: Never Used  . Alcohol use No  . Drug use: No  . Sexual activity: Not Asked   Other Topics Concern  . None   Social History Narrative  . None   Additional Social History:                         Sleep: Fair  Appetite:  Fair  Current Medications: Current Facility-Administered Medications  Medication Dose Route Frequency Provider Last Rate Last Dose  . acetaminophen (TYLENOL) tablet 650 mg  650 mg Oral Q6H PRN Kerry Hough, PA-C   650 mg at 03/31/16 1914  . alum & mag hydroxide-simeth (MAALOX/MYLANTA) 200-200-20 MG/5ML suspension 30 mL  30 mL Oral Q4H PRN Kerry Hough, PA-C   30 mL at 03/19/16 0021  . benztropine (COGENTIN) tablet 1 mg  1 mg Oral QHS Jomarie Longs, MD   1 mg at 03/31/16 2100  . divalproex (DEPAKOTE) DR tablet 1,000 mg  1,000 mg Oral QHS Jomarie Longs, MD   1,000 mg at 03/31/16 2100  . divalproex (DEPAKOTE) DR tablet 500 mg  500 mg Oral QPC breakfast Jomarie Longs, MD   500 mg at 04/01/16 0847  . gabapentin (NEURONTIN) capsule 600 mg  600 mg Oral QHS Jomarie Longs, MD   600 mg at 03/31/16  2100  . hydrocerin (EUCERIN) cream   Topical BID Latham Kinzler, MD      . insulin aspart (novoLOG) injection 0-15 Units  0-15 Units Subcutaneous TID WC Jomarie LongsSaramma Hanzel Pizzo, MD   Stopped at 03/31/16 1700  . insulin aspart (novoLOG) injection 0-5 Units  0-5 Units Subcutaneous QHS Nicollette Wilhelmi, MD      . lithium carbonate capsule 150 mg  150 mg Oral BID WC Jomarie LongsSaramma Porsha Skilton, MD   150 mg at 04/01/16 0752  . LORazepam (ATIVAN) tablet 1 mg  1 mg Oral Q6H PRN Jomarie LongsSaramma Geraldin Habermehl, MD   1 mg at 04/01/16 0032   Or  . LORazepam (ATIVAN) injection 1 mg  1 mg Intramuscular Q6H PRN Jomarie LongsSaramma Elena Davia, MD      . magnesium hydroxide (MILK OF MAGNESIA) suspension 30 mL  30 mL Oral Daily PRN Kerry HoughSpencer E Simon, PA-C      . magnesium oxide (MAG-OX) tablet 400 mg  400 mg Oral Daily Jomarie LongsSaramma Natalyia Innes, MD   400 mg at 04/01/16 0752  . nicotine polacrilex  (NICORETTE) gum 2 mg  2 mg Oral PRN Jomarie LongsSaramma Sorren Vallier, MD      . OLANZapine zydis (ZYPREXA) disintegrating tablet 20 mg  20 mg Oral QHS Jomarie LongsSaramma Jaimi Belle, MD   20 mg at 03/31/16 2100  . OLANZapine zydis (ZYPREXA) disintegrating tablet 5 mg  5 mg Oral Daily Jomarie LongsSaramma Deaira Leckey, MD   5 mg at 04/01/16 0753  . OXcarbazepine (TRILEPTAL) tablet 150 mg  150 mg Oral BID Jomarie LongsSaramma Demri Poulton, MD   150 mg at 04/01/16 0754  . propranolol (INDERAL) tablet 10 mg  10 mg Oral BID Jomarie LongsSaramma Javonna Balli, MD   10 mg at 04/01/16 0752  . temazepam (RESTORIL) capsule 30 mg  30 mg Oral QHS Jomarie LongsSaramma Anel Creighton, MD   30 mg at 03/31/16 2100  . traZODone (DESYREL) tablet 25 mg  25 mg Oral QHS Jomarie LongsSaramma Taylorann Tkach, MD   25 mg at 03/31/16 2101    Lab Results:  Results for orders placed or performed during the hospital encounter of 03/11/16 (from the past 48 hour(s))  Glucose, capillary     Status: Abnormal   Collection Time: 03/30/16  5:17 PM  Result Value Ref Range   Glucose-Capillary 137 (H) 65 - 99 mg/dL   Comment 1 Notify RN    Comment 2 Document in Chart   Glucose, capillary     Status: Abnormal   Collection Time: 03/30/16  9:02 PM  Result Value Ref Range   Glucose-Capillary 126 (H) 65 - 99 mg/dL  Glucose, capillary     Status: Abnormal   Collection Time: 03/31/16  6:36 AM  Result Value Ref Range   Glucose-Capillary 102 (H) 65 - 99 mg/dL  Glucose, capillary     Status: Abnormal   Collection Time: 03/31/16 12:03 PM  Result Value Ref Range   Glucose-Capillary 103 (H) 65 - 99 mg/dL   Comment 1 Notify RN    Comment 2 Document in Chart   Glucose, capillary     Status: Abnormal   Collection Time: 03/31/16  6:51 PM  Result Value Ref Range   Glucose-Capillary 124 (H) 65 - 99 mg/dL   Comment 1 Notify RN    Comment 2 Document in Chart   Glucose, capillary     Status: None   Collection Time: 03/31/16  9:02 PM  Result Value Ref Range   Glucose-Capillary 94 65 - 99 mg/dL  Glucose, capillary     Status: None   Collection Time: 04/01/16  6:20 AM   Result Value Ref Range   Glucose-Capillary 88 65 - 99 mg/dL  Glucose, capillary     Status: None   Collection Time: 04/01/16 12:00 PM  Result Value Ref Range   Glucose-Capillary 94 65 - 99 mg/dL   Comment 1 Notify RN    Comment 2 Document in Chart     Blood Alcohol level:  Lab Results  Component Value Date   ETH <5 03/11/2016   ETH <5 01/05/2016    Metabolic Disorder Labs: Lab Results  Component Value Date   HGBA1C 5.2 03/13/2016   MPG 103 03/13/2016   Lab Results  Component Value Date   PROLACTIN 27.9 (H) 03/13/2016   Lab Results  Component Value Date   CHOL 150 03/13/2016   TRIG 69 03/13/2016   HDL 43 03/13/2016   CHOLHDL 3.5 03/13/2016   VLDL 14 03/13/2016   LDLCALC 93 03/13/2016    Physical Findings: AIMS: Facial and Oral Movements Muscles of Facial Expression: None, normal Lips and Perioral Area: None, normal Jaw: None, normal Tongue: None, normal,Extremity Movements Upper (arms, wrists, hands, fingers): None, normal Lower (legs, knees, ankles, toes): None, normal, Trunk Movements Neck, shoulders, hips: None, normal, Overall Severity Severity of abnormal movements (highest score from questions above): None, normal Incapacitation due to abnormal movements: None, normal Patient's awareness of abnormal movements (rate only patient's report): No Awareness, Dental Status Current problems with teeth and/or dentures?: No Does patient usually wear dentures?: No  CIWA:    COWS:     Musculoskeletal: Strength & Muscle Tone: within normal limits Gait & Station: improving Patient leans: N/A  Psychiatric Specialty Exam: Physical Exam  Nursing note and vitals reviewed.   Review of Systems  Psychiatric/Behavioral: The patient is nervous/anxious.   All other systems reviewed and are negative.   Blood pressure 125/66, pulse 75, temperature 98.5 F (36.9 C), resp. rate 18, height 5\' 5"  (1.651 m), weight 92.5 kg (204 lb), SpO2 99 %.Body mass index is 33.95  kg/m.  General Appearance: Fairly Groomed  Eye Contact:  Fair  Speech:  Pressured  Volume:  normal often , increased at times   Mood:  Angry and Dysphoric on and off, calm at times  Affect:  Labile  Thought Process:  Linear and Descriptions of Associations: Circumstantial improving  Orientation:  Full (Time, Place, and Person)  Thought Content:  Delusions and Rumination focussed on returning to her house - and her rent   Suicidal Thoughts:  No  Homicidal Thoughts:  No  Memory:  Immediate;   Fair Recent;   Fair Remote;   Poor  Judgement:  Impaired  Insight:  Shallow  Psychomotor Activity:  Restlessness  Concentration:  Concentration: Fair and Attention Span: Fair  Recall:  [employer] of Knowledge:  Fair  Language:  Fair  Akathisia:  No  Handed:  Right  AIMS (if indicated):     Assets:  Desire for Improvement Talents/Skills  ADL's:  Intact  Cognition:  WNL  Sleep:  Number of Hours: 5.75   Schizoaffective disorder, bipolar type (HCC) unstable  Will continue today 04/01/16  plan as below except where it is noted.   Treatment Plan Summary:Patient today seen as more appropriate , her sleep was better last night . Pt however continues to get excited often about her rent /finances/her SSI - which are real problems she is facing , and is seen on the phone being loud , requiring redirection. Pt is however more redirectable now. She is more  steady with her gait now - however will continue constant supervision. Daily contact with patient to assess and evaluate symptoms and progress in treatment and Medication management    For schizoaffective ZO:XWRUEAVW as below - no changes made today Continue Zyprexa 25 mg po daily in divided doses for psychosis/mood sx. Depakote to be changed to Depakote DR 500 mg po daily and 1000 mg po qhs . Dosing schedule changed to help her sleep better , reduce restlessness at night. Increase Neurontin to 600 mg po qhs for restlessness/anxiety sx. Will  continue cogentin 1 mg po qhs for EPS. Will continue Trilpetal 150 mg po bid . Could taper it off, once she is more stable on Li . Will continue Li 150 mg po bid - to augment her depakote . Li level on 04/04/16.    Insomnia:reviewed - no changes made Will continue Restoril 30 mg po qhs - she slept few nights on the same , but last night was restless again. Add trazodone 25 mg po qhs to augment the restoril , will be cautious since she also has a hx of EKG prolongation. ( Patient failed ambien, lunesta , doxepin , elavil  Trials)    For restlessness/ Akathisia- reviewed - no changes made Added propranolol 10 mg po bid for possible akathisia /restlessness.   For prolonged qtc : Continue EKG monitoring.   Given PPD test  03/19/16 - for ALF placement- - negative .  DM: Will continue current medication regimen.  Labs reviewed - Mg - 1.8, K+ - 3.9 - Per IM recommendations in the past - patient's Mg  Needs to be >2 , K+ >4 , hence consulted Hospitalist - who recommended starting patient on Mg oxide 400 mg po daily .' Repeat Mg, BMP on Monday - 03/30/16- reviewed labs today - BMP - wnl, Mg- 1.8.Continue Mg oxide as scheduled.  Will continue 1:1 precaution for safety.  CSW will continue working on disposition. Patient to be referred to ALF /day program. Pt referred to [facility] pending ALF placement if she is more stable.  03/30/16 - I have reviewed medical records from [facility] - admitted there from 01/06/16- 01/30/16 - she was discharged to boarding home from there - on depakote 750 mg bid, zyprexa 15 mg po qhs .  Patient to participate in therapeutic milieu   Reyhan Moronta, MD 04/01/2016, 1:47 PM

## 2016-04-01 NOTE — Progress Notes (Signed)
1:1 Note Pt has been constantly requiring redirection, pt is labile and  Intrusive. No falls observed or reported at this time. Pt is maintained on 1:1 for safety, will continue to monitor.

## 2016-04-01 NOTE — Progress Notes (Signed)
   03/31/16 2144  Precautions  Precautions Fall risk  Adult Fall Risk Assessment  Risk Factor Category (scoring not indicated) Not Applicable  Age 46  Fall History: Fall within 6 months prior to admission 5  Elimination; Bowel and/or Urine Incontinence 2  Elimination; Bowel and/or Urine Urgency/Frequency 0  Medications: includes PCA/Opiates, Anti-convulsants, Anti-hypertensives, Diuretics, Hypnotics, Laxatives, Sedatives, and Psychotropics 5  Patient Care Equipment 0  Mobility-Assistance 0  Mobility-Gait 2  Mobility-Sensory Deficit 0  Altered awareness of immediate physical environment 0  Impulsiveness 2  Lack of understanding of one's physical/cognitive limitations 4  Total Score 20  Patient's Fall Risk High Fall Risk (>13 points)  Screening for Fall Injury Risk  Risk For Fall Injury- See Row Information  Nurse judgement;None identified  Injury Prevention Interventions Safety Sitter/Safety Rounder;Specialty Low Bed;Camera surveillance (with patient/family notification & education)  Adult Fall Risk Interventions  Required Bundle Interventions *See Row Information* High fall risk - low, moderate, and high requirements implemented  Additional Interventions Assess orthostatic BP  Safe Patient Handling Required Documentation-Repositioning Needs  Assist with movement in bed No  Fragile skin with pressure ulcer No  Unresponsive No  Safe Patient Handling Assessment  Ambulates independently Yes, no lift needed  Safe Environment  Patient oriented to unit and equipment Yes   Pt's ambulation is unsteady; unpredictable behavior.  Accessible walker and gait belt use for transport. Sitter within arms reach.

## 2016-04-02 DIAGNOSIS — E878 Other disorders of electrolyte and fluid balance, not elsewhere classified: Secondary | ICD-10-CM

## 2016-04-02 LAB — GLUCOSE, CAPILLARY
GLUCOSE-CAPILLARY: 99 mg/dL (ref 65–99)
GLUCOSE-CAPILLARY: 122 mg/dL — AB (ref 65–99)
Glucose-Capillary: 88 mg/dL (ref 65–99)
Glucose-Capillary: 94 mg/dL (ref 65–99)

## 2016-04-02 NOTE — Progress Notes (Signed)
[facility] Post 1:1 Observation Documentation  For the first (8) hours following discontinuation of 1:1 precautions, a progress note entry by nursing staff should be documented at least every 2 hours, reflecting the patient's behavior, condition, mood, and conversation.  Use the progress notes for additional entries.  Time 1:1 discontinued:  0925  Patient's Behavior:  Pt is pacing in the hallway and most of the time at the nurses station. Pt is loud and labile and require constant redirections.  Patient's Condition:  Pt is safe, using walker to move  Around.  Patient's Conversation: Pt said she getting ready for her discharge.   Bethann PunchesJane O Rona Tomson 04/02/2016, 4:26 PM

## 2016-04-02 NOTE — Progress Notes (Signed)
Pt went to the cafeteria for dinner, pt started acting out and being loud and stared cursing staff stating" I am gonna kick your ass if you don't back off" Pt was very disruptive, intrusive and very disorganized. Pt was redirected and brought back to the unit.

## 2016-04-02 NOTE — Progress Notes (Signed)
Nursing 1:1 note D:Pt observed sleeping in bed with eyes closed. RR even and unlabored. No distress noted. A: 1:1 observation continues for safety  R: pt remains safe  

## 2016-04-02 NOTE — Progress Notes (Signed)
Nursing 1:1 note D:Pt up at nursing station needing constant re-direction. RR even and unlabored. No distress noted . Pt continues to be delusional, pt continues to believe she has to go to work today.  A: 1:1 observation continues for safety  R: pt remains safe

## 2016-04-02 NOTE — [facility] Counselor (Addendum)
Spoke with Marcelle Smilingatasha, [phone] pt's daughter with pt present.  Both agreed they would like pt to return to same living situation and follow up at Day Program if both are possible.  Daughter to supply CSW with phone number for landlord to see if she can return. If not, daughter states she is fine with mother going to boarding house or Extended stay hotel.  "[facility], she and they agreed it was not the best fit for her.  She doesn't like to follow rules; refuses to follow rules.  She'll be OK in the community.  She makes friends and people keep an eye on her.  We've been going through this for years now."

## 2016-04-02 NOTE — Progress Notes (Signed)
  DATA ACTION RESPONSE  Objective- Pt. is up and visible in the milieu, interacting with staff and talking to self loudly. Pt. presents with an animated/anxious affect and mood. Pt appear to be doing well since 1:1 got d/c yesterday morning. Pt's appears to enjoy her independence and is causing less disruption overall. Writer witness Pt. use manners to staff and is pleasant this evening. Pt. remains unsteady but uses walker approprietly. Pt's behavior still in need of redirection. Subjective- Denies having any SI/HI/AVH/Pain at this time. Pt. states " I am at my right mind". Pt. proceed to take meds, eat her snacks, and went to bed independently without complication. Pt. continues to remain safe on the unit.  1:1 interaction in private to establish rapport. Encouragement, education, & support given from staff. Meds. ordered and administered.   Safety maintained with Q 15 checks. Continues to follow treatment plan and will monitor closely. No additonal questions/concerns noted.

## 2016-04-02 NOTE — Plan of Care (Signed)
Problem: Nutritional: Goal: Ability to achieve adequate nutritional intake will improve Outcome: Progressing Pt. is observed eating snacks and hydrating adequately. BS has been stable.

## 2016-04-02 NOTE — Progress Notes (Signed)
Adult Psychoeducational Group Note  Date:  04/02/2016 Time:  10:06 PM  Group Topic/Focus:  Wrap-Up Group:   The focus of this group is to help patients review their daily goal of treatment and discuss progress on daily workbooks.  Participation Level:  Active  Participation Quality:  Appropriate  Affect:  Appropriate  Cognitive:  Alert  Insight: Appropriate  Engagement in Group:  Engaged  Modes of Intervention:  Discussion  Additional Comments:  Patient states, "I had a terrible day". Patient's goal for today was "to ignore the devil".   Wrenn Willcox L Rayla Pember 04/02/2016, 10:06 PM

## 2016-04-02 NOTE — Progress Notes (Signed)
Recreation Therapy Notes  Date: 04/02/16 Time: 1000 Location: 500 Hall Dayroom  Group Topic: Leisure Education  Goal Area(s) Addresses:  Patient will identify positive leisure activities.  Patient will identify one positive benefit of participation in leisure activities.   Behavioral Response: Engaged  Intervention: Various leisure activities, dry erase marker, dry erase board, eraser  Activity: Leisure Pictionary.  Patients were to pull strips of paper from a can with a leisure activity on it.  Patients were to draw the activity they pulled on the board.  The remaining patients were to guess what the activity is.  The person who guesses the activity will then get an opportunity to pull and draw the next activity.  Education:  Leisure Education, Building control surveyorDischarge Planning  Education Outcome: Acknowledges education/In group clarification offered/Needs additional education  Clinical Observations/Feedback: Pt had better participation than in previous groups.  Pt still needed redirection from trying to use the phone during group and interrupting others.  Pt did get the opportunity to draw two leisure activities and showed some appropriate social interaction with peers.   Caroll RancherMarjette Areatha Kalata, LRT/CTRS      Caroll RancherLindsay, Sabrinia Prien A 04/02/2016 11:34 AM

## 2016-04-02 NOTE — Progress Notes (Signed)
[facility] Post 1:1 Observation Documentation  For the first (8) hours following discontinuation of 1:1 precautions, a progress note entry by nursing staff should be documented at least every 2 hours, reflecting the patient's behavior, condition, mood, and conversation.  Use the progress notes for additional entries.  Time 1:1 discontinued: 0925   Patient's Behavior:  Pt is at the nurses station talking to staff. Require redirection constantly.  Patient's Condition: pt is safe   Patient's Conversation: Pt stated felt good after her daughter visited her today. Bethann PunchesJane O Encarnacion Bender 04/02/2016, 4:51 PM

## 2016-04-02 NOTE — Progress Notes (Signed)
[facility]  04/02/2016 2:38 PM Delorse LimberRenitha Woznick  MRN:  [id number] Subjective: Pt states " I am fine , I just want my daughter,"   Objective: Patient seen and chart reviewed.Discussed patient with treatment team.  Pt today seen as more calm , is more redirectable . Pt is less pressured and easy to understand. Pt is focussed on discharge and hence discussed goals with her. Pt agrees to follow rules and attend groups . Pt per RN is seen as loud on and off , however is redirectable. Pt tolerating medications well, denies ADRs.   Per CSW - spoke to daughter Marcelle Smilingatasha who visited - with patient present - daughter feels patient will do better if she returns to her same living situation and [facility] since they have tried it in the past and she did not do well.    Principal Problem: Schizoaffective disorder, bipolar type (HCC) Diagnosis:   Patient Active Problem List   Diagnosis Date Noted  . Electrolyte imbalance [E87.8] 03/27/2016  . Prolonged Q-T interval on ECG [R94.31] 03/13/2016  . Schizoaffective disorder, bipolar type (HCC) [F25.0] 03/12/2016  . Diabetes mellitus (HCC) [E11.9] 03/12/2016   Total Time spent with patient: 25 minutes  Past Psychiatric History: Please see H&P.   Past Medical History:  Past Medical History:  Diagnosis Date  . Diabetes mellitus without complication [facility](HCC)     Past Surgical History:  Procedure Laterality Date  . umbical hernia repair     Family History: Please see H&P.  Family Psychiatric  History: Please see H&P.  Social History: Please see H&P.  History  Alcohol Use No     History  Drug Use No    Social History   Social History  . Marital status: Single    Spouse name: N/A  . Number of children: N/A  . Years of education: N/A   Social History Main Topics  . Smoking status: Current Every Day Smoker    Packs/day: 0.25  . Smokeless tobacco: Never Used  . Alcohol use No  . Drug use: No  . Sexual  activity: Not Asked   Other Topics Concern  . None   Social History Narrative  . None   Additional Social History:                         Sleep: Fair  Appetite:  Fair  Current Medications: Current Facility-Administered Medications  Medication Dose Route Frequency Provider Last Rate Last Dose  . acetaminophen (TYLENOL) tablet 650 mg  650 mg Oral Q6H PRN Kerry HoughSpencer E Simon, PA-C   650 mg at 03/31/16 [id number]  . alum & mag hydroxide-simeth (MAALOX/MYLANTA) 200-200-20 MG/5ML suspension 30 mL  30 mL Oral Q4H PRN Kerry HoughSpencer E Simon, PA-C   30 mL at 03/19/16 0021  . benztropine (COGENTIN) tablet 1 mg  1 mg Oral QHS Jomarie LongsSaramma Hidaya Daniel, MD   1 mg at 04/01/16 2143  . divalproex (DEPAKOTE) DR tablet 1,000 mg  1,000 mg Oral QHS Jomarie LongsSaramma Leeloo Silverthorne, MD   1,000 mg at 04/01/16 2144  . divalproex (DEPAKOTE) DR tablet 500 mg  500 mg Oral QPC breakfast Jomarie LongsSaramma Marlisha Vanwyk, MD   500 mg at 04/02/16 0758  . gabapentin (NEURONTIN) capsule 600 mg  600 mg Oral QHS Jomarie LongsSaramma Kayci [place], MD   600 mg at 04/01/16 2144  . hydrocerin (EUCERIN) cream   Topical BID Mysha Peeler, MD      . insulin aspart (novoLOG) injection  0-15 Units  0-15 Units Subcutaneous TID WC Jomarie Longs, MD   Stopped at 03/31/16 1700  . insulin aspart (novoLOG) injection 0-5 Units  0-5 Units Subcutaneous QHS Abrielle Finck, MD      . lithium carbonate capsule 150 mg  150 mg Oral BID WC Jomarie Longs, MD   150 mg at 04/02/16 0758  . LORazepam (ATIVAN) tablet 1 mg  1 mg Oral Q6H PRN Jomarie Longs, MD   1 mg at 04/02/16 0404   Or  . LORazepam (ATIVAN) injection 1 mg  1 mg Intramuscular Q6H PRN Jomarie Longs, MD      . magnesium hydroxide (MILK OF MAGNESIA) suspension 30 mL  30 mL Oral Daily PRN Kerry Hough, PA-C      . magnesium oxide (MAG-OX) tablet 400 mg  400 mg Oral Daily Bonifacio Pruden, MD   400 mg at 04/02/16 0759  . nicotine polacrilex (NICORETTE) gum 2 mg  2 mg Oral PRN Jomarie Longs, MD      . OLANZapine zydis (ZYPREXA) disintegrating  tablet 20 mg  20 mg Oral QHS Jomarie Longs, MD   20 mg at 04/01/16 2144  . OLANZapine zydis (ZYPREXA) disintegrating tablet 5 mg  5 mg Oral Daily Jomarie Longs, MD   5 mg at 04/02/16 0758  . OXcarbazepine (TRILEPTAL) tablet 150 mg  150 mg Oral BID Jomarie Longs, MD   150 mg at 04/02/16 0802  . propranolol (INDERAL) tablet 10 mg  10 mg Oral BID Jomarie Longs, MD   10 mg at 04/02/16 0758  . temazepam (RESTORIL) capsule 30 mg  30 mg Oral QHS Jomarie Longs, MD   30 mg at 04/01/16 2144  . traZODone (DESYREL) tablet 25 mg  25 mg Oral QHS Jomarie Longs, MD   25 mg at 04/01/16 2143    Lab Results:  Results for orders placed or performed during the hospital encounter of 03/11/16 (from the past 48 hour(s))  Glucose, capillary     Status: Abnormal   Collection Time: 03/31/16  6:51 PM  Result Value Ref Range   Glucose-Capillary 124 (H) 65 - 99 mg/dL   Comment 1 Notify RN    Comment 2 Document in Chart   Glucose, capillary     Status: None   Collection Time: 03/31/16  9:02 PM  Result Value Ref Range   Glucose-Capillary 94 65 - 99 mg/dL  Glucose, capillary     Status: None   Collection Time: 04/01/16  6:20 AM  Result Value Ref Range   Glucose-Capillary 88 65 - 99 mg/dL  Glucose, capillary     Status: None   Collection Time: 04/01/16 12:00 PM  Result Value Ref Range   Glucose-Capillary 94 65 - 99 mg/dL   Comment 1 Notify RN    Comment 2 Document in Chart   Glucose, capillary     Status: Abnormal   Collection Time: 04/01/16  5:00 PM  Result Value Ref Range   Glucose-Capillary 112 (H) 65 - 99 mg/dL  Glucose, capillary     Status: Abnormal   Collection Time: 04/01/16  8:09 PM  Result Value Ref Range   Glucose-Capillary 108 (H) 65 - 99 mg/dL  Glucose, capillary     Status: None   Collection Time: 04/02/16  6:19 AM  Result Value Ref Range   Glucose-Capillary 88 65 - 99 mg/dL  Glucose, capillary     Status: None   Collection Time: 04/02/16 11:54 AM  Result Value Ref Range    Glucose-Capillary  94 65 - 99 mg/dL   Comment 1 Notify RN     Blood Alcohol level:  Lab Results  Component Value Date   ETH <5 03/11/2016   ETH <5 01/05/2016    Metabolic Disorder Labs: Lab Results  Component Value Date   HGBA1C 5.2 03/13/2016   MPG 103 03/13/2016   Lab Results  Component Value Date   PROLACTIN 27.9 (H) 03/13/2016   Lab Results  Component Value Date   CHOL 150 03/13/2016   TRIG 69 03/13/2016   HDL 43 03/13/2016   CHOLHDL 3.5 03/13/2016   VLDL 14 03/13/2016   LDLCALC 93 03/13/2016    Physical Findings: AIMS: Facial and Oral Movements Muscles of Facial Expression: None, normal Lips and Perioral Area: None, normal Jaw: None, normal Tongue: None, normal,Extremity Movements Upper (arms, wrists, hands, fingers): None, normal Lower (legs, knees, ankles, toes): None, normal, Trunk Movements Neck, shoulders, hips: None, normal, Overall Severity Severity of abnormal movements (highest score from questions above): None, normal Incapacitation due to abnormal movements: None, normal Patient's awareness of abnormal movements (rate only patient's report): No Awareness, Dental Status Current problems with teeth and/or dentures?: No Does patient usually wear dentures?: No  CIWA:    COWS:     Musculoskeletal: Strength & Muscle Tone: within normal limits Gait & Station: fair Patient leans: N/A  Psychiatric Specialty Exam: Physical Exam  Nursing note and vitals reviewed.   Review of Systems  Psychiatric/Behavioral: Positive for depression. The patient is nervous/anxious.   All other systems reviewed and are negative.   Blood pressure (!) 146/89, pulse 83, temperature 98.6 F (37 C), temperature source Oral, resp. rate 16, height 5\' 5"  (1.651 m), weight 92.5 kg (204 lb), SpO2 99 %.Body mass index is 33.95 kg/m.  General Appearance: Fairly Groomed  Eye Contact:  Fair  Speech:  Pressured  Volume:  normal , loud on and off    Mood:  Anxious on and off ,  mostly about her situation, her finances   Affect:  Appropriate  Thought Process:  Linear and Descriptions of Associations: Circumstantial improving  Orientation:  Full (Time, Place, and Person)  Thought Content:  Delusions and Rumination makes delusional comments on and off, but is not preoccupied with it   Suicidal Thoughts:  No  Homicidal Thoughts:  No  Memory:  Immediate;   Fair Recent;   Fair Remote;   Poor  Judgement:  Impaired  Insight:  Shallow  Psychomotor Activity:  Restlessness  Concentration:  Concentration: Fair and Attention Span: Fair  Recall:  [employer] of Knowledge:  Fair  Language:  Fair  Akathisia:  No  Handed:  Right  AIMS (if indicated):     Assets:  Desire for Improvement Talents/Skills  ADL's:  Intact  Cognition:  WNL  Sleep:  Number of Hours: 4.75   Schizoaffective disorder, bipolar type (HCC) unstable - improving  Will continue today 04/02/16  plan as below except where it is noted.   Treatment Plan Summary: Patient today seen as calm , although often intrusive and labile , is more redirectable. Pt taken off 1:1 precaution and to monitor how she does today. Pt 's daughter visited and CSW was able to discuss disposition plan. Please see CSW notes in EHR.  Daily contact with patient to assess and evaluate symptoms and progress in treatment and Medication management   For schizoaffective do: reviewed - no changes made. Continue Zyprexa 25 mg po daily in divided doses for psychosis/mood sx. Depakote to be  changed to Depakote DR 500 mg po daily and 1000 mg po qhs . Dosing schedule changed to help her sleep better , reduce restlessness at night. Increase Neurontin to 600 mg po qhs for restlessness/anxiety sx. Will continue cogentin 1 mg po qhs for EPS. Will continue Trilpetal 150 mg po bid . Could taper it off, once she is more stable on Li . Will continue Li 150 mg po bid - to augment her depakote . Li level on 04/04/16.    For insomnia:reviewed as  below. Restoril 30 mg po qhs. Trazodone 25 mg po qhs to augment Restoril. (will be cautious since she also has a hx of EKG prolongation.) ( Patient failed ambien, lunesta , doxepin , elavil  Trials)    For restlessness/ Akathisia- reviewed - no changes made Added propranolol 10 mg po bid for possible akathisia /restlessness.   For prolonged qtc : Continue EKG monitoring.   Given PPD test  03/19/16 - for ALF placement- - negative .  DM: Will continue current medication regimen.  Labs reviewed - Mg - 1.8, K+ - 3.9 - Per IM recommendations in the past - patient's Mg  Needs to be >2 , K+ >4 , hence consulted Hospitalist - who recommended starting patient on Mg oxide 400 mg po daily .' Repeat Mg, BMP on Monday - 03/30/16- reviewed labs today - BMP - wnl, Mg- 1.8.Continue Mg oxide as scheduled.  Will discontinue 1:1 precaution.  CSW will continue working on disposition. Patient to be referred to ALF /day program. Pt referred to [facility] pending ALF placement if she is more stable.  03/30/16 - I have reviewed medical records from [facility] - admitted there from 01/06/16- 01/30/16 - she was discharged to boarding home from there - on depakote 750 mg bid, zyprexa 15 mg po qhs .  Patient to participate in therapeutic milieu   Asheton Scheffler, MD 04/02/2016, 2:38 PM

## 2016-04-02 NOTE — Progress Notes (Signed)
Nursing 1:1 note D:Pt observed up in the hallway, needing constant re-direction. RR even and unlabored. No distress noted. A: 1:1 observation continues for safety  R: pt remains safe

## 2016-04-02 NOTE — Progress Notes (Signed)
[facility] Post 1:1 Observation Documentation  For the first (8) hours following discontinuation of 1:1 precautions, a progress note entry by nursing staff should be documented at least every 2 hours, reflecting the patient's behavior, condition, mood, and conversation.  Use the progress notes for additional entries.  Time 1:1 discontinued:  0925  Patient's Behavior: Pt back from lunch, pt is calm althogh felt like she needed more time at the cafeteria.   Patient's Condition:  Pt is safe in the unit, no fall reported  Patient's Conversation:  Pt stated she is happy to be off 1:1 observation.  Bethann PunchesJane O Joan Avetisyan 04/02/2016, 4:41 PM

## 2016-04-02 NOTE — [facility] Group Notes (Signed)
Type of Therapy:  Group Therapy   Participation Level:  Engaged  Participation Quality:  Attentive  Affect:  Appropriate   Cognitive:  Alert   Insight:  Engaged  Engagement in Therapy:  Improving   Mode s of Intervention:  Education, Exploration, Socialization   Summary of Progress/Problems: Engaged throughout.  Catherine Bender came to group about 20 minutes late. She was engaged majority of her stay during the group session. Catherine Bender had a short outburst during Tammi's presentation, however was easily re-directed and was not intrusive the remainder of the group.   Tammi from [facility] [employer] was here to tell her story of recovery and inform patients about [facility] and their services.

## 2016-04-02 NOTE — Progress Notes (Signed)
[facility] Post 1:1 Observation Documentation  For the first (8) hours following discontinuation of 1:1 precautions, a progress note entry by nursing staff should be documented at least every 2 hours, reflecting the patient's behavior, condition, mood, and conversation.  Use the progress notes for additional entries.  Time 1:1 discontinued:  0925  Patient's Behavior:  Pt is calm at this time and agreed to follow the unit rules.  Patient's Condition:  Pt is safe, no falls observed at this time.  Patient's Conversation:  Pt was explained to the rule, and a copy of what is expected of her was handed to her and agreed to follow.  Bethann PunchesJane O Damilola Flamm 04/02/2016, 10:09 AM

## 2016-04-03 LAB — GLUCOSE, CAPILLARY
Glucose-Capillary: 130 mg/dL — ABNORMAL HIGH (ref 65–99)
Glucose-Capillary: 105 mg/dL — ABNORMAL HIGH (ref 65–99)
Glucose-Capillary: 117 mg/dL — ABNORMAL HIGH (ref 65–99)
Glucose-Capillary: 145 mg/dL — ABNORMAL HIGH (ref 65–99)

## 2016-04-03 MED ORDER — PROPRANOLOL HCL 10 MG PO TABS
10.0000 mg | ORAL_TABLET | Freq: Three times a day (TID) | ORAL | Status: DC
  Administered 2016-04-03 – 2016-04-16 (×37): 10 mg via ORAL
  Filled 2016-04-03 (×19): qty 1
  Filled 2016-04-03: qty 21
  Filled 2016-04-03 (×6): qty 1
  Filled 2016-04-03: qty 21
  Filled 2016-04-03 (×9): qty 1
  Filled 2016-04-03: qty 21
  Filled 2016-04-03 (×9): qty 1

## 2016-04-03 NOTE — [facility] Group Notes (Signed)
[facility] LCSW Group Therapy  04/03/2016  1:05 PM  Type of Therapy:  Group therapy  Participation Level:  Active  Participation Quality:  Attentive  Affect:  Flat  Cognitive:  Oriented  Insight:  Limited  Engagement in Therapy:  Limited  Modes of Intervention:  Discussion, Socialization  Summary of Progress/Problems:  Chaplain was here to lead a group on themes of hope and courage. Invested in being in group today.  In and out a couple of times.  Needed lots of redirection and limits as she kept talking out, but later was able to follow directions and hold up her hand until called on.  Catherine Bender, Catherine Bender 04/03/2016 3:29 PM

## 2016-04-03 NOTE — Progress Notes (Signed)
Recreation Therapy Notes  Date: 04/03/26 Time: 1000 Location: 500 Hall Dayroom  Group Topic: Communication, Team Building, Problem Solving  Goal Area(s) Addresses:  Patient will effectively work with peer towards shared goal.  Patient will identify skill used to make activity successful.  Patient will identify how skills used during activity can be used to reach post d/c goals.   Behavioral Response: Minimal  Intervention: STEM Activity   Activity: [employer] Tower. In teams, patients were asked to build the tallest freestanding tower possible out of 15 pipe cleaners. Systematically resources were removed, for example patient ability to use both hands and patient ability to verbally communicate.    Education: Pharmacist, communityocial Skills, Building control surveyorDischarge Planning.   Education Outcome: Acknowledges education/In group clarification offered/Needs additional education.   Clinical Observations/Feedback: Pt worked well with her peers.  Pt needed redirection from singing in group and interrupting others.   Caroll RancherMarjette Janell Keeling, LRT/CTRS     Caroll RancherLindsay, [place] A 04/03/2016 11:52 AM

## 2016-04-03 NOTE — Progress Notes (Signed)
DAR NOTE: Pt present with flat affect and depressed mood in the unit. Pt has been very labile, intrusive and verbally abusive towards staff. Pt has been threatening to shoot staff with her gun. Pt require constant redirection. Pt denies physical pain, took all her meds as scheduled. As per self inventory, pt had a good night sleep, good appetite, normal energy, and good concentration. Pt rate depression at 0, hopeless ness at 0. Pt's safety ensured with 15 minute and environmental checks. Pt currently denies SI/HI and A/V hallucinations. Pt verbally agrees to seek staff if SI/HI or A/VH occurs and to consult with staff before acting on these thoughts. Will continue POC.

## 2016-04-03 NOTE — Progress Notes (Addendum)
Nursing Progress Note 7p-7a  D) Patient presents with flat affect and loud, argumentative speech. Patient is tangential when speaking with a flight of ideas. Patient had a visitor this evening and was seen interacting with him and peers in the dayroom. Patient is intrusive to staff and demanding. Patient comes to the nurses station often. Patient states tonight that "I need an inhaler, I am going to die without it. Call my doctor, I have been seeing him since I was 46 years old and now I am 46". Patient in no distress without any difficulty breathing. Patient states "my foot is broken, I need you to call my sister and have her come help me with a shower". Patient walking on foot with no apparent injury. Feet assessed with dry, cracking heals. Moisturizing lotion applied. Lower extremities are noticed to have +1 pitting edema. Staff offered to assist patient, patient stated "no one here can help me". Patient provided a shower chair. Patient states "you know my ass can't fit in there" and removed shower chair from room. Patient did shower by herself without issues.  Patient able to perform ADLs independently. Patient denies SI/HI/AVH or pain when asked. Patient contracts for safety at this time. Patient did attend group. Patient medicated as prescribed but was hesitant to take medicines at first. Patient stated "this is a lot of pills. I don't like taking pills. I chose God to heal me read the bible". Patient did take medications voluntarily.   A) Emotional support given. 1:1 interaction and active listening provided. Patient medicated with PM orders as prescribed. Medications reviewed with patient. Patient verbalized understanding of medications without further questions.  Snacks and fluids provided. Opportunities for questions or concerns presented to patient. Labs, vital signs and patient behavior monitored throughout shift. Patient safety maintained with q15 min safety checks.  R) Patient receptive to  interaction with nurse. Patient remains safe on the unit at this time. Patient denies any adverse medication reactions at this time. Patient is resting and awake for small intervals at a time. Will continue to monitor.

## 2016-04-03 NOTE — Progress Notes (Signed)
Adult Psychoeducational Group Note  Date:  04/03/2016 Time:  9:09 PM  Group Topic/Focus:  Wrap-Up Group:   The focus of this group is to help patients review their daily goal of treatment and discuss progress on daily workbooks.  Participation Level:  Active  Participation Quality:  Appropriate  Affect:  Appropriate  Cognitive:  Appropriate  Insight: Appropriate  Engagement in Group:  Engaged  Modes of Intervention:  Discussion  Additional Comments:  The patient expressed that she had a good day.The patient also said that she attended groups.  Octavio Mannshigpen, Ayham Word Lee 04/03/2016, 9:09 PM

## 2016-04-03 NOTE — Progress Notes (Signed)
Pt. had a urinary incontinent episode during this hour. Pt. then became tearful. Linens changed and snack given. Pt. proceeded to go back to room to eat and was calm.
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
## 2016-04-04 ENCOUNTER — Encounter (HOSPITAL_COMMUNITY): Payer: Self-pay | Admitting: Registered Nurse

## 2016-04-04 LAB — GLUCOSE, CAPILLARY
GLUCOSE-CAPILLARY: 91 mg/dL (ref 65–99)
GLUCOSE-CAPILLARY: 97 mg/dL (ref 65–99)
GLUCOSE-CAPILLARY: 103 mg/dL — AB (ref 65–99)
Glucose-Capillary: 136 mg/dL — ABNORMAL HIGH (ref 65–99)

## 2016-04-04 LAB — LITHIUM LEVEL: Lithium Lvl: 0.12 mmol/L — ABNORMAL LOW (ref 0.60–1.20)
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
## 2016-04-04 NOTE — [facility] Group Notes (Signed)
[facility] Group Notes: (Clinical Social Work)   04/04/2016      Type of Therapy:  Group Therapy   Participation Level:  Did Not Attend despite MHT prompting - Patient was in the room initially and left.  About 15 minutes in to group she returned and started talking about unrelated things including delusional statements despite the fact that other patients were talking at that time.  She was asked to stop numerous times to no avail, with various redirections techniques utilized and ultimately stormed loudly out of the room cursing at CSW for "being disrespectful."   Ambrose MantleMareida Grossman-Orr, LCSW 04/04/2016, 12:51 PM
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
## 2016-04-04 NOTE — Progress Notes (Signed)
Nursing Progress Note 7p-7a  D) Patient presents irritable, intrusive and demanding. Patient is labile, loud and tangential when speaking. Patient singing at the start of shift and stating "Susa LofflerBarack Obama is my cousin". Patient quickly becomes irritable and agitated when staff tries to redirect patient. Patient disruptive to peers in the dayroom.Patient denies SI/HI/AVH but reports left foot pain. Patient provided heat packs for feet and encouraged to rest. Lower extremity swelling noted. Patient feet elevated by staff when possible.  A) Emotional support given. 1:1 interaction and active listening provided. Patient medicated with PM orders as prescribed. Snacks and fluids provided. Patient safety maintained with q15 min safety checks.  R) Patient remains safe on the unit at this time. Patient is resting in bed without complaints. Will continue to monitor.

## 2016-04-04 NOTE — Plan of Care (Signed)
Problem: Role Relationship: Goal: Ability to interact with others will improve Outcome: Not Progressing Patient intrusive with peers. Patient becomes irritable with staff when staff attempts to redirect.  Problem: Self-Care: Goal: Ability to participate in self-care as condition permits will improve Outcome: Progressing Patient able to perform ADLs independently. Patient combed hair, took a shower, toileted herself and changed clothing today.

## 2016-04-04 NOTE — Plan of Care (Signed)
Problem: Activity: Goal: Interest or engagement in activities will improve Outcome: Not Progressing Pt has been out in the dayroom much of the morning. She continues to talk rapidly, talking above others without much back and forth communication.

## 2016-04-04 NOTE — Plan of Care (Signed)
Problem: Health Behavior/Discharge Planning: Goal: Compliance with treatment plan for underlying cause of condition will improve Outcome: Progressing Patient took medications as prescribed. Medications reviewed with patient. Patient attended group this evening.

## 2016-04-04 NOTE — Progress Notes (Signed)
D:Pt is loud and talks constantly on the unit. Pt is tangential in her speech and labile in her mood. She talks from one subject to another, cursing one minute and apologizing the next. Pt c/o a headache and was given prn Tylenol.  A:Offered support, encouragement and redirection. R:Safety maintained on the unit.

## 2016-04-05 LAB — GLUCOSE, CAPILLARY
GLUCOSE-CAPILLARY: 106 mg/dL — AB (ref 65–99)
GLUCOSE-CAPILLARY: 120 mg/dL — AB (ref 65–99)
Glucose-Capillary: 91 mg/dL (ref 65–99)
Glucose-Capillary: 132 mg/dL — ABNORMAL HIGH (ref 65–99)

## 2016-04-05 MED ORDER — FUROSEMIDE 40 MG PO TABS
40.0000 mg | ORAL_TABLET | Freq: Once | ORAL | Status: AC
Start: 2016-04-05 — End: 2016-04-05
  Administered 2016-04-05: 40 mg via ORAL
  Filled 2016-04-05: qty 2
  Filled 2016-04-05: qty 1

## 2016-04-05 NOTE — Plan of Care (Signed)
Problem: Physical Regulation: Goal: Ability to maintain clinical measurements within normal limits will improve Outcome: Progressing Patient's blood sugar levels have been within normal limits. HS reading was 120. No insulin coverage needed at this time.

## 2016-04-05 NOTE — Progress Notes (Signed)
Adult Psychoeducational Group Note  Date:  04/05/2016 Time:  8:41 PM  Group Topic/Focus:  Wrap-Up Group:   The focus of this group is to help patients review their daily goal of treatment and discuss progress on daily workbooks.  Participation Level:  Active  Participation Quality:  Appropriate  Affect:  Appropriate  Cognitive:  Alert  Insight: Appropriate  Engagement in Group:  Engaged  Modes of Intervention:  Discussion  Additional Comments:  Patient stated she had a good day.   Kenasia Scheller L Taleeyah Bora 04/05/2016, 8:41 PM

## 2016-04-05 NOTE — [facility] Group Notes (Signed)
[facility] Group Notes:  (Clinical Social Work)  04/05/2016  11:00AM-12:00PM  Summary of Progress/Problems:  The main focus of today's process group was to listen to a variety of genres of music and to identify that different types of music provoke different responses.  The patient then was able to identify personally what was soothing for them, as well as energizing, as well as how patient can personally use this knowledge in sleep habits, with depression, and with other symptoms.  The patient expressed at the beginning of group the overall feeling of good and enjoyed the group.  Type of Therapy:  Music Therapy   Participation Level:  Active  Participation Quality:  Attentive and Sharing  Affect:  Blunted  Cognitive:  Oriented  Insight:  Engaged  Engagement in Therapy:  Engaged  Modes of Intervention:   Activity, Exploration  Ambrose MantleMareida Grossman-Orr, LCSW 04/05/2016

## 2016-04-05 NOTE — Progress Notes (Addendum)
Nursing Progress Note 7p-7a  D) Patient presents irritable and labile. Patient is loud, hyperverbal and intrusive with peers and staff on the unit. Patient constantly at nurses station, singing and making grandeur statements. Patient initially refused to take meds from writer, but with help from staff was encouraged to take medicine. Patient took medications except for Depakote order stating "I can't take these, I am pregnant and it will hurt my baby".  Patient denies SI/HI/AVH or pain. Patient contracts for safety on the unit. Patient did attend group. Lotion applied to feet and heels, swelling noted in lower extremities bilaterally.  A) Emotional support given. 1:1 interaction and active listening provided. Patient medicated with PM orders as prescribed except for Depakote. Will reapproach patient later this shift about taking her Depakote. Medications reviewed with patient.   Snacks and fluids provided. Labs, vital signs and patient behavior monitored throughout shift. Patient safety maintained with q15 min safety checks.  R) Patient remains safe on the unit at this time. Will continue to monitor.

## 2016-04-05 NOTE — Progress Notes (Signed)
DAR NOTE: Patient presents with anxious affect and depressed mood.  Pt continue to be loud, irritable and agitated in the united. Pt has been observed cursing and verbally threatening staff. PT has been intrusive and make other pts irritable. Denies pain, auditory and visual hallucinations.  Rates depression at 0, hopelessness at 0 , and anxiety at 0.  Maintained on routine safety checks.  Medications given as prescribed.  Support and encouragement offered as needed.    Patient observed socializing with peers in the dayroom.  Offered no complaint.

## 2016-04-05 NOTE — Progress Notes (Signed)
Patient got out of bed and was incontinent of urine. Patient assisted to the bathroom. Bed linens changed. Patient assisted with clothing change. Patient assisted back to bed and is now resting again.

## 2016-04-05 NOTE — Progress Notes (Signed)
[facility] MD Progress Note  04/05/2016 12:20 PM Giordana Weinheimer  MRN:  [id number]   Subjective: Patient stats that she is feeling good this morning.   .    Objective:Patient seen and chart reviewed.Discussed patient with treatment team.  Patient continues to pace up and down hall. Continues to have delusional thoughts "Susa Loffler is my cousin.  Trumps wife is my cousin too." Patient is tolerating medications, eating without difficulty.   d needing redirection.  State that she is tolerating med at this time.      Principal Problem: Schizoaffective disorder, bipolar type (HCC) Diagnosis:   Patient Active Problem List   Diagnosis Date Noted  . Electrolyte imbalance [E87.8] 03/27/2016  . Prolonged Q-T interval on ECG [R94.31] 03/13/2016  . Schizoaffective disorder, bipolar type (HCC) [F25.0] 03/12/2016  . Diabetes mellitus (HCC) [E11.9] 03/12/2016   Total Time spent with patient: 15 minutes  Past Psychiatric History: Please see H&P.   Past Medical History:  Past Medical History:  Diagnosis Date  . Diabetes mellitus without complication [facility])     Past Surgical History:  Procedure Laterality Date  . umbical hernia repair     Family History: Please see H&P.  Family Psychiatric  History: Please see H&P.  Social History: Please see H&P.  History  Alcohol Use No     History  Drug Use No    Social History   Social History  . Marital status: Single    Spouse name: N/A  . Number of children: N/A  . Years of education: N/A   Social History Main Topics  . Smoking status: Current Every Day Smoker    Packs/day: 0.25  . Smokeless tobacco: Never Used  . Alcohol use No  . Drug use: No  . Sexual activity: Not Asked   Other Topics Concern  . None   Social History Narrative  . None   Additional Social History:   Sleep: Fair Improving  Appetite:  Fair, Improving  Current Medications: Current Facility-Administered Medications  Medication Dose Route Frequency  Provider Last Rate Last Dose  . acetaminophen (TYLENOL) tablet 650 mg  650 mg Oral Q6H PRN Kerry Hough, PA-C   650 mg at 04/05/16 1149  . alum & mag hydroxide-simeth (MAALOX/MYLANTA) 200-200-20 MG/5ML suspension 30 mL  30 mL Oral Q4H PRN Kerry Hough, PA-C   30 mL at 03/19/16 0021  . benztropine (COGENTIN) tablet 1 mg  1 mg Oral QHS Jomarie Longs, MD   1 mg at 04/04/16 2000  . divalproex (DEPAKOTE) DR tablet 1,000 mg  1,000 mg Oral QHS Jomarie Longs, MD   1,000 mg at 04/04/16 2000  . divalproex (DEPAKOTE) DR tablet 500 mg  500 mg Oral QPC breakfast Jomarie Longs, MD   500 mg at 04/05/16 4098  . gabapentin (NEURONTIN) capsule 600 mg  600 mg Oral QHS Jomarie Longs, MD   600 mg at 04/04/16 2000  . hydrocerin (EUCERIN) cream   Topical BID Saramma Eappen, MD      . insulin aspart (novoLOG) injection 0-15 Units  0-15 Units Subcutaneous TID WC Jomarie Longs, MD   2 Units at 04/05/16 1211  . insulin aspart (novoLOG) injection 0-5 Units  0-5 Units Subcutaneous QHS Saramma Eappen, MD      . lithium carbonate capsule 150 mg  150 mg Oral BID WC Jomarie Longs, MD   150 mg at 04/05/16 0733  . LORazepam (ATIVAN) tablet 1 mg  1 mg Oral Q6H PRN Jomarie Longs, MD   1  mg at 04/05/16 1610   Or  . LORazepam (ATIVAN) injection 1 mg  1 mg Intramuscular Q6H PRN Jomarie Longs, MD      . magnesium hydroxide (MILK OF MAGNESIA) suspension 30 mL  30 mL Oral Daily PRN Kerry Hough, PA-C      . magnesium oxide (MAG-OX) tablet 400 mg  400 mg Oral Daily Jomarie Longs, MD   400 mg at 04/05/16 0733  . nicotine polacrilex (NICORETTE) gum 2 mg  2 mg Oral PRN Jomarie Longs, MD      . OLANZapine zydis (ZYPREXA) disintegrating tablet 20 mg  20 mg Oral QHS Jomarie Longs, MD   20 mg at 04/04/16 2000  . OLANZapine zydis (ZYPREXA) disintegrating tablet 5 mg  5 mg Oral Daily Jomarie Longs, MD   5 mg at 04/05/16 0734  . OXcarbazepine (TRILEPTAL) tablet 150 mg  150 mg Oral BID Jomarie Longs, MD   150 mg at 04/05/16 0734   . propranolol (INDERAL) tablet 10 mg  10 mg Oral TID Jomarie Longs, MD   10 mg at 04/05/16 1210  . temazepam (RESTORIL) capsule 30 mg  30 mg Oral QHS Jomarie Longs, MD   30 mg at 04/04/16 2000  . traZODone (DESYREL) tablet 25 mg  25 mg Oral QHS Jomarie Longs, MD   25 mg at 04/04/16 2000    Lab Results:  Results for orders placed or performed during the hospital encounter of 03/11/16 (from the past 48 hour(s))  Glucose, capillary     Status: Abnormal   Collection Time: 04/03/16  4:55 PM  Result Value Ref Range   Glucose-Capillary 117 (H) 65 - 99 mg/dL  Glucose, capillary     Status: Abnormal   Collection Time: 04/03/16  8:51 PM  Result Value Ref Range   Glucose-Capillary 145 (H) 65 - 99 mg/dL  Lithium level     Status: Abnormal   Collection Time: 04/04/16  6:09 AM  Result Value Ref Range   Lithium Lvl 0.12 (L) 0.60 - 1.20 mmol/L    Comment: Performed at [facility], [address] [place], [place] [address]  Glucose, capillary     Status: None   Collection Time: 04/04/16  6:18 AM  Result Value Ref Range   Glucose-Capillary 91 65 - 99 mg/dL  Glucose, capillary     Status: None   Collection Time: 04/04/16 11:32 AM  Result Value Ref Range   Glucose-Capillary 97 65 - 99 mg/dL  Glucose, capillary     Status: Abnormal   Collection Time: 04/04/16  4:51 PM  Result Value Ref Range   Glucose-Capillary 103 (H) 65 - 99 mg/dL  Glucose, capillary     Status: Abnormal   Collection Time: 04/04/16  8:41 PM  Result Value Ref Range   Glucose-Capillary 136 (H) 65 - 99 mg/dL  Glucose, capillary     Status: None   Collection Time: 04/05/16  5:50 AM  Result Value Ref Range   Glucose-Capillary 91 65 - 99 mg/dL  Glucose, capillary     Status: Abnormal   Collection Time: 04/05/16 11:09 AM  Result Value Ref Range   Glucose-Capillary 132 (H) 65 - 99 mg/dL    Blood Alcohol level:  Lab Results  Component Value Date   ETH <5 03/11/2016   ETH <5 01/05/2016    Metabolic  Disorder Labs: Lab Results  Component Value Date   HGBA1C 5.2 03/13/2016   MPG 103 03/13/2016   Lab Results  Component Value Date  PROLACTIN 27.9 (H) 03/13/2016   Lab Results  Component Value Date   CHOL 150 03/13/2016   TRIG 69 03/13/2016   HDL 43 03/13/2016   CHOLHDL 3.5 03/13/2016   VLDL 14 03/13/2016   LDLCALC 93 03/13/2016    Physical Findings: AIMS: Facial and Oral Movements Muscles of Facial Expression: None, normal Lips and Perioral Area: None, normal Jaw: None, normal Tongue: None, normal,Extremity Movements Upper (arms, wrists, hands, fingers): None, normal Lower (legs, knees, ankles, toes): None, normal, Trunk Movements Neck, shoulders, hips: None, normal, Overall Severity Severity of abnormal movements (highest score from questions above): None, normal Incapacitation due to abnormal movements: None, normal Patient's awareness of abnormal movements (rate only patient's report): No Awareness, Dental Status Current problems with teeth and/or dentures?: No Does patient usually wear dentures?: No  CIWA:    COWS:     Musculoskeletal: Strength & Muscle Tone: within normal limits Gait & Station: normal Patient leans: N/A  Psychiatric Specialty Exam: Physical Exam  Nursing note and vitals reviewed.   Review of Systems  Psychiatric/Behavioral: The patient is nervous/anxious.   All other systems reviewed and are negative.   Blood pressure 122/75, pulse 73, temperature 98.7 F (37.1 C), temperature source Oral, resp. rate 20, height 5\' 5"  (1.651 m), weight 92.5 kg (204 lb), SpO2 99 %.Body mass index is 33.95 kg/m.  General Appearance: Fairly Groomed  Eye Contact:  Fair  Speech:  Pressured  Volume:  has moments when she is needs redirection for being loud    Mood:  Anxious and tearful at times about her life situation  Affect:  Appropriate  Thought Process:  Linear and Descriptions of Associations: Circumstantial   Orientation:  Full (Time, Place, and  Person)  Thought Content:  Delusions and Rumination Continues delusions comments "I am a detective"   Suicidal Thoughts:  No  Homicidal Thoughts:  No  Memory:  Immediate;   Fair Recent;   Fair Remote;   Poor  Judgement:  Impaired  Insight:  Shallow  Psychomotor Activity:  Restlessness  Concentration:  Concentration: Fair and Attention Span: Fair  Recall:  [employer] of Knowledge:  Fair  Language:  Fair  Akathisia:  No  Handed:  Right  AIMS (if indicated):     Assets:  Desire for Improvement Talents/Skills  ADL's:  Intact  Cognition:  WNL  Sleep:  Number of Hours: 6.5   Schizoaffective disorder, bipolar type (HCC) unstable - slow progress   Treatment Plan Summary: Today 04/05/16 continue with current treatment plan.      Daily contact with patient to assess and evaluate symptoms and progress in treatment and Medication management   For schizoaffective do: reviewed as below - no changes made. Continue Zyprexa 25 mg po daily in divided doses for psychosis/mood sx. Will continue Depakote DR 500 mg po daily and 1000 mg po qhs . Dosing schedule changed to help her sleep better , reduce restlessness at night. Will continue Neurontin to 600 mg po qhs for restlessness/anxiety sx. Will continue cogentin 1 mg po qhs for EPS. Will continue Trileptal 150 mg po bid . Could taper it off, once she is more stable on Li . Li 150 mg po bid to augment the Depakote . Li level tomorrow - 04/04/16. Once she is more stable , will taper off Trileptal.    For insomnia:reviewed as below . Restoril 30 mg po qhs. Trazodone 25 mg po qhs to augment Restoril. (will be cautious since she also has a hx of  EKG prolongation.) ( Patient failed Ambien, Lunesta , doxepin , elavil  Trials)    For restlessness/akathisia: Will increase Propranolol to 10 mg po tid.   For prolonged qtc : Continue EKG monitoring.   Given PPD test  03/19/16 - for ALF placement- - negative .  DM: Will continue current  medication regimen.  Labs reviewed - Mg - 1.8, K+ - 3.9 - Per IM recommendations in the past - patient's Mg  Needs to be >2 , K+ >4 , hence consulted Hospitalist - who recommended starting patient on Mg oxide 400 mg po daily .' Repeat Mg, BMP on Monday - 03/30/16- reviewed labs today - BMP - wnl, Mg- 1.8.Continue Mg oxide as scheduled.  Discontinued 1;1 precaution.  CSW will continue working on disposition. Patient to be referred to ALF /day program. [facility]/also pending ALF placement if she is more stable.Per CSW transitional housing staff to evaluate patient today - pt agrees with the same.   Patient to participate in therapeutic milieu   Makaylia Hewett, NP 04/05/2016, 12:20 PM

## 2016-04-05 NOTE — Progress Notes (Signed)
Pt agitated stating that some stole her money and staff will not help her find it. Pt is lowering her pant telling staff to kiss her ass and suck her dick. Pt redirected to her room, Ativan 1 mg PO given, will continue to monitor.

## 2016-04-06 DIAGNOSIS — R600 Localized edema: Secondary | ICD-10-CM | POA: Clinically undetermined

## 2016-04-06 LAB — GLUCOSE, CAPILLARY
GLUCOSE-CAPILLARY: 92 mg/dL (ref 65–99)
GLUCOSE-CAPILLARY: 107 mg/dL — AB (ref 65–99)
Glucose-Capillary: 110 mg/dL — ABNORMAL HIGH (ref 65–99)
Glucose-Capillary: 129 mg/dL — ABNORMAL HIGH (ref 65–99)

## 2016-04-06 LAB — BASIC METABOLIC PANEL
ANION GAP: 6 (ref 5–15)
BUN: 19 mg/dL (ref 6–20)
CHLORIDE: 102 mmol/L (ref 101–111)
CO2: 31 mmol/L (ref 22–32)
CREATININE: 0.92 mg/dL (ref 0.44–1.00)
Calcium: 8.9 mg/dL (ref 8.9–10.3)
GFR calc non Af Amer: >60 mL/min (ref >60)
GLUCOSE: 133 mg/dL — AB (ref 65–99)
Potassium: 3.6 mmol/L (ref 3.5–5.1)
Sodium: 139 mmol/L (ref 135–145)

## 2016-04-06 LAB — MAGNESIUM: Magnesium: 1.9 mg/dL (ref 1.7–2.4)

## 2016-04-06 NOTE — Progress Notes (Signed)
DAR NOTE: Patient remained hyper verbal, loud and disruptive to the milieu.  Affect and mood remained labile.  Patient continues to be preoccupied with discharge.  Patient verbally aggressive towards when redirected on the unit.  Staff continues to remind and encourage patient to sit and elevate feet due to pedal edema.    Denies auditory and visual hallucinations.  Described energy level as high and concentration as good.  Rates depression at 0, hopelessness at 0, and anxiety at 0.  Maintained on routine safety checks.  Medications given as prescribed.  Support and encouragement offered as needed.   States goal for today is "to go home."  Patient observed socializing with peers in the dayroom.  Ativan 1 mg given for severe agitation with fair effect.

## 2016-04-06 NOTE — Progress Notes (Signed)
Patient BP taken via [employer] by MHT. Patient moving arms and talking loudly. Patient directed to stand still during reading. Patient not receptive to redirection. BP reading 157/100. Patient asymptomatic, reports no pain or issues. Writer attempted to recheck patient's BP manually, but patient refused stating "they already did that". Writer educated patient about the need for rechecking pressure and patient stated "that's not high, I'm fine". Patient became agitated and walked away from Clinical research associatewriter. Will report to next shift and attempt to recheck again.

## 2016-04-06 NOTE — [facility] Group Notes (Signed)
[facility] LCSW Group Therapy  04/06/2016 1:15 pm  Type of Therapy: Process Group Therapy  Participation Level:  Active  Participation Quality:  Appropriate  Affect:  Flat  Cognitive:  Oriented  Insight:  Improving  Engagement in Group:  Limited  Engagement in Therapy:  Limited  Modes of Intervention:  Activity, Clarification, Education, Problem-solving and Support  Summary of Progress/Problems: Today's group addressed the issue of overcoming obstacles.  Patients were asked to identify their biggest obstacle post d/c that stands in the way of their on-going success, and then problem solve as to how to manage this. Was not invited as she has been loud and intrusive today.  [name], Catherine Bender B 04/06/2016   2:36 PM

## 2016-04-06 NOTE — Progress Notes (Signed)
Patient is awake and at the nurses station. Patient states her goal for today is to "to go home" and she is "tired of being here". Patient states "I am ready to go home with all of my money".

## 2016-04-06 NOTE — Progress Notes (Signed)
[facility] MD Progress Note  04/06/2016 12:59 PM Marsha Gundlach  MRN:  [id number]   Subjective: Pt states " I am fine, My uncle will take me back.'   .    Objective:Patient seen and chart reviewed.Discussed patient with treatment team.  Pt today seen as restless in the hallways, continues to be hyperverbal. Pt also makes on and off delusional comments like "Obama is my cousin and I need to call Trump.' Pt per RN , continues to need redirection on the unit. Pt per RN also with pedal edema , she is on Li also - will get BMP .      Principal Problem: Schizoaffective disorder, bipolar type (HCC) Diagnosis:   Patient Active Problem List   Diagnosis Date Noted  . Pedal edema [R60.0] 04/06/2016  . Electrolyte imbalance [E87.8] 03/27/2016  . Prolonged Q-T interval on ECG [R94.31] 03/13/2016  . Schizoaffective disorder, bipolar type (HCC) [F25.0] 03/12/2016  . Diabetes mellitus (HCC) [E11.9] 03/12/2016   Total Time spent with patient: 20 minutes  Past Psychiatric History: Please see H&P.   Past Medical History:  Past Medical History:  Diagnosis Date  . Diabetes mellitus without complication [facility])     Past Surgical History:  Procedure Laterality Date  . umbical hernia repair     Family History: Please see H&P.  Family Psychiatric  History: Please see H&P.  Social History: Please see H&P.  History  Alcohol Use No     History  Drug Use No    Social History   Social History  . Marital status: Single    Spouse name: N/A  . Number of children: N/A  . Years of education: N/A   Social History Main Topics  . Smoking status: Current Every Day Smoker    Packs/day: 0.25  . Smokeless tobacco: Never Used  . Alcohol use No  . Drug use: No  . Sexual activity: Not Asked   Other Topics Concern  . None   Social History Narrative  . None   Additional Social History:   Sleep: Fair Improving  Appetite:  Fair, Improving  Current Medications: Current Facility-Administered  Medications  Medication Dose Route Frequency Provider Last Rate Last Dose  . acetaminophen (TYLENOL) tablet 650 mg  650 mg Oral Q6H PRN Kerry Hough, PA-C   650 mg at 04/05/16 1149  . alum & mag hydroxide-simeth (MAALOX/MYLANTA) 200-200-20 MG/5ML suspension 30 mL  30 mL Oral Q4H PRN Kerry Hough, PA-C   30 mL at 03/19/16 0021  . benztropine (COGENTIN) tablet 1 mg  1 mg Oral QHS Jomarie Longs, MD   1 mg at 04/05/16 2039  . divalproex (DEPAKOTE) DR tablet 1,000 mg  1,000 mg Oral QHS Jomarie Longs, MD   1,000 mg at 04/04/16 2000  . divalproex (DEPAKOTE) DR tablet 500 mg  500 mg Oral QPC breakfast Jomarie Longs, MD   500 mg at 04/06/16 0740  . gabapentin (NEURONTIN) capsule 600 mg  600 mg Oral QHS Aza Dantes, MD   600 mg at 04/05/16 2036  . hydrocerin (EUCERIN) cream   Topical BID Sadi Arave, MD      . insulin aspart (novoLOG) injection 0-15 Units  0-15 Units Subcutaneous TID WC Jomarie Longs, MD   2 Units at 04/05/16 1211  . insulin aspart (novoLOG) injection 0-5 Units  0-5 Units Subcutaneous QHS Caterina Racine, MD      . lithium carbonate capsule 150 mg  150 mg Oral BID WC Dontae Minerva, MD   150 mg  at 04/06/16 0740  . LORazepam (ATIVAN) tablet 1 mg  1 mg Oral Q6H PRN Jomarie Longs, MD   1 mg at 04/06/16 1256   Or  . LORazepam (ATIVAN) injection 1 mg  1 mg Intramuscular Q6H PRN Jomarie Longs, MD      . magnesium hydroxide (MILK OF MAGNESIA) suspension 30 mL  30 mL Oral Daily PRN Kerry Hough, PA-C      . magnesium oxide (MAG-OX) tablet 400 mg  400 mg Oral Daily Rasaan Brotherton, MD   400 mg at 04/06/16 0740  . nicotine polacrilex (NICORETTE) gum 2 mg  2 mg Oral PRN Jomarie Longs, MD      . OLANZapine zydis (ZYPREXA) disintegrating tablet 20 mg  20 mg Oral QHS Jomarie Longs, MD   20 mg at 04/05/16 2039  . OLANZapine zydis (ZYPREXA) disintegrating tablet 5 mg  5 mg Oral Daily Jomarie Longs, MD   5 mg at 04/06/16 0742  . OXcarbazepine (TRILEPTAL) tablet 150 mg  150 mg Oral BID  Jomarie Longs, MD   150 mg at 04/06/16 0800  . propranolol (INDERAL) tablet 10 mg  10 mg Oral TID Jomarie Longs, MD   10 mg at 04/06/16 1205  . temazepam (RESTORIL) capsule 30 mg  30 mg Oral QHS Jomarie Longs, MD   30 mg at 04/05/16 2036  . traZODone (DESYREL) tablet 25 mg  25 mg Oral QHS Jomarie Longs, MD   25 mg at 04/05/16 2039    Lab Results:  Results for orders placed or performed during the hospital encounter of 03/11/16 (from the past 48 hour(s))  Glucose, capillary     Status: Abnormal   Collection Time: 04/04/16  4:51 PM  Result Value Ref Range   Glucose-Capillary 103 (H) 65 - 99 mg/dL  Glucose, capillary     Status: Abnormal   Collection Time: 04/04/16  8:41 PM  Result Value Ref Range   Glucose-Capillary 136 (H) 65 - 99 mg/dL  Glucose, capillary     Status: None   Collection Time: 04/05/16  5:50 AM  Result Value Ref Range   Glucose-Capillary 91 65 - 99 mg/dL  Glucose, capillary     Status: Abnormal   Collection Time: 04/05/16 11:09 AM  Result Value Ref Range   Glucose-Capillary 132 (H) 65 - 99 mg/dL  Glucose, capillary     Status: Abnormal   Collection Time: 04/05/16  4:33 PM  Result Value Ref Range   Glucose-Capillary 106 (H) 65 - 99 mg/dL  Glucose, capillary     Status: Abnormal   Collection Time: 04/05/16  7:52 PM  Result Value Ref Range   Glucose-Capillary 120 (H) 65 - 99 mg/dL  Glucose, capillary     Status: None   Collection Time: 04/06/16  5:54 AM  Result Value Ref Range   Glucose-Capillary 92 65 - 99 mg/dL  Glucose, capillary     Status: Abnormal   Collection Time: 04/06/16 11:46 AM  Result Value Ref Range   Glucose-Capillary 110 (H) 65 - 99 mg/dL    Blood Alcohol level:  Lab Results  Component Value Date   ETH <5 03/11/2016   ETH <5 01/05/2016    Metabolic Disorder Labs: Lab Results  Component Value Date   HGBA1C 5.2 03/13/2016   MPG 103 03/13/2016   Lab Results  Component Value Date   PROLACTIN 27.9 (H) 03/13/2016   Lab Results   Component Value Date   CHOL 150 03/13/2016   TRIG 69 03/13/2016  HDL 43 03/13/2016   CHOLHDL 3.5 03/13/2016   VLDL 14 03/13/2016   LDLCALC 93 03/13/2016    Physical Findings: AIMS: Facial and Oral Movements Muscles of Facial Expression: None, normal Lips and Perioral Area: None, normal Jaw: None, normal Tongue: None, normal,Extremity Movements Upper (arms, wrists, hands, fingers): None, normal Lower (legs, knees, ankles, toes): None, normal, Trunk Movements Neck, shoulders, hips: None, normal, Overall Severity Severity of abnormal movements (highest score from questions above): None, normal Incapacitation due to abnormal movements: None, normal Patient's awareness of abnormal movements (rate only patient's report): No Awareness, Dental Status Current problems with teeth and/or dentures?: No Does patient usually wear dentures?: No  CIWA:    COWS:     Musculoskeletal: Strength & Muscle Tone: within normal limits Gait & Station: normal Patient leans: N/A  Psychiatric Specialty Exam: Physical Exam  Nursing note and vitals reviewed.   Review of Systems  Psychiatric/Behavioral: The patient is nervous/anxious.   All other systems reviewed and are negative.   Blood pressure (!) 145/95, pulse 79, temperature 98 F (36.7 C), temperature source Oral, resp. rate 18, height 5\' 5"  (1.651 m), weight 92.5 kg (204 lb), SpO2 99 %.Body mass index is 33.95 kg/m.  General Appearance: Fairly Groomed  Eye Contact:  Fair  Speech:  Pressured  Volume:  os loud off and on   Mood:  Angry and about being in the hospital, is anxious at times   Affect:  continues to have on and off lability  Thought Process:  Linear and Descriptions of Associations: Circumstantial   Orientation:  Full (Time, Place, and Person)  Thought Content:  Delusions and Rumination makes random irrelevant comments like " Susa LofflerBarack obama is my cousin.'  Suicidal Thoughts:  No  Homicidal Thoughts:  No  Memory:  Immediate;    Fair Recent;   Fair Remote;   Poor  Judgement:  Impaired  Insight:  Shallow  Psychomotor Activity:  Restlessness  Concentration:  Concentration: Fair and Attention Span: Fair  Recall:  FiservFair  Fund of Knowledge:  Fair  Language:  Fair  Akathisia:  No  Handed:  Right  AIMS (if indicated):     Assets:  Desire for Improvement Talents/Skills  ADL's:  Intact  Cognition:  WNL  Sleep:  Number of Hours: 6    Schizoaffective disorder, bipolar type (HCC) unstable   Will continue today 04/06/16  plan as below except where it is noted.   Daily contact with patient to assess and evaluate symptoms and progress in treatment and Medication management   For schizoaffective do: reviewed - no changes made. Continue Zyprexa 25 mg po daily in divided doses for psychosis/mood sx. Will continue Depakote DR 500 mg po daily and 1000 mg po qhs . Dosing schedule changed to help her sleep better , reduce restlessness at night. Will continue Neurontin to 600 mg po qhs for restlessness/anxiety sx. Will continue cogentin 1 mg po qhs for EPS. Will continue Trileptal 150 mg po bid . Could taper it off, once she is more stable on Li . Continue Li 150 mg po bid to augment Depakote. Will get BMP since pt is on LI , as well has pedal edema. Li level - 04/04/16 - Results for Delorse LimberHADLEY, Mallisa (MRN [id number]) as of 04/06/2016 12:49  Ref. Range 04/04/2016 06:09  Lithium Latest Ref Range: 0.60 - 1.20 mmol/L 0.12 (L)      For insomnia:reviewed as below . Restoril 30 mg po qhs. Trazodone 25 mg po qhs to augment Restoril. (  will be cautious since she also has a hx of EKG prolongation.) ( Patient failed Ambien, Lunesta , doxepin , elavil  Trials)    For restlessness/akathisia: Will increase Propranolol to 10 mg po tid.  For pedal edema: Get BMP, repeat MG. Elevate leg. Will continue to monitor.   For prolonged qtc : Continue EKG monitoring.   Given PPD test  03/19/16 - for ALF placement- - negative  .  DM: Will continue current medication regimen.  Labs reviewed - Mg - 1.8, K+ - 3.9 - Per IM recommendations in the past - patient's Mg  Needs to be >2 , K+ >4 , hence consulted Hospitalist - who recommended starting patient on Mg oxide 400 mg po daily .' Repeat Mg, BMP on Monday - 03/30/16- reviewed labs today - BMP - wnl, Mg- 1.8.Continue Mg oxide as scheduled.  Discontinued 1;1 precaution.  CSW will continue working on disposition. Patient to be referred to ALF /day program. Pt referred to [facility] pending ALF placement if she is more stable.Per CSW transitional housing staff to evaluate patient today - pt agrees with the same.   Patient to participate in therapeutic milieu   Zerline Melchior, MD 04/06/2016, 12:59 PM

## 2016-04-06 NOTE — Progress Notes (Signed)
Recreation Therapy Notes  Date: 04/06/16 Time: 1000 Location: 500 Hall Dayroom  Group Topic: Anger Management  Goal Area(s) Addresses:  Patient will identify triggers for anger.  Patient will identify physical reaction to anger.   Patient will identify benefit of using coping skills when angry.  Behavioral Response: Minimal  Intervention: Anger umbrella worksheet, pencils  Activity:  Anger Umbrella.  Patients were given a worksheet with an umbrella on it.  Patients were to fill in the umbrella with the emotions that cause anger or situations that cause them to get angry.  Once patients identified the emotions and situations that cause anger, they were to identify coping skills and write them along the outside of the umbrella.    Education: Anger Management, Discharge Planning   Education Outcome: Acknowledges education/In group clarification offered/Needs additional education.   Clinical Observations/Feedback: Pt was engaged at first but was easily distracted.  Pt needed redirection from talking over her peers and laughing out loud.  Pt did identify some of the things that get her angry as not feeling loved and feeling alone.  Pt left early and did not return.   Catherine RancherMarjette Bronsen Bender, LRT/CTRS       Catherine RancherLindsay, Desarai Barrack A 04/06/2016 12:29 PM

## 2016-04-06 NOTE — Progress Notes (Signed)
MHT reported to nurse patient was up and showering, now requesting for assistance getting dressed. Patient stated she was taking a shower because she "peed herself". Patient very lethargic while providing assistance. Writer and RN Erskine SquibbJane assisted patient with clothing change and assisted patient back into bed. Patient's socks changed by Clinical research associatewriter. One dollar found in patient's sock. Two dollars found in discarded scrub top. Money placed in paper bag and placed at patient bedside. Patient currently sleeping in bed. Will continue to monitor.

## 2016-04-06 NOTE — Progress Notes (Signed)
Did bnot attend group

## 2016-04-06 NOTE — Tx Team (Signed)
Interdisciplinary Treatment and Diagnostic Plan Update  04/06/2016 Time of Session: 10:14 AM  Catherine LimberRenitha Bender MRN: [id number]  Principal Diagnosis: Schizoaffective disorder, bipolar type (HCC)  Secondary Diagnoses: Principal Problem:   Schizoaffective disorder, bipolar type (HCC) Active Problems:   Prolonged Q-T interval on ECG   Electrolyte imbalance   Current Medications:  Current Facility-Administered Medications  Medication Dose Route Frequency Provider Last Rate Last Dose  . acetaminophen (TYLENOL) tablet 650 mg  650 mg Oral Q6H PRN Kerry HoughSpencer E Simon, PA-C   650 mg at 04/05/16 1149  . alum & mag hydroxide-simeth (MAALOX/MYLANTA) 200-200-20 MG/5ML suspension 30 mL  30 mL Oral Q4H PRN Kerry HoughSpencer E Simon, PA-C   30 mL at 03/19/16 0021  . benztropine (COGENTIN) tablet 1 mg  1 mg Oral QHS Jomarie LongsSaramma Eappen, MD   1 mg at 04/05/16 2039  . divalproex (DEPAKOTE) DR tablet 1,000 mg  1,000 mg Oral QHS Jomarie LongsSaramma Eappen, MD   1,000 mg at 04/04/16 2000  . divalproex (DEPAKOTE) DR tablet 500 mg  500 mg Oral QPC breakfast Jomarie LongsSaramma Eappen, MD   500 mg at 04/06/16 0740  . gabapentin (NEURONTIN) capsule 600 mg  600 mg Oral QHS Saramma Eappen, MD   600 mg at 04/05/16 2036  . hydrocerin (EUCERIN) cream   Topical BID Saramma Eappen, MD      . insulin aspart (novoLOG) injection 0-15 Units  0-15 Units Subcutaneous TID WC Jomarie LongsSaramma Eappen, MD   2 Units at 04/05/16 1211  . insulin aspart (novoLOG) injection 0-5 Units  0-5 Units Subcutaneous QHS Saramma Eappen, MD      . lithium carbonate capsule 150 mg  150 mg Oral BID WC Jomarie LongsSaramma Eappen, MD   150 mg at 04/06/16 0740  . LORazepam (ATIVAN) tablet 1 mg  1 mg Oral Q6H PRN Jomarie LongsSaramma Eappen, MD   1 mg at 04/05/16 1829   Or  . LORazepam (ATIVAN) injection 1 mg  1 mg Intramuscular Q6H PRN Jomarie LongsSaramma Eappen, MD      . magnesium hydroxide (MILK OF MAGNESIA) suspension 30 mL  30 mL Oral Daily PRN Kerry HoughSpencer E Simon, PA-C      . magnesium oxide (MAG-OX) tablet 400 mg  400 mg Oral Daily  Saramma Eappen, MD   400 mg at 04/06/16 0740  . nicotine polacrilex (NICORETTE) gum 2 mg  2 mg Oral PRN Jomarie LongsSaramma Eappen, MD      . OLANZapine zydis (ZYPREXA) disintegrating tablet 20 mg  20 mg Oral QHS Jomarie LongsSaramma Eappen, MD   20 mg at 04/05/16 2039  . OLANZapine zydis (ZYPREXA) disintegrating tablet 5 mg  5 mg Oral Daily Jomarie LongsSaramma Eappen, MD   5 mg at 04/06/16 0742  . OXcarbazepine (TRILEPTAL) tablet 150 mg  150 mg Oral BID Jomarie LongsSaramma Eappen, MD   150 mg at 04/06/16 0800  . propranolol (INDERAL) tablet 10 mg  10 mg Oral TID Jomarie LongsSaramma Eappen, MD   10 mg at 04/06/16 0740  . temazepam (RESTORIL) capsule 30 mg  30 mg Oral QHS Jomarie LongsSaramma Eappen, MD   30 mg at 04/05/16 2036  . traZODone (DESYREL) tablet 25 mg  25 mg Oral QHS Jomarie LongsSaramma Eappen, MD   25 mg at 04/05/16 2039    PTA Medications: Prescriptions Prior to Admission  Medication Sig Dispense Refill Last Dose  . LITHIUM PO Take 1 tablet by mouth once.   Not Taking at Unknown time    Treatment Modalities: Medication Management, Group therapy, Case management,  1 to 1 session with clinician, Psychoeducation, Recreational therapy.  Physician Treatment Plan for Primary Diagnosis: Schizoaffective disorder, bipolar type (HCC) Long Term Goal(s): Improvement in symptoms so as ready for discharge  Short Term Goals: Compliance with prescribed medications will improve  Medication Management: Evaluate patient's response, side effects, and tolerance of medication regimen.  Therapeutic Interventions: 1 to 1 sessions, Unit Group sessions and Medication administration.  Evaluation of Outcomes: Progressing   2/15:  Will increase  Zyprexa to 20  mg po daily in divided doses. Dose adjustment to be done cautiously due to h/o QTC prolongation as well as her inability to walk , drowsiness. ( per daughter - at baseline she takes care of self and is better functioning - is not wheelchair bound) . Repeat EKG ( 03/17/16)- shows qtc- improving - however likely due to dose  reduction of offending agent. EKG  ( 03/19/16)- qtc wnl. EKG - 03/25/16 - qtc - wnl . Marland Kitchen  Cogentin 0.5 mg po qhs for EPS. Will continue Depakote DR 500 mg po tid for mood sx. Depakote level on 03/18/16- 83 ( therapeutic)  Will continue Neurontin 400 mg po qhs for anxiety/restlessness. Will increase Trilpetal to 300 mg po bid - initiated yesterday at 150 mg po bid . She will receive an increase of 300 mg today and 300 mg bid tomorrow.  For insomnia: Discontinue Ambien due to lack of efficacy . Discontinue  Lunesta due to lack of efficacy , failed ambien trial , due to qtc prolongation - cannot be on some of the other sleep aids like doxepin, elavil , failed trazodone . Will increase Restoril to 30  mg po qhs today.  For hx of prolonged QTC: Repeat EKG showed qtc improving - will continue to monitor.Repeat EKG - 03/19/16- wnl .  03/23/16 - qt - 416 , qtc - 470 - will continue to monitor.Repeat EKG today- 03/25/16- qtc - wnl.  2/21:  For schizoaffective NF:[id number] as below - no changes made today Continue Zyprexa 25 mg po daily in divided doses for psychosis/mood sx. Depakote to be changed to Depakote DR 500 mg po daily and 1000 mg po qhs . Dosing schedule changed to help her sleep better , reduce restlessness at night. Increase Neurontin to 600 mg po qhs for restlessness/anxiety sx. Will continue cogentin 1 mg po qhs for EPS. Will continue Trilpetal 150 mg po bid . Could taper it off, once she is more stable on Li . Will continue Li 150 mg po bid - to augment her depakote . Li level on 04/04/16.    Insomnia:reviewed - no changes made Will continue Restoril 30 mg po qhs - she slept few nights on the same , but last night was restless again. Add trazodone 25 mg po qhs to augment the restoril , will be cautious since she also has a hx of EKG prolongation. ( Patient failed Vernon Prey , doxepin , elavil  Trials)   2/26: Continue Zyprexa 25 mg po daily in divided doses for psychosis/mood  sx. Will continue Depakote DR 500 mg po daily and 1000 mg po qhs . Dosing schedule changed to help her sleep better , reduce restlessness at night. Will continue Neurontin to 600 mg po qhs for restlessness/anxiety sx. Will continue cogentin 1 mg po qhs for EPS. Will continue Trileptal 150 mg po bid . Could taper it off, once she is more stable on Li . Continue Li 150 mg po bid to augment Depakote. Will get BMP since pt is on LI , as well has pedal edema.  Physician Treatment Plan for Secondary Diagnosis: Principal Problem:   Schizoaffective disorder, bipolar type (HCC) Active Problems:   Prolonged Q-T interval on ECG   Electrolyte imbalance   Long Term Goal(s): Improvement in symptoms so as ready for discharge  Short Term Goals: Ability to demonstrate self-control will improve  Medication Management: Evaluate patient's response, side effects, and tolerance of medication regimen.  Therapeutic Interventions: 1 to 1 sessions, Unit Group sessions and Medication administration.  Evaluation of Outcomes: Progressing   RN Treatment Plan for Primary Diagnosis: Schizoaffective disorder, bipolar type (HCC) Long Term Goal(s): Knowledge of disease and therapeutic regimen to maintain health will improve  Short Term Goals: Ability to demonstrate self-control and Compliance with prescribed medications will improve  Medication Management: RN will administer medications as ordered by provider, will assess and evaluate patient's response and provide education to patient for prescribed medication. RN will report any adverse and/or side effects to prescribing provider.  Therapeutic Interventions: 1 on 1 counseling sessions, Psychoeducation, Medication administration, Evaluate responses to treatment, Monitor vital signs and CBGs as ordered, Perform/monitor CIWA, COWS, AIMS and Fall Risk screenings as ordered, Perform wound care treatments as ordered.  Evaluation of Outcomes: Progressing   LCSW  Treatment Plan for Primary Diagnosis: Schizoaffective disorder, bipolar type (HCC) Long Term Goal(s): Safe transition to appropriate next level of care at discharge, Engage patient in therapeutic group addressing interpersonal concerns.  Short Term Goals: Engage patient in aftercare planning with referrals and resources and Increase skills for wellness and recovery  Therapeutic Interventions: Assess for all discharge needs, 1 to 1 time with Social worker, Explore available resources and support systems, Assess for adequacy in community support network, Educate family and significant other(s) on suicide prevention, Complete Psychosocial Assessment, Interpersonal group therapy.  Evaluation of Outcomes: Progressing   Progress in Treatment: Attending groups: Intermittently Participating in groups: Not meaningfully Taking medication as prescribed: Yes, MD continues to assess for medication changes as needed Toleration medication: Yes, no side effects reported at this time Family/Significant other contact made: Yes, Ms. Delene Loll (daughter, [phone]) Patient understands diagnosis: No, limited insight Discussing patient identified problems/goals with staff: No Medical problems stabilized or resolved: Yes, patient now up and walking out of wheelchair Denies suicidal/homicidal ideation: Yes Issues/concerns per patient self-inventory: None Other: N/A  New problem(s) identified: discharge planning, currently homeless  New Short Term/Long Term Goal(s): Identify a safe discharge plan involving housing.    Discharge Plan or Barriers: Unknown at this time, CSW will continue to follow and assess for options regarding placement  2/12: PASSR evaluation pending at this time; CSW to facilitate search for placement. MD requesting [facility] referral be made 2/15:  PASSR evaluation on hold; pt not stabilized to the point of readiness for transfer.  Confirmation of [facility] wait list as of 2/14.  CSW attempting to  contact mother to involve in dispositional planning 2/21:  Mother is willing to have patient there, and said she would contact pt's son in [place] bout bringing her to [place].  However, pt has been heard multiple times on phone telling mother whe will not come there and is staying locally.  Pt is refusing referral to ALF.  CSW has contacted [facility] about transitional housing-will visit pt on Friday 2/26:  Ms Earlene Plater interested, but said to call back when pt is less symptomatic.  Will arrange meeting with Ms Earlene Plater, along with daughter Karma Greaser, when Dr feels pt has improved to the extent of re-evaluation.  In the meantime, Marcelle Smiling states she will  reach out to her mother's previous landlord as he has not responded to my attempts to contact him.  Reason for Continuation of Hospitalization: Anxiety Delusions  Medication stabilization Disorganization    Estimated Length of Stay: 3-5 days  Attendees: Patient:  04/06/2016  10:14 AM  Physician: Dr. Elna Breslow 04/06/2016  10:14 AM  Nursing: Esperanza Sheets, RN  04/06/2016  10:14 AM  RN Care Manager: Onnie Boer, CM RN 04/06/2016  10:14 AM  Social Worker: Richelle Ito LCSW 04/06/2016  10:14 AM  Recreational Therapist: Royston Cowper, RT 04/06/2016  10:14 AM  Other:  04/06/2016  10:14 AM  Other:  04/06/2016  10:14 AM  Other: 04/06/2016  10:14 AM    Scribe for Treatment Team: Richelle Ito, LCSW Clinical Social Work [phone]

## 2016-04-07 LAB — GLUCOSE, CAPILLARY
GLUCOSE-CAPILLARY: 85 mg/dL (ref 65–99)
GLUCOSE-CAPILLARY: 95 mg/dL (ref 65–99)
GLUCOSE-CAPILLARY: 91 mg/dL (ref 65–99)
Glucose-Capillary: 101 mg/dL — ABNORMAL HIGH (ref 65–99)

## 2016-04-07 LAB — HEPATIC FUNCTION PANEL
ALBUMIN: 3.5 g/dL (ref 3.5–5.0)
ALK PHOS: 59 U/L (ref 38–126)
ALT: 14 U/L (ref 14–54)
AST: 19 U/L (ref 15–41)
Bilirubin, Direct: <0.1 mg/dL — ABNORMAL LOW (ref 0.1–0.5)
TOTAL PROTEIN: 7.1 g/dL (ref 6.5–8.1)
Total Bilirubin: 0.3 mg/dL (ref 0.3–1.2)

## 2016-04-07 LAB — BRAIN NATRIURETIC PEPTIDE: B NATRIURETIC PEPTIDE 5: 83.7 pg/mL (ref 0.0–100.0)

## 2016-04-07 MED ORDER — POTASSIUM CHLORIDE CRYS ER 20 MEQ PO TBCR
40.0000 meq | EXTENDED_RELEASE_TABLET | Freq: Once | ORAL | Status: AC
Start: 2016-04-07 — End: 2016-04-07
  Administered 2016-04-07: 40 meq via ORAL
  Filled 2016-04-07 (×2): qty 2

## 2016-04-07 MED ORDER — NICOTINE POLACRILEX 2 MG MT GUM: 2.0000 mg | CHEWING_GUM | OROMUCOSAL | Status: DC | PRN

## 2016-04-07 MED ORDER — LITHIUM CARBONATE 300 MG PO CAPS
300.0000 mg | ORAL_CAPSULE | Freq: Two times a day (BID) | ORAL | Status: DC
  Administered 2016-04-07 – 2016-04-14 (×14): 300 mg via ORAL
  Filled 2016-04-07 (×16): qty 1

## 2016-04-07 MED ORDER — MAGNESIUM OXIDE 400 (241.3 MG) MG PO TABS
800.0000 mg | ORAL_TABLET | ORAL | Status: AC
  Administered 2016-04-07: 800 mg via ORAL
  Filled 2016-04-07: qty 2

## 2016-04-07 NOTE — Progress Notes (Signed)
At the beginning of the shift, pt was agitated and cursing about something that had happened prior to the shift change.  After staff got out of report, evening shift nurses spent a few minutes talking with pt and working to establish a rapport with the patient.  She eventually calmed down, but remained loud and boisterous.  Pt denies SI/HI/AVH.  She makes her needs known to staff. She has been cooperative with staff thus far in the shift.   Nursing decided that it was in the best interest of the patient that her hs meds be given about 2030 as pt would take them.  Pt took her Depakote with an Ensure that she requested around 2015, then when she came out of the dayroom around 2040 stating that she was sleepy, pt was given the remainder of her hs meds.  She went to her room and went to bed without any incident.  Support and encouragement offered.  Discharge plans are in process.  Safety maintained with q15 minute checks.

## 2016-04-07 NOTE — Progress Notes (Signed)
Patient refused EKG this afternoon.

## 2016-04-07 NOTE — Progress Notes (Signed)
Adult Psychoeducational Group Note  Date:  04/07/2016 Time:  8:52 PM  Group Topic/Focus:  Wrap-Up Group:   The focus of this group is to help patients review their daily goal of treatment and discuss progress on daily workbooks.  Participation Level:  Active  Participation Quality:  Appropriate  Affect:  Appropriate  Cognitive:  Appropriate  Insight: Appropriate  Engagement in Group:  Engaged  Modes of Intervention:  Discussion  Additional Comments:  Patient attended group and said that her day was a 5. 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   Raiford Fetterman W Evaline Waltman 04/07/2016, 8:52 PM

## 2016-04-07 NOTE — Progress Notes (Signed)
Recreation Therapy Notes  Date: 04/07/16 Time: 1000 Location: 500 Hall Dayroom  Group Topic: Leisure Education  Goal Area(s) Addresses:  Patient will identify positive leisure activities.  Patient will identify one positive benefit of participation in leisure activities.  Patient will identify how using leisure time effectively will benefit them post d/c.  Behavioral Response: Minimal  Intervention: Construction paper, scissors, markers, glue sticks, magazines  Activity: Leisure PSA.  Patients were to create a public service announcement to advertise leisure.  Patients were to include a definition of leisure, who benefits from leisure and give examples of activities they can do outdoors, indoors and by themselves.  Education:  Leisure Education, Building control surveyorDischarge Planning  Education Outcome: Acknowledges education/In group clarification offered/Needs additional education  Clinical Observations/Feedback: Pt sat and sang along with some of the music that was playing while coloring a picture during the session.  Pt left early and did not return.   Caroll RancherMarjette Rolin Schult, LRT/CTRS     Caroll RancherLindsay, Dorothe Elmore A 04/07/2016 11:43 AM

## 2016-04-07 NOTE — Progress Notes (Signed)
Called to follow up on patient.  It had been previously recommended by one of my colleagues that given prolonged QTC seen on EKG, magnesium should be kept greater than 2.0  and potassium greater than 4.0. In addition, patient has a history of pedal edema which appears to be worsening.  1. Magnesium level at 1.9. Patient receives mag oxide 400 mg daily. Have ordered an additional 800 mg. During hospitalization, if patient's magnesium level was found to be below 2, give additional 400 mg of magnesium.  2. Patient's potassium at 3.6. Ordered K Dur 40 mEq. During hospitalization, patient's potassium level is below 4, give additional 40 mEq for every 0.4 below 4.0.  3. Pedal edema. Reviewed the patient's x-ray. She does have cardiomegaly. Have ordered a BNP which will not be done until tonight to ensure that she does not have heart failure. Have also ordered liver function tests to look at her albumin. In review of her records, her dietary intake at times is inconsistent. -If BNP normal but albumin low, she is third spacing and her edema will improve once her nutrition is better. -If BNP significantly elevated, will order echocardiogram, to evaluate for heart failure. -If BNP and albumin both normal, then this is unremarkable I would just start low-dose diuretic like HCTZ.  We'll follow-up on lab work tomorrow to determine decision-making for #3.

## 2016-04-07 NOTE — Progress Notes (Signed)
Patient threw breakfast plate in hallway floor this morning.  Patient was verbally aggressive and called staff trailer trash and bitches and threatened to hurt staff.  Patient has been on phone today talking ugly, cursing family members and friends, and threatened family member.    Patient's self inventory sheet, patient sleeps good, no sleep medication given.  Good appetite, normal energy, good concentration.  Denied depression, hopeless and anxiety.  Denied withdrawals.  Denied SI.  Physical problems, swelling in feet.  Goal is to get on track with finances and trust people.  Plans to talk to SW.  "I love the staff."  No discharge plans.  Staff has continually tried to calm patient throughout the day, 2 nurses and 2 MHT's, [facility] to [facility] patient.   Patient has screams and yells at staff and will not follow redirection at times.

## 2016-04-07 NOTE — [facility] Group Notes (Signed)
[facility] LCSW Group Therapy  04/07/2016 , 1:21 PM   Type of Therapy:  Group Therapy  Participation Level:  Active  Participation Quality:  Attentive  Affect:  Appropriate  Cognitive:  Alert  Insight:  Improving  Engagement in Therapy:  Engaged  Modes of Intervention:  Discussion, Exploration and Socialization  Summary of Progress/Problems: Today's group focused on the term Diagnosis.  Participants were asked to define the term, and then pronounce whether it is a negative, positive or neutral term. In the hall making a ruckus prior to group.  Not invited.  Catherine Bender, Catherine Bender B 04/07/2016 , 1:21 PM

## 2016-04-07 NOTE — Progress Notes (Addendum)
[facility] MD Progress Note  04/07/2016 2:25 PM Catherine Bender  MRN:  [id number]   Subjective: Pt states " I am working on my rent. I am ready to go home.'    .    Objective:Patient seen and chart reviewed.Discussed patient with treatment team.  Pt observed as pressured , loud on the unit often. Pt also with behavioral issues - is unhappy about the meals she gets here and also wants to go to cafeteria to get her meals. Pt has to be restricted to the unit at times due to her being intrusive and impulsive. Pt has been compliant with medications . She sleeps better now. Pt with worsening pedal edema- hospitalist consult was made- please see consult note. Continue to encourage and support.       Principal Problem: Schizoaffective disorder, bipolar type ([place]) Diagnosis:   Patient Active Problem List   Diagnosis Date Noted  . Pedal edema [R60.0] 04/06/2016  . Electrolyte imbalance [E87.8] 03/27/2016  . Prolonged Q-T interval on ECG [R94.31] 03/13/2016  . Schizoaffective disorder, bipolar type ([place]) [F25.0] 03/12/2016  . Diabetes mellitus ([place]) [E11.9] 03/12/2016   Total Time spent with patient: 20 minutes  Past Psychiatric History: Please see H&P.   Past Medical History:  Past Medical History:  Diagnosis Date  . Diabetes mellitus without complication [facility])     Past Surgical History:  Procedure Laterality Date  . umbical hernia repair     Family History: Please see H&P.  Family Psychiatric  History: Please see H&P.  Social History: Please see H&P.  History  Alcohol Use No     History  Drug Use No    Social History   Social History  . Marital status: Single    Spouse name: N/A  . Number of children: N/A  . Years of education: N/A   Social History Main Topics  . Smoking status: Current Every Day Smoker    Packs/day: 0.25  . Smokeless tobacco: Never Used  . Alcohol use No  . Drug use: No  . Sexual activity: Not Asked   Other Topics Concern  . None   Social  History Narrative  . None   Additional Social History:   Sleep: Fair Improving  Appetite:  Fair, Improving  Current Medications: Current Facility-Administered Medications  Medication Dose Route Frequency Provider Last Rate Last Dose  . acetaminophen (TYLENOL) tablet 650 mg  650 mg Oral Q6H PRN Laverle Hobby, PA-C   650 mg at 04/06/16 1845  . alum & mag hydroxide-simeth (MAALOX/MYLANTA) 200-200-20 MG/5ML suspension 30 mL  30 mL Oral Q4H PRN Laverle Hobby, PA-C   30 mL at 03/19/16 0021  . benztropine (COGENTIN) tablet 1 mg  1 mg Oral QHS Ursula Alert, MD   1 mg at 04/06/16 2041  . divalproex (DEPAKOTE) DR tablet 1,000 mg  1,000 mg Oral QHS Ursula Alert, MD   1,000 mg at 04/06/16 2049  . divalproex (DEPAKOTE) DR tablet 500 mg  500 mg Oral QPC breakfast Ursula Alert, MD   500 mg at 04/07/16 0814  . gabapentin (NEURONTIN) capsule 600 mg  600 mg Oral QHS Ursula Alert, MD   600 mg at 04/06/16 2044  . hydrocerin (EUCERIN) cream   Topical BID Ursula Alert, MD   1 application at 37/62/83 0741  . insulin aspart (novoLOG) injection 0-15 Units  0-15 Units Subcutaneous TID WC Ursula Alert, MD   2 Units at 04/06/16 1724  . insulin aspart (novoLOG) injection 0-5 Units  0-5 Units  Subcutaneous QHS Ursula Alert, MD      . lithium carbonate capsule 150 mg  150 mg Oral BID WC Ursula Alert, MD   150 mg at 04/07/16 0738  . LORazepam (ATIVAN) tablet 1 mg  1 mg Oral Q6H PRN Ursula Alert, MD   1 mg at 04/07/16 1356   Or  . LORazepam (ATIVAN) injection 1 mg  1 mg Intramuscular Q6H PRN Ursula Alert, MD      . magnesium hydroxide (MILK OF MAGNESIA) suspension 30 mL  30 mL Oral Daily PRN Laverle Hobby, PA-C      . magnesium oxide (MAG-OX) tablet 400 mg  400 mg Oral Daily Dystany Duffy, MD   400 mg at 04/07/16 0739  . nicotine polacrilex (NICORETTE) gum 2 mg  2 mg Oral PRN Ursula Alert, MD      . OLANZapine zydis (ZYPREXA) disintegrating tablet 20 mg  20 mg Oral QHS Ursula Alert, MD   20  mg at 04/06/16 2044  . OLANZapine zydis (ZYPREXA) disintegrating tablet 5 mg  5 mg Oral Daily Ursula Alert, MD   5 mg at 04/07/16 0741  . OXcarbazepine (TRILEPTAL) tablet 150 mg  150 mg Oral BID Ursula Alert, MD   150 mg at 04/07/16 0739  . propranolol (INDERAL) tablet 10 mg  10 mg Oral TID Ursula Alert, MD   10 mg at 04/07/16 1145  . temazepam (RESTORIL) capsule 30 mg  30 mg Oral QHS Ursula Alert, MD   30 mg at 04/06/16 2041  . traZODone (DESYREL) tablet 25 mg  25 mg Oral QHS Ursula Alert, MD   25 mg at 04/06/16 2050    Lab Results:  Results for orders placed or performed during the hospital encounter of 03/11/16 (from the past 48 hour(s))  Glucose, capillary     Status: Abnormal   Collection Time: 04/05/16  4:33 PM  Result Value Ref Range   Glucose-Capillary 106 (H) 65 - 99 mg/dL  Glucose, capillary     Status: Abnormal   Collection Time: 04/05/16  7:52 PM  Result Value Ref Range   Glucose-Capillary 120 (H) 65 - 99 mg/dL  Glucose, capillary     Status: None   Collection Time: 04/06/16  5:54 AM  Result Value Ref Range   Glucose-Capillary 92 65 - 99 mg/dL  Glucose, capillary     Status: Abnormal   Collection Time: 04/06/16 11:46 AM  Result Value Ref Range   Glucose-Capillary 110 (H) 65 - 99 mg/dL  Glucose, capillary     Status: Abnormal   Collection Time: 04/06/16  4:48 PM  Result Value Ref Range   Glucose-Capillary 129 (H) 65 - 99 mg/dL  Magnesium     Status: None   Collection Time: 04/06/16  6:11 PM  Result Value Ref Range   Magnesium 1.9 1.7 - 2.4 mg/dL    Comment: Performed at [facility], [place] [address] [place], [place] [address]  Basic metabolic panel     Status: Abnormal   Collection Time: 04/06/16  6:11 PM  Result Value Ref Range   Sodium 139 135 - 145 mmol/L   Potassium 3.6 3.5 - 5.1 mmol/L   Chloride 102 101 - 111 mmol/L   CO2 31 22 - 32 mmol/L   Glucose, Bld 133 (H) 65 - 99 mg/dL   BUN 19 6 - 20 mg/dL   Creatinine, Ser 0.92 0.44 -  1.00 mg/dL   Calcium 8.9 8.9 - 10.3 mg/dL   GFR calc non  Af Amer >60 >60 mL/min   GFR calc Af Amer >60 >60 mL/min    Comment: (NOTE) The eGFR has been calculated using the CKD EPI equation. This calculation has not been validated in all clinical situations. eGFR's persistently <60 mL/min signify possible Chronic Kidney Disease.    Anion gap 6 5 - 15    Comment: Performed at [facility], [place] [address] [place], [place] [address]  Glucose, capillary     Status: Abnormal   Collection Time: 04/06/16  8:32 PM  Result Value Ref Range   Glucose-Capillary 107 (H) 65 - 99 mg/dL  Glucose, capillary     Status: None   Collection Time: 04/07/16  5:38 AM  Result Value Ref Range   Glucose-Capillary 85 65 - 99 mg/dL  Glucose, capillary     Status: None   Collection Time: 04/07/16 12:19 PM  Result Value Ref Range   Glucose-Capillary 95 65 - 99 mg/dL    Blood Alcohol level:  Lab Results  Component Value Date   ETH <5 03/11/2016   ETH <5 56/38/9373    Metabolic Disorder Labs: Lab Results  Component Value Date   HGBA1C 5.2 03/13/2016   MPG 103 03/13/2016   Lab Results  Component Value Date   PROLACTIN 27.9 (H) 03/13/2016   Lab Results  Component Value Date   CHOL 150 03/13/2016   TRIG 69 03/13/2016   HDL 43 03/13/2016   CHOLHDL 3.5 03/13/2016   VLDL 14 03/13/2016   LDLCALC 93 03/13/2016    Physical Findings: AIMS: Facial and Oral Movements Muscles of Facial Expression: None, normal Lips and Perioral Area: None, normal Jaw: None, normal Tongue: None, normal,Extremity Movements Upper (arms, wrists, hands, fingers): None, normal Lower (legs, knees, ankles, toes): None, normal, Trunk Movements Neck, shoulders, hips: None, normal, Overall Severity Severity of abnormal movements (highest score from questions above): None, normal Incapacitation due to abnormal movements: None, normal Patient's awareness of abnormal movements (rate only patient's report): No  Awareness, Dental Status Current problems with teeth and/or dentures?: No Does patient usually wear dentures?: No  CIWA:    COWS:     Musculoskeletal: Strength & Muscle Tone: within normal limits Gait & Station: normal Patient leans: N/A  Psychiatric Specialty Exam: Physical Exam  Nursing note and vitals reviewed.   Review of Systems  Psychiatric/Behavioral: The patient is nervous/anxious.   All other systems reviewed and are negative.   Blood pressure (!) 136/91, pulse 76, temperature 97.8 F (36.6 C), resp. rate 18, height _0  (1.651 m), weight 92.5 kg (204 lb), SpO2 99 %.Body mass index is 33.95 kg/m.  General Appearance: Fairly Groomed  Eye Contact:  Fair  Speech:  Pressured  Volume:  loud on and off   Mood:  varies , happy , cheerful often, angry at times   Affect:  does have on and off mood lability  Thought Process:  Linear and Descriptions of Associations: Circumstantial   Orientation:  Full (Time, Place, and Person)  Thought Content:  Delusions and Rumination makes delusional random statements like " Glennon Mac is my cousin."  Suicidal Thoughts:  No  Homicidal Thoughts:  No  Memory:  Immediate;   Fair Recent;   Fair Remote;   Poor  Judgement:  Impaired  Insight:  Shallow  Psychomotor Activity:  Restlessness  Concentration:  Concentration: Fair and Attention Span: Fair  Recall:  [employer] of Knowledge:  Fair  Language:  Fair  Akathisia:  No  Handed:  Right  AIMS (if indicated):     Assets:  Desire for Improvement Talents/Skills  ADL's:  Intact  Cognition:  WNL  Sleep:  Number of Hours: 6.25   Schizoaffective disorder, bipolar type ([place]) unstable   Will continue today 04/07/16 plan as below except where it is noted.   Treatment plan: Patient continues to be pressured , has mood lability, and is intrusive often. Pt continues to need redirection on the unit. Pt also has pedal edema , hospitalist service has been following her for the same - continue to  readjust her medications. Daily contact with patient to assess and evaluate symptoms and progress in treatment and Medication management   For schizoaffective do : Continue Zyprexa 25 mg po daily in divided doses for psychosis/mood sx. Will continue Depakote DR 500 mg po daily and 1000 mg po qhs . Dosing schedule changed to help her sleep better , reduce restlessness at night. Will continue Neurontin to 600 mg po qhs for restlessness/anxiety sx. Will continue cogentin 1 mg po qhs for EPS. Will continue Trileptal 150 mg po bid . Could taper it off, once she is more stable on Li .  Li level is low, will increase to Li 300 mg po bid. Li level repeat in 5 days. Li level - 04/04/16 - Results for WENONAH, MILO (MRN [id number]) as of 04/06/2016 12:49  Ref. Range 04/04/2016 06:09  Lithium Latest Ref Range: 0.60 - 1.20 mmol/L 0.12 (L)      For insomnia:reviewed as below. Restoril 30 mg po qhs. Trazodone 25 mg po qhs to augment Restoril. (will be cautious since she also has a hx of EKG prolongation.) ( Patient failed Ambien, Lunesta , doxepin , elavil  Trials)    For restlessness/akathisia: Will increase Propranolol to 10 mg po tid.  For pedal edema: BMP reviewed - BUN/cr - wnl, K+ - 3.6 , mg- 1.9. Please see hospitalist consult note- recommendation per hospitalist. Elevate leg. Will continue to monitor.   H/O Prolonged qt: Will order repeat EKG.    Given PPD test  03/19/16 - for ALF placement- - negative .  DM: Will continue current medication regimen.  Discontinued 1;1 precaution.  Per CSW patient's daughter is coming in for a family meeting with patient to discuss disposition.  Pt referred to [facility] pending ALF placement if she is more stable.Per CSW transitional housing staff to evaluate patient today - pt agrees with the same.   Patient to participate in therapeutic milieu   Katalyna Socarras, MD 04/07/2016, 2:25 PM

## 2016-04-07 NOTE — Progress Notes (Signed)
Pt woke up this morning and initially was in a good mood, but her behavior escalated to crying about her grandchildren and children, to anger about the MHT not asking her about what she wanted for breakfast.  She was already getting agitated when the tray was brought to her.  Pt is diabetic so a diabetic tray was brought to her, but she said, "I'm not diabetic and I'm not eating that shit.  I'm not trailer trash and I'm not eating trailer trash food."  Pt then threw the tray in the floor and continued to rant and rave.  A staff member went to get her another tray of her liking and other staff cleaned the floor.  The oncoming shift was notified of pt's behavior.

## 2016-04-08 LAB — GLUCOSE, CAPILLARY
GLUCOSE-CAPILLARY: 118 mg/dL — AB (ref 65–99)
GLUCOSE-CAPILLARY: 107 mg/dL — AB (ref 65–99)
Glucose-Capillary: 85 mg/dL (ref 65–99)
Glucose-Capillary: 112 mg/dL — ABNORMAL HIGH (ref 65–99)

## 2016-04-08 MED ORDER — OXCARBAZEPINE 150 MG PO TABS
150.0000 mg | ORAL_TABLET | Freq: Every day | ORAL | Status: DC
  Administered 2016-04-09: 150 mg via ORAL
  Filled 2016-04-08 (×3): qty 1

## 2016-04-08 NOTE — [facility] Group Notes (Signed)
[facility] Group Notes:  (Counselor/Nursing/MHT/Case Management/Adjunct)  04/08/2016 1:15PM  Type of Therapy:  Group Therapy  Participation Level:  Active  Participation Quality:  Appropriate  Affect:  Flat  Cognitive:  Oriented  Insight:  Improving  Engagement in Group:  Limited  Engagement in Therapy:  Limited  Modes of Intervention:  Discussion, Exploration and Socialization  Summary of Progress/Problems: The topic for group was balance in life.  Pt participated in the discussion about when their life was in balance and out of balance and how this feels.  Pt discussed ways to get back in balance and short term goals they can work on to get where they want to be. Excused herself to go lie down.  Eventually came back.  Was disruptive upon entry, but then sat down and went to sleep for the rest of group time.   [name], Ammaar Encina B 04/08/2016 12:54 PM

## 2016-04-08 NOTE — Progress Notes (Signed)
[facility] MD Progress Note  04/08/2016 12:19 PM Quatisha Zylka  MRN:  [id number]   Subjective: Pt states " I do not think I want to go with what my daughter is saying. I rather stay on my own , I will take care of my money."    .    Objective:Patient seen and chart reviewed.Discussed patient with treatment team.  Pt today seen as labile, loud on the unit on and off. Pt continues to have periods when she makes delusional statements and is hyperactive . Pt can also be intrusive , requires PRN medications on and off as well as redirection. Pt also with multiple medical problems, has pedal edema worsening - which is being followed by hospitalist. Will continue to treat.        Principal Problem: Schizoaffective disorder, bipolar type ([place]) Diagnosis:   Patient Active Problem List   Diagnosis Date Noted  . Pedal edema [R60.0] 04/06/2016  . Electrolyte imbalance [E87.8] 03/27/2016  . Prolonged Q-T interval on ECG [R94.31] 03/13/2016  . Schizoaffective disorder, bipolar type ([place]) [F25.0] 03/12/2016  . Diabetes mellitus ([place]) [E11.9] 03/12/2016   Total Time spent with patient: 25 minutes  Past Psychiatric History: Please see H&P.   Past Medical History:  Past Medical History:  Diagnosis Date  . Diabetes mellitus without complication [facility])     Past Surgical History:  Procedure Laterality Date  . umbical hernia repair     Family History: Please see H&P.  Family Psychiatric  History: Please see H&P.  Social History: Please see H&P.  History  Alcohol Use No     History  Drug Use No    Social History   Social History  . Marital status: Single    Spouse name: N/A  . Number of children: N/A  . Years of education: N/A   Social History Main Topics  . Smoking status: Current Every Day Smoker    Packs/day: 0.25  . Smokeless tobacco: Never Used  . Alcohol use No  . Drug use: No  . Sexual activity: Not Asked   Other Topics Concern  . None   Social History Narrative   . None   Additional Social History:   Sleep: Fair Improving  Appetite:  Fair, Improving  Current Medications: Current Facility-Administered Medications  Medication Dose Route Frequency Provider Last Rate Last Dose  . acetaminophen (TYLENOL) tablet 650 mg  650 mg Oral Q6H PRN Laverle Hobby, PA-C   650 mg at 04/08/16 7564  . alum & mag hydroxide-simeth (MAALOX/MYLANTA) 200-200-20 MG/5ML suspension 30 mL  30 mL Oral Q4H PRN Laverle Hobby, PA-C   30 mL at 03/19/16 0021  . benztropine (COGENTIN) tablet 1 mg  1 mg Oral QHS Ursula Alert, MD   1 mg at 04/07/16 2103  . divalproex (DEPAKOTE) DR tablet 1,000 mg  1,000 mg Oral QHS Ursula Alert, MD   1,000 mg at 04/07/16 2103  . divalproex (DEPAKOTE) DR tablet 500 mg  500 mg Oral QPC breakfast Ursula Alert, MD   500 mg at 04/08/16 0818  . gabapentin (NEURONTIN) capsule 600 mg  600 mg Oral QHS Ursula Alert, MD   600 mg at 04/07/16 2103  . insulin aspart (novoLOG) injection 0-15 Units  0-15 Units Subcutaneous TID WC Ursula Alert, MD   2 Units at 04/06/16 1724  . insulin aspart (novoLOG) injection 0-5 Units  0-5 Units Subcutaneous QHS Jhamari Markowicz, MD      . lithium carbonate capsule 300 mg  300 mg Oral  BID WC Ursula Alert, MD   300 mg at 04/08/16 0741  . LORazepam (ATIVAN) tablet 1 mg  1 mg Oral Q6H PRN Ursula Alert, MD   1 mg at 04/08/16 0818   Or  . LORazepam (ATIVAN) injection 1 mg  1 mg Intramuscular Q6H PRN Ursula Alert, MD      . magnesium hydroxide (MILK OF MAGNESIA) suspension 30 mL  30 mL Oral Daily PRN Laverle Hobby, PA-C      . magnesium oxide (MAG-OX) tablet 400 mg  400 mg Oral Daily Nicoletta Hush, MD   400 mg at 04/08/16 0741  . nicotine polacrilex (NICORETTE) gum 2 mg  2 mg Oral PRN Ursula Alert, MD      . OLANZapine zydis (ZYPREXA) disintegrating tablet 20 mg  20 mg Oral QHS Ursula Alert, MD   20 mg at 04/07/16 2103  . OLANZapine zydis (ZYPREXA) disintegrating tablet 5 mg  5 mg Oral Daily Ursula Alert, MD    5 mg at 04/08/16 0742  . [START ON 04/09/2016] OXcarbazepine (TRILEPTAL) tablet 150 mg  150 mg Oral Daily Missael Ferrari, MD      . propranolol (INDERAL) tablet 10 mg  10 mg Oral TID Ursula Alert, MD   10 mg at 04/08/16 1147  . temazepam (RESTORIL) capsule 30 mg  30 mg Oral QHS Ursula Alert, MD   30 mg at 04/07/16 2103  . traZODone (DESYREL) tablet 25 mg  25 mg Oral QHS Ursula Alert, MD   25 mg at 04/07/16 2103    Lab Results:  Results for orders placed or performed during the hospital encounter of 03/11/16 (from the past 48 hour(s))  Glucose, capillary     Status: Abnormal   Collection Time: 04/06/16  4:48 PM  Result Value Ref Range   Glucose-Capillary 129 (H) 65 - 99 mg/dL  Magnesium     Status: None   Collection Time: 04/06/16  6:11 PM  Result Value Ref Range   Magnesium 1.9 1.7 - 2.4 mg/dL    Comment: Performed at [facility], [place] [address] [place], [place] [address]  Basic metabolic panel     Status: Abnormal   Collection Time: 04/06/16  6:11 PM  Result Value Ref Range   Sodium 139 135 - 145 mmol/L   Potassium 3.6 3.5 - 5.1 mmol/L   Chloride 102 101 - 111 mmol/L   CO2 31 22 - 32 mmol/L   Glucose, Bld 133 (H) 65 - 99 mg/dL   BUN 19 6 - 20 mg/dL   Creatinine, Ser 0.92 0.44 - 1.00 mg/dL   Calcium 8.9 8.9 - 10.3 mg/dL   GFR calc non Af Amer >60 >60 mL/min   GFR calc Af Amer >60 >60 mL/min    Comment: (NOTE) The eGFR has been calculated using the CKD EPI equation. This calculation has not been validated in all clinical situations. eGFR's persistently <60 mL/min signify possible Chronic Kidney Disease.    Anion gap 6 5 - 15    Comment: Performed at [facility], [place] [address], [place], [place] [address]  Glucose, capillary     Status: Abnormal   Collection Time: 04/06/16  8:32 PM  Result Value Ref Range   Glucose-Capillary 107 (H) 65 - 99 mg/dL  Glucose, capillary     Status: None   Collection Time: 04/07/16  5:38 AM  Result  Value Ref Range   Glucose-Capillary 85 65 - 99 mg/dL  Glucose, capillary  Status: None   Collection Time: 04/07/16 12:19 PM  Result Value Ref Range   Glucose-Capillary 95 65 - 99 mg/dL  Glucose, capillary     Status: Abnormal   Collection Time: 04/07/16  5:08 PM  Result Value Ref Range   Glucose-Capillary 101 (H) 65 - 99 mg/dL  Brain natriuretic peptide     Status: None   Collection Time: 04/07/16  6:12 PM  Result Value Ref Range   B Natriuretic Peptide 83.7 0.0 - 100.0 pg/mL    Comment: Performed at [facility], [place] [address], [place], [place] [address]  Hepatic function panel     Status: Abnormal   Collection Time: 04/07/16  6:12 PM  Result Value Ref Range   Total Protein 7.1 6.5 - 8.1 g/dL   Albumin 3.5 3.5 - 5.0 g/dL   AST 19 15 - 41 U/L   ALT 14 14 - 54 U/L   Alkaline Phosphatase 59 38 - 126 U/L   Total Bilirubin 0.3 0.3 - 1.2 mg/dL   Bilirubin, Direct <0.1 (L) 0.1 - 0.5 mg/dL   Indirect Bilirubin NOT CALCULATED 0.3 - 0.9 mg/dL    Comment: Performed at [facility], [place] [address] [place], [place] [address]  Glucose, capillary     Status: None   Collection Time: 04/07/16  8:40 PM  Result Value Ref Range   Glucose-Capillary 91 65 - 99 mg/dL  Glucose, capillary     Status: Abnormal   Collection Time: 04/08/16  5:51 AM  Result Value Ref Range   Glucose-Capillary 118 (H) 65 - 99 mg/dL   Comment 1 Notify RN   Glucose, capillary     Status: None   Collection Time: 04/08/16 12:00 PM  Result Value Ref Range   Glucose-Capillary 85 65 - 99 mg/dL   Comment 1 Notify RN    Comment 2 Document in Chart     Blood Alcohol level:  Lab Results  Component Value Date   ETH <5 03/11/2016   ETH <5 73/22/0254    Metabolic Disorder Labs: Lab Results  Component Value Date   HGBA1C 5.2 03/13/2016   MPG 103 03/13/2016   Lab Results  Component Value Date   PROLACTIN 27.9 (H) 03/13/2016   Lab Results  Component Value Date   CHOL 150  03/13/2016   TRIG 69 03/13/2016   HDL 43 03/13/2016   CHOLHDL 3.5 03/13/2016   VLDL 14 03/13/2016   LDLCALC 93 03/13/2016    Physical Findings: AIMS: Facial and Oral Movements Muscles of Facial Expression: None, normal Lips and Perioral Area: None, normal Jaw: None, normal Tongue: None, normal,Extremity Movements Upper (arms, wrists, hands, fingers): None, normal Lower (legs, knees, ankles, toes): None, normal, Trunk Movements Neck, shoulders, hips: None, normal, Overall Severity Severity of abnormal movements (highest score from questions above): None, normal Incapacitation due to abnormal movements: None, normal Patient's awareness of abnormal movements (rate only patient's report): No Awareness, Dental Status Current problems with teeth and/or dentures?: No Does patient usually wear dentures?: No  CIWA:  CIWA-Ar Total: 4 COWS:  COWS Total Score: 3  Musculoskeletal: Strength & Muscle Tone: within normal limits Gait & Station: normal Patient leans: N/A  Psychiatric Specialty Exam: Physical Exam  Nursing note and vitals reviewed.   Review of Systems  Psychiatric/Behavioral: The patient is nervous/anxious.   All other systems reviewed and are negative.   Blood pressure (!) 141/81, pulse 86, temperature 98.5 F (36.9 C), resp. rate 18, height '5\' 5"'$  (1.651  m), weight 92.5 kg (204 lb), SpO2 99 %.Body mass index is 33.95 kg/m.  General Appearance: Fairly Groomed  Eye Contact:  Fair  Speech:  Pressured  Volume:  loud on and off   Mood:  labile often  Affect:  congruent  Thought Process:  Linear and Descriptions of Associations: Circumstantial   Orientation:  Full (Time, Place, and Person)  Thought Content:  Delusions and Rumination, random delusional statements - trump is my cousin  Suicidal Thoughts:  No  Homicidal Thoughts:  No  Memory:  Immediate;   Fair Recent;   Fair Remote;   Poor  Judgement:  Impaired  Insight:  Shallow  Psychomotor Activity:  Restlessness   Concentration:  Concentration: Fair and Attention Span: Fair  Recall:  [employer] of Knowledge:  Fair  Language:  Fair  Akathisia:  No  Handed:  Right  AIMS (if indicated):     Assets:  Desire for Improvement Talents/Skills  ADL's:  Intact  Cognition:  WNL  Sleep:  Number of Hours: 5.75   Schizoaffective disorder, bipolar type ([place]) unstable - improving  Will continue today 04/08/16 plan as below except where it is noted.      Treatment plan: Patient often seen as loud and intrusive on the unit , per daughter who visited for a family meeting yesterday , she feels patient is still hyperactive , which is not her baseline. Pt also with medical issues , being managed by hospitalist . Will continue treatment.   Daily contact with patient to assess and evaluate symptoms and progress in treatment and Medication management    For schizoaffective do: Zyprexa 25 mg po daily in divided doses. Cogentin 1 mg po qhs for EPS. Depakote DR 500 mg po daily and 1000 mg po qhs. Neurontin 600 mg po qhs for restlessness. Will taper off Trileptal now that she is on Li. Continue Li - increased to 300 mg po bid . Li level in 4 days. Last level was subtherapeutic.   For insomnia: Restoril 30 mg po qhs. Trazodone 25 mg po qhs ( will be cautious due to h/o qtc prolongation). Patient failed Ambien, Lunesta , doxepin , elavil  Trials)    For restlessness/akathisia:reviewed as below Will increase Propranolol to 10 mg po tid.  For pedal edema: Per hospitalist recommendations - see consult note. BMP reviewed - BUN/cr - wnl, K+ - 3.6 , mg- 1.9. Elevate leg. Will continue to monitor.   H/O prolonged QTC on EKG and she is also on Lithium: Repeat EKG today.   Given PPD test  03/19/16 - for ALF placement- - negative .  DM: Will continue current medication regimen.   Pt referred to [facility] . Pt also could be discharged back to her boarding home - if she improves. Family meeting was  conducted yesterday.   Patient to participate in therapeutic milieu   Chanese Hartsough, MD 04/08/2016, 12:19 PM

## 2016-04-08 NOTE — Progress Notes (Signed)
D: Pt continues to be delusional and intrusive. Pt's daughter visited today which kept pt entertained for a short period of time, then pt left her daughter in  the dayroom. Pt is still loud on the unit and takes redirection. Pt has no questions or concerns.    A:  Support and encouragement was offered. 15 min checks continued for safety.  R: Pt remains safe.

## 2016-04-08 NOTE — Progress Notes (Addendum)
  DATA ACTION RESPONSE  Objective- Pt. is up and visible in the milieu, talking to self and others around her. Pt. presents with a labile affect and mood. Pt. remains loud/angry/irritable/impulsive/demanding.To Clinical research associatewriter, Pt. Is receptive to advice and re-direction. Pt. has establish good rapport with Clinical research associatewriter and therefore is appropriate. Subjective- Denies having any SI/HI/AVH/Pain at this time. Pt. states " You are my cousin from [place]; we are family". Pt. continues to  remain safe and required constant re-direction on the unit. Fall precaution in effect.   1:1 interaction in private to establish rapport. Encouragement, education, & support given from staff. Meds. ordered and administered. CBG 91 at bedtime. Pt. allowed writer to obtained EKG, however, Pt. kept moving during procedure. EKG report would not print. Rhythm report was printed instead. Will attempt again later.   Safety maintained with Q 15 checks. Continues to follow treatment plan and will monitor closely. No additonal questions/concerns noted.

## 2016-04-08 NOTE — Plan of Care (Signed)
Problem: Education: Goal: Ability to state activities that reduce stress will improve Outcome: Not Progressing Nurse discussed anxiety/coping skills with patient.

## 2016-04-08 NOTE — Progress Notes (Signed)
Adult Psychoeducational Group Note  Date:  04/08/2016 Time:  9:00 PM  Group Topic/Focus:  Wrap-Up Group:   The focus of this group is to help patients review their daily goal of treatment and discuss progress on daily workbooks.  Participation Level:  Active  Participation Quality:  Intrusive  Affect:  Labile  Cognitive:  Lacking  Insight: Limited  Engagement in Group:  Engaged  Modes of Intervention:  Socialization and Support  Additional Comments:  Patient attended and participated in group tonight. She reports having a good day. She talked to family, spoke with her doctor. Her daughter visited with her   Scot DockFrancis, Daphane Odekirk Dacosta 04/08/2016, 9:00 PM

## 2016-04-08 NOTE — Progress Notes (Signed)
D:  Patient's self inventory sheet, patient sleeps good, sleep medication helpful.  Good appetite, hyper energy level, good concentration.  Denied depression, hopeless and anxiety.  Denied withdrawals.  Denied SI.  Denied physical problems.  Denied pain.  Goal is to do better, plans to get help.  Plans to listen to staff. A:  Medications administered per MD orders.  Emotional support and encouragement given patient. R:  Patient denied SI and HI, contracts for safety.  Denied A/V hallucinations.  Safety maintained with 15 minute checks. Patient has been yelling, screaming, about people who stole money from her.  Someone left hall and patient went behind person and went to nurse's station.  Patient was brought back to 500 hall.  Patient continued to be upset about someone who took money and her belongings.  Staff talking to patient who did calm down.

## 2016-04-08 NOTE — Progress Notes (Signed)
Recreation Therapy Notes  Date: 04/08/16 Time: 1000 Location: [address] Hall Dayroom  Group Topic: Wellness  Goal Area(s) Addresses:  Patient will define components of whole wellness. Patient will verbalize benefit of whole wellness.  Behavioral Response: Engaged  Intervention:  ArchivistChairs, beach ball   Activity: Keep it ContractorGoing Volleyball.  Patients were positioned in a circle.  Patients were to toss a beach ball back and forth to each other without letting the ball come to a stop on the floor.  Patients could bounce the ball off the floor but the ball had to keep moving within the circle.  LRT would count the number hits on the ball.  Once the ball came to a complete stop the count started over.  Education: Wellness, Building control surveyorDischarge Planning.   Education Outcome: Acknowledges education/In group clarification offered/Needs additional education.   Clinical Observations/Feedback: Pt participated in the activity with prompting from staff and peers.  Pt needed redirecting to remain seated during the activity.  Pt was bright and seemed to enjoy the activity.  Pt was complaining about her feet hurting.  Pt left early and did not return.    Caroll RancherMarjette Angellynn Kimberlin, LRT/CTRS       Caroll RancherLindsay, Sintia Mckissic A 04/08/2016 11:54 AM

## 2016-04-08 NOTE — Plan of Care (Signed)
Problem: Self-Care: Goal: Ability to participate in self-care as condition permits will improve Outcome: Not Progressing Pt. needs assistance at night to the bathroom.

## 2016-04-09 DIAGNOSIS — R4689 Other symptoms and signs involving appearance and behavior: Secondary | ICD-10-CM

## 2016-04-09 DIAGNOSIS — R451 Restlessness and agitation: Secondary | ICD-10-CM

## 2016-04-09 LAB — GLUCOSE, CAPILLARY
GLUCOSE-CAPILLARY: 128 mg/dL — AB (ref 65–99)
GLUCOSE-CAPILLARY: 105 mg/dL — AB (ref 65–99)
Glucose-Capillary: 109 mg/dL — ABNORMAL HIGH (ref 65–99)
Glucose-Capillary: 99 mg/dL (ref 65–99)
Glucose-Capillary: 106 mg/dL — ABNORMAL HIGH (ref 65–99)

## 2016-04-09 MED ORDER — FUROSEMIDE 40 MG PO TABS
40.0000 mg | ORAL_TABLET | Freq: Every day | ORAL | Status: DC
  Administered 2016-04-09 – 2016-04-16 (×8): 40 mg via ORAL
  Filled 2016-04-09 (×2): qty 1
  Filled 2016-04-09 (×2): qty 2
  Filled 2016-04-09: qty 7
  Filled 2016-04-09 (×7): qty 1

## 2016-04-09 MED ORDER — POTASSIUM CHLORIDE CRYS ER 20 MEQ PO TBCR
40.0000 meq | EXTENDED_RELEASE_TABLET | Freq: Two times a day (BID) | ORAL | Status: AC
  Administered 2016-04-09 – 2016-04-10 (×2): 40 meq via ORAL
  Filled 2016-04-09 (×3): qty 2

## 2016-04-09 MED ORDER — POTASSIUM CHLORIDE CRYS ER 20 MEQ PO TBCR
40.0000 meq | EXTENDED_RELEASE_TABLET | Freq: Two times a day (BID) | ORAL | Status: DC
Start: 2016-04-09 — End: 2016-04-09
  Filled 2016-04-09 (×2): qty 2

## 2016-04-09 MED ORDER — HALOPERIDOL LACTATE 5 MG/ML IJ SOLN: 5.0000 mg | Freq: Four times a day (QID) | INTRAMUSCULAR | Status: DC | PRN

## 2016-04-09 MED ORDER — DIPHENHYDRAMINE HCL 50 MG/ML IJ SOLN
50.0000 mg | Freq: Once | INTRAMUSCULAR | Status: DC
  Filled 2016-04-09: qty 1

## 2016-04-09 MED ORDER — MAGNESIUM OXIDE 400 (241.3 MG) MG PO TABS
400.0000 mg | ORAL_TABLET | Freq: Two times a day (BID) | ORAL | Status: DC
  Administered 2016-04-09 – 2016-04-16 (×14): 400 mg via ORAL
  Filled 2016-04-09 (×9): qty 1
  Filled 2016-04-09: qty 14
  Filled 2016-04-09: qty 1
  Filled 2016-04-09: qty 14
  Filled 2016-04-09 (×6): qty 1

## 2016-04-09 MED ORDER — HALOPERIDOL LACTATE 5 MG/ML IJ SOLN
10.0000 mg | Freq: Once | INTRAMUSCULAR | Status: DC
Start: 2016-04-09 — End: 2016-04-14
  Filled 2016-04-09: qty 2

## 2016-04-09 MED ORDER — OXCARBAZEPINE 150 MG PO TABS
150.0000 mg | ORAL_TABLET | Freq: Two times a day (BID) | ORAL | Status: DC
  Administered 2016-04-09 – 2016-04-13 (×8): 150 mg via ORAL
  Filled 2016-04-09 (×10): qty 1

## 2016-04-09 MED ORDER — HALOPERIDOL 5 MG PO TABS
10.0000 mg | ORAL_TABLET | Freq: Once | ORAL | Status: DC
  Filled 2016-04-09: qty 2

## 2016-04-09 NOTE — [facility] Group Notes (Signed)
Type of Therapy:  Group Therapy   Participation Level:  Engaged  Participation Quality:  Attentive  Affect:  Appropriate   Cognitive:  Alert   Insight:  Engaged  Engagement in Therapy:  Improving   Mode s of Intervention:  Education, Exploration, Socialization   Summary of Progress/Problems: Catherine Bender was engaged the beginning of group, but over time became disruptive and intrusive. She was in and out of group multiple times.   Tammi from [employer] was here to tell her story of recovery and inform patients about [facility] and their services.

## 2016-04-09 NOTE — Progress Notes (Signed)
D: Pt was observed using the restroom, pt had her walker with her. On the way back to the bed, pt fell.  When staff entered the room it was noted that pt was laying with her stomach to the floor. Pt's pants were down around her ankles and wet. When she realized staff was in the room pt stated, "call my lawyer". Pt didn't complain of pain or discomfort.   A: Staff cleaned pt, put on clean gown, and washed pt's clothes. Encouraged pt to use call bell or call for help before ambulating.  Support and encouragement was offered. 15 min checks continued for safety.  R: Pt remains safe.

## 2016-04-09 NOTE — Progress Notes (Addendum)
Received a follow-up call from  Jomarie LongsSaramma Eappen, MD Medicine is following patient for these following issues, and she would like an updated note from medicine  #1 prolonged QTC-patient is on antipsychotics, last EKG 2/28 QTC around 456, defer management of antipsychotic medications to psychiatry, keep potassium greater than 4 and magnesium greater than 2. Psychiatry would like me to check lithium level as well. TSH okay on 2/2  #2 bilateral lower extremity edema-BNP okay, chest x-ray shows mild cardiomegaly, given the bilateral lower extremity edema and minimal improvement on HCTZ, patient is being changed to Lasix 40 mg daily Will recheck electrolytes, renal function, patient would benefit from elective outpatient,nonurgent  2-D echo  #3 hypertension-blood pressure somewhat uncontrolled, patient on propranolol, not been started on Lasix, could adjust dose of propranolol if blood pressure still elevated tomorrow

## 2016-04-09 NOTE — Tx Team (Signed)
Interdisciplinary Treatment and Diagnostic Plan Update  04/09/2016 Time of Session: 11:15 AM  Catherine LimberRenitha Bender MRN: [id number]  Principal Diagnosis: Schizoaffective disorder, bipolar type (HCC)  Secondary Diagnoses: Principal Problem:   Schizoaffective disorder, bipolar type (HCC) Active Problems:   Prolonged Q-T interval on ECG   Electrolyte imbalance   Pedal edema   Current Medications:  Current Facility-Administered Medications  Medication Dose Route Frequency Provider Last Rate Last Dose  . acetaminophen (TYLENOL) tablet 650 mg  650 mg Oral Q6H PRN Kerry HoughSpencer E Simon, PA-C   650 mg at 04/09/16 1011  . alum & mag hydroxide-simeth (MAALOX/MYLANTA) 200-200-20 MG/5ML suspension 30 mL  30 mL Oral Q4H PRN Kerry HoughSpencer E Simon, PA-C   30 mL at 03/19/16 0021  . benztropine (COGENTIN) tablet 1 mg  1 mg Oral QHS Jomarie LongsSaramma Eappen, MD   1 mg at 04/08/16 2129  . divalproex (DEPAKOTE) DR tablet 1,000 mg  1,000 mg Oral QHS Jomarie LongsSaramma Eappen, MD   1,000 mg at 04/08/16 2129  . divalproex (DEPAKOTE) DR tablet 500 mg  500 mg Oral QPC breakfast Jomarie LongsSaramma Eappen, MD   500 mg at 04/09/16 0828  . gabapentin (NEURONTIN) capsule 600 mg  600 mg Oral QHS Jomarie LongsSaramma Eappen, MD   600 mg at 04/08/16 2129  . insulin aspart (novoLOG) injection 0-15 Units  0-15 Units Subcutaneous TID WC Jomarie LongsSaramma Eappen, MD   2 Units at 04/06/16 1724  . insulin aspart (novoLOG) injection 0-5 Units  0-5 Units Subcutaneous QHS Saramma Eappen, MD      . lithium carbonate capsule 300 mg  300 mg Oral BID WC Jomarie LongsSaramma Eappen, MD   300 mg at 04/09/16 0828  . LORazepam (ATIVAN) tablet 1 mg  1 mg Oral Q6H PRN Jomarie LongsSaramma Eappen, MD   1 mg at 04/08/16 1615   Or  . LORazepam (ATIVAN) injection 1 mg  1 mg Intramuscular Q6H PRN Jomarie LongsSaramma Eappen, MD      . magnesium hydroxide (MILK OF MAGNESIA) suspension 30 mL  30 mL Oral Daily PRN Kerry HoughSpencer E Simon, PA-C      . magnesium oxide (MAG-OX) tablet 400 mg  400 mg Oral Daily Jomarie LongsSaramma Eappen, MD   400 mg at 04/09/16 0828  . nicotine  polacrilex (NICORETTE) gum 2 mg  2 mg Oral PRN Jomarie LongsSaramma Eappen, MD      . OLANZapine zydis (ZYPREXA) disintegrating tablet 20 mg  20 mg Oral QHS Jomarie LongsSaramma Eappen, MD   20 mg at 04/08/16 2129  . OLANZapine zydis (ZYPREXA) disintegrating tablet 5 mg  5 mg Oral Daily Jomarie LongsSaramma Eappen, MD   5 mg at 04/09/16 0828  . OXcarbazepine (TRILEPTAL) tablet 150 mg  150 mg Oral Daily Jomarie LongsSaramma Eappen, MD   150 mg at 04/09/16 0827  . propranolol (INDERAL) tablet 10 mg  10 mg Oral TID Jomarie LongsSaramma Eappen, MD   10 mg at 04/09/16 0827  . temazepam (RESTORIL) capsule 30 mg  30 mg Oral QHS Jomarie LongsSaramma Eappen, MD   30 mg at 04/08/16 2129  . traZODone (DESYREL) tablet 25 mg  25 mg Oral QHS Jomarie LongsSaramma Eappen, MD   25 mg at 04/08/16 2129    PTA Medications: Prescriptions Prior to Admission  Medication Sig Dispense Refill Last Dose  . LITHIUM PO Take 1 tablet by mouth once.   Not Taking at Unknown time    Treatment Modalities: Medication Management, Group therapy, Case management,  1 to 1 session with clinician, Psychoeducation, Recreational therapy.   Physician Treatment Plan for Primary Diagnosis: Schizoaffective disorder, bipolar type (  HCC) Long Term Goal(s): Improvement in symptoms so as ready for discharge  Short Term Goals: Compliance with prescribed medications will improve  Medication Management: Evaluate patient's response, side effects, and tolerance of medication regimen.  Therapeutic Interventions: 1 to 1 sessions, Unit Group sessions and Medication administration.  Evaluation of Outcomes: Progressing   2/15:  Will increase  Zyprexa to 20  mg po daily in divided doses. Dose adjustment to be done cautiously due to h/o QTC prolongation as well as her inability to walk , drowsiness. ( per daughter - at baseline she takes care of self and is better functioning - is not wheelchair bound) . Repeat EKG ( 03/17/16)- shows qtc- improving - however likely due to dose reduction of offending agent. EKG  ( 03/19/16)- qtc wnl. EKG -  03/25/16 - qtc - wnl . Marland Kitchen  Cogentin 0.5 mg po qhs for EPS. Will continue Depakote DR 500 mg po tid for mood sx. Depakote level on 03/18/16- 83 ( therapeutic)  Will continue Neurontin 400 mg po qhs for anxiety/restlessness. Will increase Trilpetal to 300 mg po bid - initiated yesterday at 150 mg po bid . She will receive an increase of 300 mg today and 300 mg bid tomorrow.  For insomnia: Discontinue Ambien due to lack of efficacy . Discontinue  Lunesta due to lack of efficacy , failed ambien trial , due to qtc prolongation - cannot be on some of the other sleep aids like doxepin, elavil , failed trazodone . Will increase Restoril to 30  mg po qhs today.  For hx of prolonged QTC: Repeat EKG showed qtc improving - will continue to monitor.Repeat EKG - 03/19/16- wnl .  03/23/16 - qt - 416 , qtc - 470 - will continue to monitor.Repeat EKG today- 03/25/16- qtc - wnl.  2/21:  For schizoaffective WJ:[id number] as below - no changes made today Continue Zyprexa 25 mg po daily in divided doses for psychosis/mood sx. Depakote to be changed to Depakote DR 500 mg po daily and 1000 mg po qhs . Dosing schedule changed to help her sleep better , reduce restlessness at night. Increase Neurontin to 600 mg po qhs for restlessness/anxiety sx. Will continue cogentin 1 mg po qhs for EPS. Will continue Trilpetal 150 mg po bid . Could taper it off, once she is more stable on Li . Will continue Li 150 mg po bid - to augment her depakote . Li level on 04/04/16.    Insomnia:reviewed - no changes made Will continue Restoril 30 mg po qhs - she slept few nights on the same , but last night was restless again. Add trazodone 25 mg po qhs to augment the restoril , will be cautious since she also has a hx of EKG prolongation. ( Patient failed [place] Prey , doxepin , elavil  Trials)   2/26: Continue Zyprexa 25 mg po daily in divided doses for psychosis/mood sx. Will continue Depakote DR 500 mg po daily and 1000 mg po qhs  . Dosing schedule changed to help her sleep better , reduce restlessness at night. Will continue Neurontin to 600 mg po qhs for restlessness/anxiety sx. Will continue cogentin 1 mg po qhs for EPS. Will continue Trileptal 150 mg po bid . Could taper it off, once she is more stable on Li . Continue Li 150 mg po bid to augment Depakote. Will get BMP since pt is on LI , as well has pedal edema.  3/1: Zyprexa 25 mg po daily in divided doses. Cogentin  1 mg po qhs for EPS. Depakote DR 500 mg po daily and 1000 mg po qhs. Neurontin 600 mg po qhs for restlessness. Will restart Trileptal at 150 mg po bid. Continue Li 300 mg po bid.  Last level was subtherapeutic.Next Li level on 04/11/16.    For insomnia: Restoril 30 mg po qhs. Trazodone 25 mg po qhs ( will be cautious due to h/o qtc prolongation). Patient failed Ambien, Lunesta , doxepin , elavil  Trials)    For restlessness/akathisia:reviewed as below Will increase Propranolol to 10 mg po tid.  For pedal edema: Per hospitalist recommendations - see consult note.  Physician Treatment Plan for Secondary Diagnosis: Principal Problem:   Schizoaffective disorder, bipolar type (HCC) Active Problems:   Prolonged Q-T interval on ECG   Electrolyte imbalance   Pedal edema   Long Term Goal(s): Improvement in symptoms so as ready for discharge  Short Term Goals: Ability to demonstrate self-control will improve  Medication Management: Evaluate patient's response, side effects, and tolerance of medication regimen.  Therapeutic Interventions: 1 to 1 sessions, Unit Group sessions and Medication administration.  Evaluation of Outcomes: Progressing   RN Treatment Plan for Primary Diagnosis: Schizoaffective disorder, bipolar type (HCC) Long Term Goal(s): Knowledge of disease and therapeutic regimen to maintain health will improve  Short Term Goals: Ability to demonstrate self-control and Compliance with prescribed medications will  improve  Medication Management: RN will administer medications as ordered by provider, will assess and evaluate patient's response and provide education to patient for prescribed medication. RN will report any adverse and/or side effects to prescribing provider.  Therapeutic Interventions: 1 on 1 counseling sessions, Psychoeducation, Medication administration, Evaluate responses to treatment, Monitor vital signs and CBGs as ordered, Perform/monitor CIWA, COWS, AIMS and Fall Risk screenings as ordered, Perform wound care treatments as ordered.  Evaluation of Outcomes: Progressing   LCSW Treatment Plan for Primary Diagnosis: Schizoaffective disorder, bipolar type (HCC) Long Term Goal(s): Safe transition to appropriate next level of care at discharge, Engage patient in therapeutic group addressing interpersonal concerns.  Short Term Goals: Engage patient in aftercare planning with referrals and resources and Increase skills for wellness and recovery  Therapeutic Interventions: Assess for all discharge needs, 1 to 1 time with Social worker, Explore available resources and support systems, Assess for adequacy in community support network, Educate family and significant other(s) on suicide prevention, Complete Psychosocial Assessment, Interpersonal group therapy.  Evaluation of Outcomes: Progressing   Progress in Treatment: Attending groups: Intermittently Participating in groups: Not meaningfully Taking medication as prescribed: Yes, MD continues to assess for medication changes as needed Toleration medication: Yes, no side effects reported at this time Family/Significant other contact made: Yes, Ms. Delene Loll (daughter, [phone]) Patient understands diagnosis: No, limited insight Discussing patient identified problems/goals with staff: No Medical problems stabilized or resolved: Yes, patient now up and walking out of wheelchair Denies suicidal/homicidal ideation: Yes Issues/concerns per  patient self-inventory: None Other: N/A  New problem(s) identified: discharge planning, currently homeless  New Short Term/Long Term Goal(s): Identify a safe discharge plan involving housing.    Discharge Plan or Barriers: Unknown at this time, CSW will continue to follow and assess for options regarding placement  2/12: PASSR evaluation pending at this time; CSW to facilitate search for placement. MD requesting [facility] referral be made 2/15:  PASSR evaluation on hold; pt not stabilized to the point of readiness for transfer.  Confirmation of [facility] wait list as of 2/14.  CSW attempting to contact mother to involve in dispositional  planning 2/21:  Mother is willing to have patient there, and said she would contact pt's son in [place] bout bringing her to [place].  However, pt has been heard multiple times on phone telling mother whe will not come there and is staying locally.  Pt is refusing referral to ALF.  CSW has contacted [facility] about transitional housing-will visit pt on Friday 2/26:  Ms Earlene Plater interested, but said to call back when pt is less symptomatic.  Will arrange meeting with Ms Earlene Plater, along with daughter Karma Greaser, when Dr feels pt has improved to the extent of re-evaluation.  In the meantime, Marcelle Smiling states she will reach out to her mother's previous landlord as he has not responded to my attempts to contact him. 3/1:  Marcelle Smiling came to visit this week-stated landlord is willing to take pt back if she agrees to follow rules and get a payee.  Pt unwilling to get payee, but we do have necessary paperwork to start the process.  CSW to contact Marcelle Smiling to ask her to contact landlord to negotiate.  In the meantime, Dr Elna Breslow wants her to be seen by homehealth care due to multiple mental health and medication needs.  Reason for Continuation of Hospitalization: Anxiety Delusions  Medication stabilization Disorganization    Estimated Length of Stay: 3-5 days  Attendees: Patient:  04/09/2016   11:15 AM  Physician: Dr. Elna Breslow 04/09/2016  11:15 AM  Nursing: Esperanza Sheets, RN  04/09/2016  11:15 AM  RN Care Manager: Onnie Boer, CM RN 04/09/2016  11:15 AM  Social Worker: Richelle Ito LCSW 04/09/2016  11:15 AM  Recreational Therapist: Royston Cowper, RT 04/09/2016  11:15 AM  Other:  04/09/2016  11:15 AM  Other:  04/09/2016  11:15 AM  Other: 04/09/2016  11:15 AM    Scribe for Treatment Team: Richelle Ito, LCSW Clinical Social Work [phone]

## 2016-04-09 NOTE — Progress Notes (Signed)
D: Pt continues to be loud and needing constant redirection. Pt continues to be labile and talk with pressured speech.   A: Pt was offered support and encouragement. Pt was given scheduled medications. Pt was encourage to attend groups. Q 15 minute checks were done for safety.   R:Pt attends groups and interacts well with peers and staff. Pt is taking medication. Pt receptive to treatment and safety maintained on unit.

## 2016-04-09 NOTE — Plan of Care (Signed)
Problem: Safety: Goal: Ability to redirect hostility and anger into socially appropriate behaviors will improve Outcome: Progressing Pt is redirectable at this time

## 2016-04-09 NOTE — Progress Notes (Signed)
Recreation Therapy Notes  Date: 04/09/16 Time: 1000 Location: 500 Hall Dayroom  Group Topic: Stress Management  Goal Area(s) Addresses:  Patient will verbalize importance of using healthy stress management.  Patient will identify positive emotions associated with healthy stress management.   Behavioral Response: Minimal  Intervention: Stress Management  Activity :  Deep Breathing, Guided Imagery.  LRT introduced the stress management techniques of deep breathing and guided imagery.  LRT read scripts to guide patients through the techniques and explain the purpose of each technique.  Patients were to follow along as the scripts were read to participate in each technique.   Education:  Stress Management, Discharge Planning.   Education Outcome: Acknowledges edcuation/In group clarification offered/Needs additional education  Clinical Observations/Feedback: Pt stated one of the affects of stress was "people driving you crazy."  Pt was unable to focus during group.  Pt left and did not return.    Caroll RancherMarjette Colleen Kotlarz, LRT/CTRS      Caroll RancherLindsay, Kieu Quiggle A 04/09/2016 11:19 AM

## 2016-04-09 NOTE — Progress Notes (Signed)
Patient has been agitated, hyper-verbal, threatening.  Patient refused to return to the 500 unit to after patient's left for dinner.  Patient has been unable to follow any staff direction and has been hesitant to take medications.   Assess patient for safety, offer medications as prescribed, engage patient in 1:1 staff talks.   Continue to monitor as prescribed.

## 2016-04-09 NOTE — Progress Notes (Signed)
Adult Psychoeducational Group Note  Date:  04/09/2016 Time:  8:53 PM  Group Topic/Focus:  Wrap-Up Group:   The focus of this group is to help patients review their daily goal of treatment and discuss progress on daily workbooks.  Participation Level:  Active  Participation Quality:  Appropriate  Affect:  Appropriate  Cognitive:  Appropriate  Insight: Appropriate  Engagement in Group:  Engaged  Modes of Intervention:  Discussion  Additional Comments: The patient expressed that she ha d a good day.The patient also said that attended group.  Octavio Mannshigpen, Cathryne Mancebo Lee 04/09/2016, 8:53 PM

## 2016-04-09 NOTE — Progress Notes (Signed)
[facility] MD Progress Note  04/09/2016 2:50 PM Catherine Bender  MRN:  [id number]   Subjective: Pt states " I am fine. I want to get out of here ."     .    Objective:Patient seen and chart reviewed.Discussed patient with treatment team.  Pt today seen as labile often, requiring redirection. Pt seen as making random irrelevant delusional statements like 'My mother will pay you 1 million dollars to get me out of here.' Pt requires constant redirection on the unit. Pt had a fall last night , however denied any complaints , observed as not having any concerns. Pt is alert , oriented x3. Sleep and appetite are fair. Denies ADRs.        Principal Problem: Schizoaffective disorder, bipolar type (HCC) Diagnosis:   Patient Active Problem List   Diagnosis Date Noted  . Pedal edema [R60.0] 04/06/2016  . Electrolyte imbalance [E87.8] 03/27/2016  . Prolonged Q-T interval on ECG [R94.31] 03/13/2016  . Schizoaffective disorder, bipolar type (HCC) [F25.0] 03/12/2016  . Diabetes mellitus (HCC) [E11.9] 03/12/2016   Total Time spent with patient: 25 minutes  Past Psychiatric History: Please see H&P.   Past Medical History:  Past Medical History:  Diagnosis Date  . Diabetes mellitus without complication [facility])     Past Surgical History:  Procedure Laterality Date  . umbical hernia repair     Family History: Please see H&P.  Family Psychiatric  History: Please see H&P.  Social History: Please see H&P.  History  Alcohol Use No     History  Drug Use No    Social History   Social History  . Marital status: Single    Spouse name: N/A  . Number of children: N/A  . Years of education: N/A   Social History Main Topics  . Smoking status: Current Every Day Smoker    Packs/day: 0.25  . Smokeless tobacco: Never Used  . Alcohol use No  . Drug use: No  . Sexual activity: Not Asked   Other Topics Concern  . None   Social History Narrative  . None   Additional Social History:    Sleep: Fair Improving  Appetite:  Fair, Improving  Current Medications: Current Facility-Administered Medications  Medication Dose Route Frequency Provider Last Rate Last Dose  . acetaminophen (TYLENOL) tablet 650 mg  650 mg Oral Q6H PRN Kerry Hough, PA-C   650 mg at 04/09/16 1011  . alum & mag hydroxide-simeth (MAALOX/MYLANTA) 200-200-20 MG/5ML suspension 30 mL  30 mL Oral Q4H PRN Kerry Hough, PA-C   30 mL at 03/19/16 0021  . benztropine (COGENTIN) tablet 1 mg  1 mg Oral QHS Jomarie Longs, MD   1 mg at 04/08/16 2129  . divalproex (DEPAKOTE) DR tablet 1,000 mg  1,000 mg Oral QHS Jomarie Longs, MD   1,000 mg at 04/08/16 2129  . divalproex (DEPAKOTE) DR tablet 500 mg  500 mg Oral QPC breakfast Jomarie Longs, MD   500 mg at 04/09/16 0828  . gabapentin (NEURONTIN) capsule 600 mg  600 mg Oral QHS Jomarie Longs, MD   600 mg at 04/08/16 2129  . insulin aspart (novoLOG) injection 0-15 Units  0-15 Units Subcutaneous TID WC Jomarie Longs, MD   2 Units at 04/06/16 1724  . insulin aspart (novoLOG) injection 0-5 Units  0-5 Units Subcutaneous QHS Sophea Rackham, MD      . lithium carbonate capsule 300 mg  300 mg Oral BID WC Sola Margolis, MD   300 mg at  04/09/16 1610  . LORazepam (ATIVAN) tablet 1 mg  1 mg Oral Q6H PRN Jomarie Longs, MD   1 mg at 04/08/16 1615   Or  . LORazepam (ATIVAN) injection 1 mg  1 mg Intramuscular Q6H PRN Jomarie Longs, MD      . magnesium hydroxide (MILK OF MAGNESIA) suspension 30 mL  30 mL Oral Daily PRN Kerry Hough, PA-C      . magnesium oxide (MAG-OX) tablet 400 mg  400 mg Oral Daily Jomarie Longs, MD   400 mg at 04/09/16 0828  . nicotine polacrilex (NICORETTE) gum 2 mg  2 mg Oral PRN Jomarie Longs, MD      . OLANZapine zydis (ZYPREXA) disintegrating tablet 20 mg  20 mg Oral QHS Jomarie Longs, MD   20 mg at 04/08/16 2129  . OLANZapine zydis (ZYPREXA) disintegrating tablet 5 mg  5 mg Oral Daily Jomarie Longs, MD   5 mg at 04/09/16 0828  . OXcarbazepine  (TRILEPTAL) tablet 150 mg  150 mg Oral BID Jomarie Longs, MD      . propranolol (INDERAL) tablet 10 mg  10 mg Oral TID Jomarie Longs, MD   10 mg at 04/09/16 1345  . temazepam (RESTORIL) capsule 30 mg  30 mg Oral QHS Jomarie Longs, MD   30 mg at 04/08/16 2129  . traZODone (DESYREL) tablet 25 mg  25 mg Oral QHS Jomarie Longs, MD   25 mg at 04/08/16 2129    Lab Results:  Results for orders placed or performed during the hospital encounter of 03/11/16 (from the past 48 hour(s))  Glucose, capillary     Status: Abnormal   Collection Time: 04/07/16  5:08 PM  Result Value Ref Range   Glucose-Capillary 101 (H) 65 - 99 mg/dL  Brain natriuretic peptide     Status: None   Collection Time: 04/07/16  6:12 PM  Result Value Ref Range   B Natriuretic Peptide 83.7 0.0 - 100.0 pg/mL    Comment: Performed at [facility], [address] [place], [place] [address]  Hepatic function panel     Status: Abnormal   Collection Time: 04/07/16  6:12 PM  Result Value Ref Range   Total Protein 7.1 6.5 - 8.1 g/dL   Albumin 3.5 3.5 - 5.0 g/dL   AST 19 15 - 41 U/L   ALT 14 14 - 54 U/L   Alkaline Phosphatase 59 38 - 126 U/L   Total Bilirubin 0.3 0.3 - 1.2 mg/dL   Bilirubin, Direct <4.0 (L) 0.1 - 0.5 mg/dL   Indirect Bilirubin NOT CALCULATED 0.3 - 0.9 mg/dL    Comment: Performed at [facility], [address], [place], [place] [address]  Glucose, capillary     Status: None   Collection Time: 04/07/16  8:40 PM  Result Value Ref Range   Glucose-Capillary 91 65 - 99 mg/dL  Glucose, capillary     Status: Abnormal   Collection Time: 04/08/16  5:51 AM  Result Value Ref Range   Glucose-Capillary 118 (H) 65 - 99 mg/dL   Comment 1 Notify RN   Glucose, capillary     Status: None   Collection Time: 04/08/16 12:00 PM  Result Value Ref Range   Glucose-Capillary 85 65 - 99 mg/dL   Comment 1 Notify RN    Comment 2 Document in Chart   Glucose, capillary     Status: Abnormal    Collection Time: 04/08/16  4:45 PM  Result Value Ref Range  Glucose-Capillary 112 (H) 65 - 99 mg/dL   Comment 1 Notify RN    Comment 2 Document in Chart   Glucose, capillary     Status: Abnormal   Collection Time: 04/08/16  8:38 PM  Result Value Ref Range   Glucose-Capillary 107 (H) 65 - 99 mg/dL  Glucose, capillary     Status: Abnormal   Collection Time: 04/09/16  2:55 AM  Result Value Ref Range   Glucose-Capillary 109 (H) 65 - 99 mg/dL  Glucose, capillary     Status: None   Collection Time: 04/09/16  6:21 AM  Result Value Ref Range   Glucose-Capillary 99 65 - 99 mg/dL  Glucose, capillary     Status: Abnormal   Collection Time: 04/09/16 11:44 AM  Result Value Ref Range   Glucose-Capillary 106 (H) 65 - 99 mg/dL    Blood Alcohol level:  Lab Results  Component Value Date   ETH <5 03/11/2016   ETH <5 01/05/2016    Metabolic Disorder Labs: Lab Results  Component Value Date   HGBA1C 5.2 03/13/2016   MPG 103 03/13/2016   Lab Results  Component Value Date   PROLACTIN 27.9 (H) 03/13/2016   Lab Results  Component Value Date   CHOL 150 03/13/2016   TRIG 69 03/13/2016   HDL 43 03/13/2016   CHOLHDL 3.5 03/13/2016   VLDL 14 03/13/2016   LDLCALC 93 03/13/2016    Physical Findings: AIMS: Facial and Oral Movements Muscles of Facial Expression: None, normal Lips and Perioral Area: None, normal Jaw: None, normal Tongue: None, normal,Extremity Movements Upper (arms, wrists, hands, fingers): None, normal Lower (legs, knees, ankles, toes): None, normal, Trunk Movements Neck, shoulders, hips: None, normal, Overall Severity Severity of abnormal movements (highest score from questions above): None, normal Incapacitation due to abnormal movements: None, normal Patient's awareness of abnormal movements (rate only patient's report): No Awareness, Dental Status Current problems with teeth and/or dentures?: No Does patient usually wear dentures?: No  CIWA:  CIWA-Ar Total: 1 COWS:   COWS Total Score: 2  Musculoskeletal: Strength & Muscle Tone: within normal limits Gait & Station: normal Patient leans: N/A  Psychiatric Specialty Exam: Physical Exam  Nursing note and vitals reviewed.   Review of Systems  Psychiatric/Behavioral: The patient is nervous/anxious.   All other systems reviewed and are negative.   Blood pressure (!) 152/86, pulse 72, temperature 98.5 F (36.9 C), resp. rate 18, height 5\' 5"  (1.651 m), weight 92.5 kg (204 lb), SpO2 99 %.Body mass index is 33.95 kg/m.  General Appearance: Fairly Groomed  Eye Contact:  Fair  Speech:  Pressured  Volume:  loud on and off   Mood:  labile at times  Affect:  congruent  Thought Process:  Linear and Descriptions of Associations: Circumstantial   Orientation:  Full (Time, Place, and Person)  Thought Content:  Delusions and Rumination, random delusional statements - trump is my cousin  Suicidal Thoughts:  No  Homicidal Thoughts:  No  Memory:  Immediate;   Fair Recent;   Fair Remote;   Poor  Judgement:  Impaired  Insight:  Shallow  Psychomotor Activity:  Restlessness  Concentration:  Concentration: Fair and Attention Span: Fair  Recall:  [employer]  Fund of Knowledge:  Fair  Language:  Fair  Akathisia:  No  Handed:  Right  AIMS (if indicated):     Assets:  Desire for Improvement Talents/Skills  ADL's:  Intact  Cognition:  WNL  Sleep:  Number of Hours: 5.5   Schizoaffective disorder,  bipolar type (HCC) unstable - improving  Will continue today 04/09/16  plan as below except where it is noted.        Treatment plan: Patient today seen as pressured often , requires redirection. Pt continues to make irrelevant statements , but overall made progress since she is sleeping more, she is not confused as she were on admission, is alert , oriented , is steady , does not need assistance to take care of ADLs. Will continue to encourage and support.    Daily contact with patient to assess and evaluate  symptoms and progress in treatment and Medication management    For schizoaffective do: Zyprexa 25 mg po daily in divided doses. Cogentin 1 mg po qhs for EPS. Depakote DR 500 mg po daily and 1000 mg po qhs. Neurontin 600 mg po qhs for restlessness. Will restart Trileptal at 150 mg po bid. Continue Li 300 mg po bid.  Last level was subtherapeutic.Next Li level on 04/11/16.    For insomnia: Restoril 30 mg po qhs. Trazodone 25 mg po qhs ( will be cautious due to h/o qtc prolongation). Patient failed Ambien, Lunesta , doxepin , elavil  Trials)    For restlessness/akathisia:reviewed as below Will increase Propranolol to 10 mg po tid.  For pedal edema: Per hospitalist recommendations - see consult note. BMP reviewed - BUN/cr - wnl, K+ - 3.6 , mg- 1.9. Elevate leg. Will continue to monitor.   H/O prolonged QTC on EKG and she is also on Lithium: Repeat EKG - shows qtc - 456, nonspecific T wave abnormality/NSR.   Given PPD test  03/19/16 - for ALF placement- - negative .  DM: Will continue current medication regimen.   Pt referred to [facility] . Pt also could be discharged back to her boarding home - if she improves. Family meeting was conducted on 04/07/16.   Patient to participate in therapeutic milieu   Juan Olthoff, MD 04/09/2016, 2:50 PM

## 2016-04-10 LAB — COMPREHENSIVE METABOLIC PANEL
ALBUMIN: 3.3 g/dL — AB (ref 3.5–5.0)
ALT: 13 U/L — ABNORMAL LOW (ref 14–54)
AST: 19 U/L (ref 15–41)
Alkaline Phosphatase: 56 U/L (ref 38–126)
Anion gap: 8 (ref 5–15)
BUN: 21 mg/dL — AB (ref 6–20)
CHLORIDE: 97 mmol/L — AB (ref 101–111)
CO2: 31 mmol/L (ref 22–32)
Calcium: 8.7 mg/dL — ABNORMAL LOW (ref 8.9–10.3)
Creatinine, Ser: 0.96 mg/dL (ref 0.44–1.00)
GFR calc Af Amer: >60 mL/min (ref >60)
Glucose, Bld: 114 mg/dL — ABNORMAL HIGH (ref 65–99)
POTASSIUM: 3.8 mmol/L (ref 3.5–5.1)
SODIUM: 136 mmol/L (ref 135–145)
Total Bilirubin: 0.3 mg/dL (ref 0.3–1.2)
Total Protein: 7 g/dL (ref 6.5–8.1)

## 2016-04-10 LAB — CBC
HCT: 33.9 % — ABNORMAL LOW (ref 36.0–46.0)
Hemoglobin: 11.1 g/dL — ABNORMAL LOW (ref 12.0–15.0)
MCH: 29.7 pg (ref 26.0–34.0)
MCHC: 32.7 g/dL (ref 30.0–36.0)
MCV: 90.6 fL (ref 78.0–100.0)
PLATELETS: 313 10*3/uL (ref 150–400)
RBC: 3.74 MIL/uL — ABNORMAL LOW (ref 3.87–5.11)
RDW: 15.7 % — AB (ref 11.5–15.5)
WBC: 14 10*3/uL — AB (ref 4.0–10.5)

## 2016-04-10 LAB — GLUCOSE, CAPILLARY
GLUCOSE-CAPILLARY: 108 mg/dL — AB (ref 65–99)
GLUCOSE-CAPILLARY: 59 mg/dL — AB (ref 65–99)
GLUCOSE-CAPILLARY: 64 mg/dL — AB (ref 65–99)
GLUCOSE-CAPILLARY: 79 mg/dL (ref 65–99)
GLUCOSE-CAPILLARY: 148 mg/dL — AB (ref 65–99)
Glucose-Capillary: 143 mg/dL — ABNORMAL HIGH (ref 65–99)

## 2016-04-10 LAB — AMMONIA: Ammonia: 82 umol/L — ABNORMAL HIGH (ref 9–35)

## 2016-04-10 LAB — LITHIUM LEVEL: LITHIUM LVL: 0.46 mmol/L — AB (ref 0.60–1.20)

## 2016-04-10 MED ORDER — POTASSIUM CHLORIDE CRYS ER 20 MEQ PO TBCR
40.0000 meq | EXTENDED_RELEASE_TABLET | Freq: Once | ORAL | Status: AC
  Administered 2016-04-10: 40 meq via ORAL
  Filled 2016-04-10: qty 2

## 2016-04-10 MED ORDER — LORAZEPAM 1 MG PO TABS
1.0000 mg | ORAL_TABLET | Freq: Once | ORAL | Status: AC
  Administered 2016-04-10: 1 mg via ORAL
  Filled 2016-04-10: qty 1

## 2016-04-10 MED ORDER — POTASSIUM CHLORIDE CRYS ER 20 MEQ PO TBCR
40.0000 meq | EXTENDED_RELEASE_TABLET | Freq: Once | ORAL | Status: AC
  Administered 2016-04-12: 40 meq via ORAL
  Filled 2016-04-10: qty 2

## 2016-04-10 NOTE — Progress Notes (Signed)
Patient has been hyper-verbal, intrusive and in need of much redirection. Patient continues with delusional speech stating she was in the [employer], she has millions of dollars and that she owns the hospital.  Patient was noted to be drowsy but refused to rest.  Patient is labile and irritable.   Assess patient for safety, offer medications as prescribed, engage patient in 1:1 staff talks.  Continue to monitor as planned. Patient able to contract for safety.

## 2016-04-10 NOTE — Progress Notes (Signed)
Recreation Therapy Notes  Date: 04/10/16 Time: 1000 Location: 500 Hall Dayroom  Group Topic: Self-Esteem  Goal Area(s) Addresses:  Patient will identify positive ways to increase self-esteem. Patient will verbalize benefit of increased self-esteem.  Intervention: Sheet with a blank mask, colored pencils  Activity: How I See Me.  Patiens were given a blank mask.  Patients were to draw a picture of how they see themselves. Once patients were finished with the picture, they were write on the outside of the picture their positive qualities.  Education:  Self-Esteem, Discharge Planning.   Education Outcome: Acknowledges education/In group clarification offered/Needs additional education  Clinical Observations/Feedback: Pt did not attend group.    Syrah Daughtrey, LRT/CTRS         Tyshawn Keel A 04/10/2016 12:09 PM 

## 2016-04-10 NOTE — Progress Notes (Signed)
Hypoglycemic Event  CBG: 64  Treatment: 15 GM carbohydrate snack  Symptoms: None  Follow-up CBG: Time: 659 CBG Result:59  Treatment: 15 GM carbohydrate snack  Follow-up CBG: Time:708 CBG Result: 108  Possible Reasons for Event: Inadequate meal intake  Comments/MD notified:continue to monitor    Catherine Bender, Corene Resnick A

## 2016-04-10 NOTE — Progress Notes (Signed)
[facility] MD Progress Note  04/10/2016 12:50 PM Catherine Bender  MRN:  [id number]   Subjective: Pt states " I am going to sue him.'   Objective:Patient seen and chart reviewed.Discussed patient with treatment team.  Pt today went out of the unit without permission, and had to be brought back in . Pt was held by an MHT in an attempt to bring her back in to the unit . Pt has been emotionally attached to this particular MHT , however today did not like it when he forced her to return to the unit , was seen as loud , yelling , cursing the MHT for holding her , threatening to sue him and accusing him of calling her ugly and so on. Pt later on calmed down , returned to the unit. PRN medications were offered. Pt continues to have on and off periods when she is labile on the unit. Pt is on three mood stabilizers along with zyprexa. Pt is sleeping better . Pt also has medical issues which is being managed by hospitalist.Will continue to observe on the unit.          Principal Problem: Schizoaffective disorder, bipolar type (HCC) Diagnosis:   Patient Active Problem List   Diagnosis Date Noted  . Agitation [R45.1]   . Change in behavior [R46.89]   . Pedal edema [R60.0] 04/06/2016  . Electrolyte imbalance [E87.8] 03/27/2016  . Prolonged Q-T interval on ECG [R94.31] 03/13/2016  . Schizoaffective disorder, bipolar type (HCC) [F25.0] 03/12/2016  . Diabetes mellitus (HCC) [E11.9] 03/12/2016   Total Time spent with patient: 30 minutes  Past Psychiatric History: Please see H&P.   Past Medical History:  Past Medical History:  Diagnosis Date  . Diabetes mellitus without complication [facility](HCC)     Past Surgical History:  Procedure Laterality Date  . umbical hernia repair     Family History: Please see H&P.  Family Psychiatric  History: Please see H&P.  Social History: Please see H&P.  History  Alcohol Use No     History  Drug Use No    Social History   Social History  . Marital status:  Single    Spouse name: N/A  . Number of children: N/A  . Years of education: N/A   Social History Main Topics  . Smoking status: Current Every Day Smoker    Packs/day: 0.25  . Smokeless tobacco: Never Used  . Alcohol use No  . Drug use: No  . Sexual activity: Not Asked   Other Topics Concern  . None   Social History Narrative  . None   Additional Social History:   Sleep: Fair Improving  Appetite:  Fair, Improving  Current Medications: Current Facility-Administered Medications  Medication Dose Route Frequency Provider Last Rate Last Dose  . acetaminophen (TYLENOL) tablet 650 mg  650 mg Oral Q6H PRN Kerry HoughSpencer E Simon, PA-C   650 mg at 04/10/16 0810  . alum & mag hydroxide-simeth (MAALOX/MYLANTA) 200-200-20 MG/5ML suspension 30 mL  30 mL Oral Q4H PRN Kerry HoughSpencer E Simon, PA-C   30 mL at 03/19/16 0021  . benztropine (COGENTIN) tablet 1 mg  1 mg Oral QHS Jomarie LongsSaramma Yetunde Leis, MD   1 mg at 04/09/16 2142  . diphenhydrAMINE (BENADRYL) injection 50 mg  50 mg Intramuscular Once Laveda AbbeLaurie Britton Parks, NP      . divalproex (DEPAKOTE) DR tablet 1,000 mg  1,000 mg Oral QHS Jomarie LongsSaramma Maicey Barrientez, MD   1,000 mg at 04/09/16 2142  . divalproex (DEPAKOTE) DR tablet 500 mg  500 mg Oral QPC breakfast Jomarie Longs, MD   500 mg at 04/10/16 0804  . furosemide (LASIX) tablet 40 mg  40 mg Oral Daily Richarda Overlie, MD   40 mg at 04/10/16 0807  . gabapentin (NEURONTIN) capsule 600 mg  600 mg Oral QHS Serigne Kubicek, MD   600 mg at 04/09/16 2142  . haloperidol (HALDOL) tablet 10 mg  10 mg Oral Once Laveda Abbe, NP       Or  . haloperidol lactate (HALDOL) injection 10 mg  10 mg Intramuscular Once Laveda Abbe, NP      . insulin aspart (novoLOG) injection 0-15 Units  0-15 Units Subcutaneous TID WC Jomarie Longs, MD   2 Units at 04/06/16 1724  . insulin aspart (novoLOG) injection 0-5 Units  0-5 Units Subcutaneous QHS Narvel Kozub, MD      . lithium carbonate capsule 300 mg  300 mg Oral BID WC Jomarie Longs, MD   300 mg at 04/10/16 0804  . LORazepam (ATIVAN) tablet 1 mg  1 mg Oral Q6H PRN Jomarie Longs, MD   1 mg at 04/10/16 4098   Or  . LORazepam (ATIVAN) injection 1 mg  1 mg Intramuscular Q6H PRN Jomarie Longs, MD      . magnesium hydroxide (MILK OF MAGNESIA) suspension 30 mL  30 mL Oral Daily PRN Kerry Hough, PA-C      . magnesium oxide (MAG-OX) tablet 400 mg  400 mg Oral BID Richarda Overlie, MD   400 mg at 04/10/16 0805  . nicotine polacrilex (NICORETTE) gum 2 mg  2 mg Oral PRN Jomarie Longs, MD      . OLANZapine zydis (ZYPREXA) disintegrating tablet 20 mg  20 mg Oral QHS Jomarie Longs, MD   20 mg at 04/09/16 2142  . OLANZapine zydis (ZYPREXA) disintegrating tablet 5 mg  5 mg Oral Daily Jomarie Longs, MD   5 mg at 04/10/16 0805  . OXcarbazepine (TRILEPTAL) tablet 150 mg  150 mg Oral BID Jomarie Longs, MD   150 mg at 04/10/16 0805  . propranolol (INDERAL) tablet 10 mg  10 mg Oral TID Jomarie Longs, MD   10 mg at 04/10/16 0805  . temazepam (RESTORIL) capsule 30 mg  30 mg Oral QHS Jomarie Longs, MD   30 mg at 04/09/16 2142  . traZODone (DESYREL) tablet 25 mg  25 mg Oral QHS Jomarie Longs, MD   25 mg at 04/09/16 2142    Lab Results:  Results for orders placed or performed during the hospital encounter of 03/11/16 (from the past 48 hour(s))  Glucose, capillary     Status: Abnormal   Collection Time: 04/08/16  4:45 PM  Result Value Ref Range   Glucose-Capillary 112 (H) 65 - 99 mg/dL   Comment 1 Notify RN    Comment 2 Document in Chart   Glucose, capillary     Status: Abnormal   Collection Time: 04/08/16  8:38 PM  Result Value Ref Range   Glucose-Capillary 107 (H) 65 - 99 mg/dL  Glucose, capillary     Status: Abnormal   Collection Time: 04/09/16  2:55 AM  Result Value Ref Range   Glucose-Capillary 109 (H) 65 - 99 mg/dL  Glucose, capillary     Status: None   Collection Time: 04/09/16  6:21 AM  Result Value Ref Range   Glucose-Capillary 99 65 - 99 mg/dL  Glucose, capillary      Status: Abnormal   Collection Time: 04/09/16 11:44 AM  Result  Value Ref Range   Glucose-Capillary 106 (H) 65 - 99 mg/dL  Glucose, capillary     Status: Abnormal   Collection Time: 04/09/16  5:11 PM  Result Value Ref Range   Glucose-Capillary 128 (H) 65 - 99 mg/dL   Comment 1 Notify RN    Comment 2 Document in Chart   Glucose, capillary     Status: Abnormal   Collection Time: 04/09/16  8:31 PM  Result Value Ref Range   Glucose-Capillary 105 (H) 65 - 99 mg/dL  Glucose, capillary     Status: Abnormal   Collection Time: 04/10/16  6:47 AM  Result Value Ref Range   Glucose-Capillary 64 (L) 65 - 99 mg/dL  Glucose, capillary     Status: Abnormal   Collection Time: 04/10/16  6:58 AM  Result Value Ref Range   Glucose-Capillary 59 (L) 65 - 99 mg/dL  Glucose, capillary     Status: Abnormal   Collection Time: 04/10/16  7:08 AM  Result Value Ref Range   Glucose-Capillary 108 (H) 65 - 99 mg/dL  Glucose, capillary     Status: None   Collection Time: 04/10/16 12:16 PM  Result Value Ref Range   Glucose-Capillary 79 65 - 99 mg/dL    Blood Alcohol level:  Lab Results  Component Value Date   ETH <5 03/11/2016   ETH <5 01/05/2016    Metabolic Disorder Labs: Lab Results  Component Value Date   HGBA1C 5.2 03/13/2016   MPG 103 03/13/2016   Lab Results  Component Value Date   PROLACTIN 27.9 (H) 03/13/2016   Lab Results  Component Value Date   CHOL 150 03/13/2016   TRIG 69 03/13/2016   HDL 43 03/13/2016   CHOLHDL 3.5 03/13/2016   VLDL 14 03/13/2016   LDLCALC 93 03/13/2016    Physical Findings: AIMS: Facial and Oral Movements Muscles of Facial Expression: None, normal Lips and Perioral Area: None, normal Jaw: None, normal Tongue: None, normal,Extremity Movements Upper (arms, wrists, hands, fingers): None, normal Lower (legs, knees, ankles, toes): None, normal, Trunk Movements Neck, shoulders, hips: None, normal, Overall Severity Severity of abnormal movements (highest  score from questions above): None, normal Incapacitation due to abnormal movements: None, normal Patient's awareness of abnormal movements (rate only patient's report): No Awareness, Dental Status Current problems with teeth and/or dentures?: No Does patient usually wear dentures?: No  CIWA:  CIWA-Ar Total: 1 COWS:  COWS Total Score: 2  Musculoskeletal: Strength & Muscle Tone: within normal limits Gait & Station: normal Patient leans: N/A  Psychiatric Specialty Exam: Physical Exam  Nursing note and vitals reviewed.   Review of Systems  Psychiatric/Behavioral: The patient is nervous/anxious.   All other systems reviewed and are negative.   Blood pressure (!) 137/96, pulse (!) 101, temperature 99.1 F (37.3 C), temperature source Oral, resp. rate 18, height 5\' 5"  (1.651 m), weight 92.5 kg (204 lb), SpO2 99 %.Body mass index is 33.95 kg/m.  General Appearance: Fairly Groomed  Eye Contact:  Fair  Speech:  Pressured  Volume:  loud on and off   Mood:  labile at times  Affect:  congruent  Thought Process:  Linear and Descriptions of Associations: Circumstantial   Orientation:  Full (Time, Place, and Person)  Thought Content:  Delusions and Rumination random delusional statements like Donn Pierini is my cousin.ruminates about how her family is stealing her money  Suicidal Thoughts:  No  Homicidal Thoughts:  No  Memory:  Immediate;   Fair Recent;   Fair Remote;  Poor  Judgement:  Impaired  Insight:  Shallow  Psychomotor Activity:  Restlessness  Concentration:  Concentration: Fair and Attention Span: Fair  Recall:  [employer] of Knowledge:  Fair  Language:  Fair  Akathisia:  No  Handed:  Right  AIMS (if indicated):     Assets:  Desire for Improvement Talents/Skills  ADL's:  Intact  Cognition:  WNL  Sleep:  Number of Hours: 6.25    Schizoaffective disorder, bipolar type (HCC) unstable - improving   Will continue today 04/10/16  plan as below except where it is  noted.   Treatment plan: Patient today seen as labile , tried to walk out of the unit , was loud and threatening when attempted to be brought back. Pt continues to ruminate about her family stealing money and how her grandson is being emotionally abused . Pt does not do well when she makes calls to her family, especially her mother - during and right after calls to family - she is agitated and upset. Pt is alert, not confused , is sleeping better.Pt does well with certain staff , is seen as having good times often on the unit .  Pt is also being followed by hospitalist for her medical issues. Labs ordered by hospitalist yesterday were not collected , will reorder and signout to weekend team.   Daily contact with patient to assess and evaluate symptoms and progress in treatment and Medication management   No changes made today - will await Li level - pending for tomorrow - 04/11/16.  For schizoaffective do: Zyprexa 25 mg po daily in divided doses. Cogentin 1 mg po qhs for EPS. Depakote DR 500 mg po daily and 1000 mg po qhs. Neurontin 600 mg po qhs for restlessness. Will restart Trileptal at 150 mg po bid. Continue Li 300 mg po bid.  Last level was subtherapeutic.Next Li level on 04/11/16.    For insomnia: Restoril 30 mg po qhs. Trazodone 25 mg po qhs ( will be cautious due to h/o qtc prolongation). Patient failed Ambien, Lunesta , doxepin , elavil  Trials)    For restlessness/akathisia:reviewed as below Will increase Propranolol to 10 mg po tid.  For pedal edema: As per hospitalist recommendations - will reorder labs - since they were not collected this AM. Will signout to weekend staff.   H/O prolonged QTC on EKG and she is also on Lithium: Repeat EKG - shows qtc - 456, nonspecific T wave abnormality/NSR.   Given PPD test  03/19/16 - for ALF placement- - negative .  DM: Will continue current medication regimen.  Home health consult placed- pending.  CSW will work on  referral to day program / TCT.  Pt referred to [facility] . Pt also could be discharged back to her boarding home - if she improves. Family meeting was conducted on 04/07/16.   Patient to participate in therapeutic milieu   Homer Miller, MD 04/10/2016, 12:50 PM

## 2016-04-10 NOTE — [facility] Group Notes (Signed)
[facility] LCSW Group Therapy  04/10/2016  1:05 PM  Type of Therapy:  Group therapy  Participation Level:  Active  Participation Quality:  Attentive  Affect:  Flat  Cognitive:  Oriented  Insight:  Limited  Engagement in Therapy:  Limited  Modes of Intervention:  Discussion, Socialization  Summary of Progress/Problems:  Chaplain was here to lead a group on themes of hope and courage.  Disrupted us twice by coming in and announcing her presence, but on her way out, stated she needed to go lay down.  Did not return.  [name], Garrett Bowring B 04/10/2016 1:39 PM

## 2016-04-10 NOTE — Progress Notes (Signed)
Internal Medicine follow up note.  Patient seen and examined.  Patient with significant lower extremity edema.   1- Lower extremity edema;  Started on lasix 3-01.  Await labs results to further adjust meds.  Add KCl.   2-prolong QT; defer to psych team adjustment of psych meds.  continue with magnesium supplements.  Replete k.   3-HTN; on lasix, propanolol.   Catherine Bracknell, Md.

## 2016-04-11 DIAGNOSIS — G4700 Insomnia, unspecified: Secondary | ICD-10-CM

## 2016-04-11 LAB — GLUCOSE, CAPILLARY
Glucose-Capillary: 77 mg/dL (ref 65–99)
Glucose-Capillary: 99 mg/dL (ref 65–99)
Glucose-Capillary: 123 mg/dL — ABNORMAL HIGH (ref 65–99)
Glucose-Capillary: 144 mg/dL — ABNORMAL HIGH (ref 65–99)

## 2016-04-11 MED ORDER — POTASSIUM CHLORIDE CRYS ER 20 MEQ PO TBCR
40.0000 meq | EXTENDED_RELEASE_TABLET | Freq: Every day | ORAL | Status: DC
  Administered 2016-04-13 – 2016-04-16 (×4): 40 meq via ORAL
  Filled 2016-04-11 (×3): qty 2
  Filled 2016-04-11: qty 14
  Filled 2016-04-11 (×4): qty 2

## 2016-04-11 MED ORDER — LACTULOSE 10 GM/15ML PO SOLN
30.0000 g | Freq: Three times a day (TID) | ORAL | Status: DC
  Administered 2016-04-11 – 2016-04-12 (×3): 30 g via ORAL
  Filled 2016-04-11 (×9): qty 45

## 2016-04-11 MED ORDER — LACTULOSE 10 GM/15ML PO SOLN
30.0000 g | Freq: Two times a day (BID) | ORAL | Status: DC
  Filled 2016-04-11 (×2): qty 45

## 2016-04-11 NOTE — [facility] Group Notes (Signed)
[facility] Group Notes: (Clinical Social Work)   04/11/2016      Type of Therapy:  Group Therapy   Participation Level:  Did Not Attend - came into room while another patient was sharing and started talking over him, refused to stop.  CSW got MHT to come remove her from room and not allow her back.   Ambrose MantleMareida Grossman-Orr, LCSW 04/11/2016, 12:56 PM

## 2016-04-11 NOTE — Progress Notes (Signed)
D: Pt continue to be loud, intrusive and delusional. Pt denies depression, anxiety, SI, HI or AVH; state; "I should be the one asking you these questions since I work here." Pt complained about moderate bilateral foot pain-see MAR. Pt remained nonviolent.  A: Medications offered as prescribed.All patient's questions and concerns were addressed. Support, encouragement, and safe environment provided. 15-minute safety checks continue.  R: Pt was med compliant. Did not attend group. Safety checks continue

## 2016-04-11 NOTE — Progress Notes (Signed)
Internal Medicine follow up note.  Patient seen and examined.  Patient with significant lower extremity edema.   1- Lower extremity edema;  Started on lasix 3-01.  Continue with current dose.  Will add daily K supplement.   2-prolong QT; defer to psych team adjustment of psych meds.  continue with magnesium supplements.  Replete k.   3-HTN; on lasix, propanolol.   4-Elevation of ammonia; will start lactulose, to see if this will help with MS.  5-Leukocytosis; will reat labs,\  Hartley BarefootBelkys Emelly Wurtz, Md.

## 2016-04-11 NOTE — Progress Notes (Signed)
Patient has been hyper-verbal, intrusive and in need of much redirection. 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  Patient was noted to be drowsy but refused to rest.  Patient is labile and irritable.   Assess patient for safety, offer medications as prescribed, engage patient in 1:1 staff talks.  Continue to monitor as planned. Patient able to contract for safety.  

## 2016-04-11 NOTE — Progress Notes (Signed)
[facility] MD Progress Note  04/11/2016 5:23 PM Catherine Bender  MRN:  [id number]   Subjective: Orphia reports, "I'm ready to go home".  Objective: Patient seen and chart reviewed. Discussed patient with treatment team. Patient is seen today within the hall way. She remained talkative & delusional. She talks non-stop threatening to sue people. She presents asloud, yelling, cursing at times. PRN medications were offered. Pt continues to have on and off periods when she is labile on the unit. Pt is on three mood stabilizers along with zyprexa. Pt is sleeping better. Pt also has medical issues which is being managed by hospitalist.Will continue to observe on the unit. She continues to use walker to aid her mobility.  Principal Problem: Schizoaffective disorder, bipolar type ([place]) Diagnosis:   Patient Active Problem List   Diagnosis Date Noted  . Agitation [R45.1]   . Change in behavior [R46.89]   . Pedal edema [R60.0] 04/06/2016  . Electrolyte imbalance [E87.8] 03/27/2016  . Prolonged Q-T interval on ECG [R94.31] 03/13/2016  . Schizoaffective disorder, bipolar type ([place]) [F25.0] 03/12/2016  . Diabetes mellitus ([place]) [E11.9] 03/12/2016   Total Time spent with patient: 25 minutes  Past Psychiatric History: Please see H&P.   Past Medical History:  Past Medical History:  Diagnosis Date  . Diabetes mellitus without complication [facility])     Past Surgical History:  Procedure Laterality Date  . umbical hernia repair     Family History: Please see H&P.  Family Psychiatric  History: Please see H&P.  Social History: Please see H&P.  History  Alcohol Use No     History  Drug Use No    Social History   Social History  . Marital status: Single    Spouse name: N/A  . Number of children: N/A  . Years of education: N/A   Social History Main Topics  . Smoking status: Current Every Day Smoker    Packs/day: 0.25  . Smokeless tobacco: Never Used  . Alcohol use No  . Drug use: No  . Sexual  activity: Not Asked   Other Topics Concern  . None   Social History Narrative  . None   Additional Social History:   Sleep: Fair (Improving)  Appetite:  Fair, (Improving)  Current Medications: Current Facility-Administered Medications  Medication Dose Route Frequency Provider Last Rate Last Dose  . acetaminophen (TYLENOL) tablet 650 mg  650 mg Oral Q6H PRN Laverle Hobby, PA-C   650 mg at 04/10/16 0810  . alum & mag hydroxide-simeth (MAALOX/MYLANTA) 200-200-20 MG/5ML suspension 30 mL  30 mL Oral Q4H PRN Laverle Hobby, PA-C   30 mL at 03/19/16 0021  . benztropine (COGENTIN) tablet 1 mg  1 mg Oral QHS Ursula Alert, MD   1 mg at 04/10/16 2313  . diphenhydrAMINE (BENADRYL) injection 50 mg  50 mg Intramuscular Once Ethelene Hal, NP      . divalproex (DEPAKOTE) DR tablet 1,000 mg  1,000 mg Oral QHS Ursula Alert, MD   1,000 mg at 04/10/16 2313  . divalproex (DEPAKOTE) DR tablet 500 mg  500 mg Oral QPC breakfast Ursula Alert, MD   500 mg at 04/11/16 0836  . furosemide (LASIX) tablet 40 mg  40 mg Oral Daily Reyne Dumas, MD   40 mg at 04/11/16 0836  . gabapentin (NEURONTIN) capsule 600 mg  600 mg Oral QHS Saramma Eappen, MD   600 mg at 04/10/16 2313  . haloperidol (HALDOL) tablet 10 mg  10 mg Oral Once Billey Chang  Romilda Garret, NP       Or  . haloperidol lactate (HALDOL) injection 10 mg  10 mg Intramuscular Once Ethelene Hal, NP      . insulin aspart (novoLOG) injection 0-15 Units  0-15 Units Subcutaneous TID WC Ursula Alert, MD   2 Units at 04/10/16 1717  . insulin aspart (novoLOG) injection 0-5 Units  0-5 Units Subcutaneous QHS Saramma Eappen, MD      . lactulose (CHRONULAC) 10 GM/15ML solution 30 g  30 g Oral TID Belkys A Regalado, MD      . lithium carbonate capsule 300 mg  300 mg Oral BID WC Saramma Eappen, MD   300 mg at 04/11/16 1717  . LORazepam (ATIVAN) tablet 1 mg  1 mg Oral Q6H PRN Ursula Alert, MD   1 mg at 04/10/16 9381   Or  . LORazepam (ATIVAN)  injection 1 mg  1 mg Intramuscular Q6H PRN Ursula Alert, MD      . magnesium hydroxide (MILK OF MAGNESIA) suspension 30 mL  30 mL Oral Daily PRN Laverle Hobby, PA-C      . magnesium oxide (MAG-OX) tablet 400 mg  400 mg Oral BID Reyne Dumas, MD   400 mg at 04/11/16 1717  . nicotine polacrilex (NICORETTE) gum 2 mg  2 mg Oral PRN Ursula Alert, MD      . OLANZapine zydis (ZYPREXA) disintegrating tablet 20 mg  20 mg Oral QHS Saramma Eappen, MD   20 mg at 04/10/16 2313  . OLANZapine zydis (ZYPREXA) disintegrating tablet 5 mg  5 mg Oral Daily Ursula Alert, MD   5 mg at 04/11/16 0837  . OXcarbazepine (TRILEPTAL) tablet 150 mg  150 mg Oral BID Ursula Alert, MD   150 mg at 04/11/16 1717  . potassium chloride SA (K-DUR,KLOR-CON) CR tablet 40 mEq  40 mEq Oral Once Belkys A Regalado, MD      . potassium chloride SA (K-DUR,KLOR-CON) CR tablet 40 mEq  40 mEq Oral Daily Belkys A Regalado, MD      . propranolol (INDERAL) tablet 10 mg  10 mg Oral TID Ursula Alert, MD   10 mg at 04/11/16 1717  . temazepam (RESTORIL) capsule 30 mg  30 mg Oral QHS Ursula Alert, MD   30 mg at 04/10/16 2312  . traZODone (DESYREL) tablet 25 mg  25 mg Oral QHS Ursula Alert, MD   25 mg at 04/10/16 2313   Lab Results:  Results for orders placed or performed during the hospital encounter of 03/11/16 (from the past 48 hour(s))  Glucose, capillary     Status: Abnormal   Collection Time: 04/09/16  8:31 PM  Result Value Ref Range   Glucose-Capillary 105 (H) 65 - 99 mg/dL  Glucose, capillary     Status: Abnormal   Collection Time: 04/10/16  6:47 AM  Result Value Ref Range   Glucose-Capillary 64 (L) 65 - 99 mg/dL  Glucose, capillary     Status: Abnormal   Collection Time: 04/10/16  6:58 AM  Result Value Ref Range   Glucose-Capillary 59 (L) 65 - 99 mg/dL  Glucose, capillary     Status: Abnormal   Collection Time: 04/10/16  7:08 AM  Result Value Ref Range   Glucose-Capillary 108 (H) 65 - 99 mg/dL  Glucose, capillary      Status: None   Collection Time: 04/10/16 12:16 PM  Result Value Ref Range   Glucose-Capillary 79 65 - 99 mg/dL  Glucose, capillary     Status: Abnormal  Collection Time: 04/10/16  4:56 PM  Result Value Ref Range   Glucose-Capillary 143 (H) 65 - 99 mg/dL  CBC     Status: Abnormal   Collection Time: 04/10/16  6:47 PM  Result Value Ref Range   WBC 14.0 (H) 4.0 - 10.5 K/uL   RBC 3.74 (L) 3.87 - 5.11 MIL/uL   Hemoglobin 11.1 (L) 12.0 - 15.0 g/dL   HCT 33.9 (L) 36.0 - 46.0 %   MCV 90.6 78.0 - 100.0 fL   MCH 29.7 26.0 - 34.0 pg   MCHC 32.7 30.0 - 36.0 g/dL   RDW 15.7 (H) 11.5 - 15.5 %   Platelets 313 150 - 400 K/uL    Comment: Performed at [facility], [place] [address] [place], [place] [address]  Comprehensive metabolic panel     Status: Abnormal   Collection Time: 04/10/16  6:47 PM  Result Value Ref Range   Sodium 136 135 - 145 mmol/L   Potassium 3.8 3.5 - 5.1 mmol/L   Chloride 97 (L) 101 - 111 mmol/L   CO2 31 22 - 32 mmol/L   Glucose, Bld 114 (H) 65 - 99 mg/dL   BUN 21 (H) 6 - 20 mg/dL   Creatinine, Ser 0.96 0.44 - 1.00 mg/dL   Calcium 8.7 (L) 8.9 - 10.3 mg/dL   Total Protein 7.0 6.5 - 8.1 g/dL   Albumin 3.3 (L) 3.5 - 5.0 g/dL   AST 19 15 - 41 U/L   ALT 13 (L) 14 - 54 U/L   Alkaline Phosphatase 56 38 - 126 U/L   Total Bilirubin 0.3 0.3 - 1.2 mg/dL   GFR calc non Af Amer >60 >60 mL/min   GFR calc Af Amer >60 >60 mL/min    Comment: (NOTE) The eGFR has been calculated using the CKD EPI equation. This calculation has not been validated in all clinical situations. eGFR's persistently <60 mL/min signify possible Chronic Kidney Disease.    Anion gap 8 5 - 15    Comment: Performed at [facility], [place] [address] [place], [place] [address]  Lithium level     Status: Abnormal   Collection Time: 04/10/16  6:47 PM  Result Value Ref Range   Lithium Lvl 0.46 (L) 0.60 - 1.20 mmol/L    Comment: Performed at [facility], [place]  [address] [place], [place] [address]  Ammonia     Status: Abnormal   Collection Time: 04/10/16  6:47 PM  Result Value Ref Range   Ammonia 82 (H) 9 - 35 umol/L    Comment: Performed at [facility], [place] [address], [place], [place] [address]  Glucose, capillary     Status: Abnormal   Collection Time: 04/10/16  8:31 PM  Result Value Ref Range   Glucose-Capillary 148 (H) 65 - 99 mg/dL  Glucose, capillary     Status: None   Collection Time: 04/11/16  6:18 AM  Result Value Ref Range   Glucose-Capillary 77 65 - 99 mg/dL  Glucose, capillary     Status: None   Collection Time: 04/11/16 11:45 AM  Result Value Ref Range   Glucose-Capillary 99 65 - 99 mg/dL  Glucose, capillary     Status: Abnormal   Collection Time: 04/11/16  5:10 PM  Result Value Ref Range   Glucose-Capillary 123 (H) 65 - 99 mg/dL    Blood Alcohol level:  Lab Results  Component Value Date   ETH <5 03/11/2016   ETH <5  29/56/2130    Metabolic Disorder Labs: Lab Results  Component Value Date   HGBA1C 5.2 03/13/2016   MPG 103 03/13/2016   Lab Results  Component Value Date   PROLACTIN 27.9 (H) 03/13/2016   Lab Results  Component Value Date   CHOL 150 03/13/2016   TRIG 69 03/13/2016   HDL 43 03/13/2016   CHOLHDL 3.5 03/13/2016   VLDL 14 03/13/2016   LDLCALC 93 03/13/2016    Physical Findings: AIMS: Facial and Oral Movements Muscles of Facial Expression: None, normal Lips and Perioral Area: None, normal Jaw: None, normal Tongue: None, normal,Extremity Movements Upper (arms, wrists, hands, fingers): None, normal Lower (legs, knees, ankles, toes): None, normal, Trunk Movements Neck, shoulders, hips: None, normal, Overall Severity Severity of abnormal movements (highest score from questions above): None, normal Incapacitation due to abnormal movements: None, normal Patient's awareness of abnormal movements (rate only patient's report): No Awareness, Dental Status Current problems with  teeth and/or dentures?: No Does patient usually wear dentures?: No  CIWA:  CIWA-Ar Total: 1 COWS:  COWS Total Score: 2  Musculoskeletal: Strength & Muscle Tone: within normal limits Gait & Station: normal Patient leans: N/A  Psychiatric Specialty Exam: Physical Exam  Nursing note and vitals reviewed.   Review of Systems  Psychiatric/Behavioral: The patient is nervous/anxious.   All other systems reviewed and are negative.   Blood pressure (!) 151/84, pulse 76, temperature 98.5 F (36.9 C), temperature source Oral, resp. rate 20, height 5' 5"  (1.651 m), weight 92.5 kg (204 lb), SpO2 99 %.Body mass index is 33.95 kg/m.  General Appearance: Fairly Groomed  Eye Contact:  Fair  Speech:  Pressured, loud  Volume:  loud on and off   Mood:  labile at times  Affect:  congruent  Thought Process:  Linear and Descriptions of Associations: Circumstantial   Orientation:  Full (Time, Place, and Person)  Thought Content:  Delusions and Rumination random delusional statements like Roe Rutherford is my cousin.ruminates about how her family is stealing her money  Suicidal Thoughts:  No  Homicidal Thoughts:  No  Memory:  Immediate;   Fair Recent;   Fair Remote;   Poor  Judgement:  Impaired  Insight:  Shallow  Psychomotor Activity:  Restlessness  Concentration:  Concentration: Fair and Attention Span: Fair  Recall:  [employer] of Knowledge:  Fair  Language:  Fair  Akathisia:  No  Handed:  Right  AIMS (if indicated):     Assets:  Desire for Improvement Talents/Skills  ADL's:  Intact  Cognition:  WNL  Sleep:  Number of Hours: 4.75   Schizoaffective disorder, bipolar type ([place]) unstable - however, improving  Will continue today 04/11/16  plan as below except where it is noted.  Treatment plan: Patient today remains with mood lability. She remains loud, grandiose & delusional. Pt continues to ruminate about her family stealing money and how her grandson is being emotionally abused. Pt  does not do well when she makes calls to her family, especially her mother - during and right after calls to family - she is agitated and upset. Pt is alert, not confused , is sleeping better.Pt does well with certain staff, is seen as having good times often on the unit .  Pt is also being followed by hospitalist for her medical issues. Labs ordered by hospitalist yesterday were not collected , will reorder and signout to weekend team.  Daily contact with patient to assess and evaluate symptoms and progress in treatment and Medication management  No changes made today - will await Li level of 04-11-16: current Lithium level 0.46 sub-therapeutic level.  For schizoaffective do: Will continue Zyprexa 25 mg po daily in divided doses.  For EPS: Will continue Cogentin 1 mg po qhs.  For Mood Lability: Will continue Depakote DR 500 mg po daily and 1000 mg po qhs.  For agitation/Restlessness: Will continue Neurontin 600 mg po qhs.  For mood stabilization: Will continue Trileptal at 150 mg po bid. Will continue Lithium 300 mg po bid.  Lithium level on 04/11/16 0.46, sub-therapeutic range.   For insomnia: Will continue Restoril 30 mg po qhs. Will continue Trazodone 25 mg po qhs (will be cautious due to h/o qtc prolongation). Patient failed Ambien, Lunesta , doxepin , elavil  Trials)   For restlessness/akathisia:reviewed as below Will continue Propranolol to 10 mg po tid.  For pedal edema: Reviewed recent lab reports of 04-10-16.  H/O prolonged QTC on EKG and she is also on Lithium: Repeat EKG - shows qtc - 456, nonspecific T wave abnormality/NSR.  Given PPD test  03/19/16 - for ALF placement- - negative .  DM: Will continue current medication regimen.  Home health consult placed- pending.  CSW will work on referral to day program / TCT.  Pt referred to [facility] . Pt also could be discharged back to her boarding home - if she improves. Family meeting was conducted on  04/07/16.  Patient to participate in therapeutic milieu. 04-11-16: No changes made on the current plan of care.  Encarnacion Slates, NP, PMHNP, FNP-BC 04/11/2016, 5:23 PM Patient ID: Catherine Bender, female   DOB: [birth year], 46 y.o.   MRN: [id number]

## 2016-04-11 NOTE — Progress Notes (Signed)
Adult Psychoeducational Group Note  Date:  04/11/2016 Time:  9:49 PM  Group Topic/Focus:  Wrap-Up Group:   The focus of this group is to help patients review their daily goal of treatment and discuss progress on daily workbooks.  Participation Level:  Active  Participation Quality:  Appropriate  Affect:  Appropriate  Cognitive:  Appropriate  Insight: Appropriate  Engagement in Group:  Engaged  Modes of Intervention:  Discussion  Additional Comments: The patient expressed that she had a great day.    Octavio Mannshigpen, Shweta Aman Lee 04/11/2016, 9:49 PM

## 2016-04-12 LAB — GLUCOSE, CAPILLARY
GLUCOSE-CAPILLARY: 75 mg/dL (ref 65–99)
GLUCOSE-CAPILLARY: 120 mg/dL — AB (ref 65–99)
GLUCOSE-CAPILLARY: 119 mg/dL — AB (ref 65–99)
Glucose-Capillary: 103 mg/dL — ABNORMAL HIGH (ref 65–99)

## 2016-04-12 LAB — CBC
HCT: 35.4 % — ABNORMAL LOW (ref 36.0–46.0)
HEMOGLOBIN: 11.4 g/dL — AB (ref 12.0–15.0)
MCH: 29.7 pg (ref 26.0–34.0)
MCHC: 32.2 g/dL (ref 30.0–36.0)
MCV: 92.2 fL (ref 78.0–100.0)
PLATELETS: 300 10*3/uL (ref 150–400)
RBC: 3.84 MIL/uL — ABNORMAL LOW (ref 3.87–5.11)
RDW: 15.8 % — ABNORMAL HIGH (ref 11.5–15.5)
WBC: 12.7 10*3/uL — AB (ref 4.0–10.5)

## 2016-04-12 MED ORDER — LACTULOSE 10 GM/15ML PO SOLN
30.0000 g | Freq: Four times a day (QID) | ORAL | Status: DC
  Administered 2016-04-12 – 2016-04-15 (×8): 30 g via ORAL
  Filled 2016-04-12 (×15): qty 45

## 2016-04-12 NOTE — Progress Notes (Signed)
D: Pt continues to be loud, intrusive and delusional. Pt denies pain, depression, anxiety, SI, HI or AVH; state; "you know that I work here; I will let you ask me these questions today but you can ask me tomorrow."  Pt remained nonviolent.  A: Medications offered as prescribed. All patient's questions and concerns were addressed. Support, encouragement, and safe environment provided. 15-minute safety checks continue.  R: Pt was med compliant.  Pt attended wrap-up group. Safety checks continue

## 2016-04-12 NOTE — Progress Notes (Signed)
Pt stated she is been having BM several time, could not hold back at sometime and had an accident on her pant.

## 2016-04-12 NOTE — Progress Notes (Signed)
Internal Medicine follow up note.  Patient seen and examined.  Patient with significant lower extremity edema.   1- Lower extremity edema;  Started on lasix 3-01.  Continue with current dose.  Kcl daily   2-prolong QT; defer to psych team adjustment of psych meds.  continue with magnesium supplements.  EKG intermittently   3-HTN; on lasix, propanolol.   4-Elevation of ammonia; increase Lactulose. No BM yet.  Repeat ammonia level after patient have multiple BM.   5-Leukocytosis; stable.   Will sign off, Please call us as needed.   Eaden Hettinger, Md.

## 2016-04-12 NOTE — Progress Notes (Signed)
DAR NOTE: Pt present with flat affect and depressed mood in the unit. Pt has been intrusive, loud, issuing verbal threats and constantly requiring redirections. Pt denies physical pain, took all her meds as scheduled. As per self inventory, pt had a good night sleep, good appetite, normal energy, and good concentration. Pt rate depression at 0, hopeless ness at 0, and anxiety at 0. Pt's safety ensured with 15 minute and environmental checks. Pt currently denies SI/HI and A/V hallucinations. Pt verbally agrees to seek staff if SI/HI or A/VH occurs and to consult with staff before acting on these thoughts. Will continue POC.

## 2016-04-12 NOTE — Progress Notes (Signed)
Adult Psychoeducational Group Note  Date:  04/12/2016 Time:  9:11 PM  Group Topic/Focus:  Wrap-Up Group:   The focus of this group is to help patients review their daily goal of treatment and discuss progress on daily workbooks.  Participation Level:  Active  Participation Quality:  Appropriate  Affect:  Appropriate  Cognitive:  Alert  Insight: Appropriate  Engagement in Group:  Engaged  Modes of Intervention:  Discussion  Additional Comments:  Patient stated she had a terrible day because she used the bathroom on herself. Patient's goal for today was to go home.  Norell Brisbin L Savon Bordonaro 04/12/2016, 9:11 PM

## 2016-04-12 NOTE — Progress Notes (Signed)
[facility] MD Progress Note  04/12/2016 1:53 PM Winola Drum  MRN:  [id number]   Subjective: Kristopher reports, "I'm ready to go home, live on my own. I don't like how my boyfriend looks at me when I'm tired saying, your titties look look good, yea, I'm talking about my boyfriend".  Objective: Patient seen & chart reviewed. Discussed patient with treatment team. Patient is seen today within the hall way. She remained talkative & delusional. She talks non-stop. She continues to threaten to sue people. She presents as loud, yelling, cursing at times. PRN medications were offered. Pt continues to have on and off periods when she is labile on the unit. Pt is on three mood stabilizers along with zyprexa. Pt is sleeping better. Pt also has medical issues which is being managed by hospitalist.Will continue to observe on the unit. She continues to use walker to aid her mobility. However, she was seen today dancing in the day room without any support. Other patients were clapping & cheering her.  Principal Problem: Schizoaffective disorder, bipolar type ([place]) Diagnosis:   Patient Active Problem List   Diagnosis Date Noted  . Agitation [R45.1]   . Change in behavior [R46.89]   . Pedal edema [R60.0] 04/06/2016  . Electrolyte imbalance [E87.8] 03/27/2016  . Prolonged Q-T interval on ECG [R94.31] 03/13/2016  . Schizoaffective disorder, bipolar type ([place]) [F25.0] 03/12/2016  . Diabetes mellitus ([place]) [E11.9] 03/12/2016   Total Time spent with patient: 15 minutes  Past Psychiatric History: Please see H&P.   Past Medical History:  Past Medical History:  Diagnosis Date  . Diabetes mellitus without complication [facility])     Past Surgical History:  Procedure Laterality Date  . umbical hernia repair     Family History: Please see H&P.  Family Psychiatric  History: Please see H&P.  Social History: Please see H&P.  History  Alcohol Use No     History  Drug Use No    Social History   Social History  .  Marital status: Single    Spouse name: N/A  . Number of children: N/A  . Years of education: N/A   Social History Main Topics  . Smoking status: Current Every Day Smoker    Packs/day: 0.25  . Smokeless tobacco: Never Used  . Alcohol use No  . Drug use: No  . Sexual activity: Not Asked   Other Topics Concern  . None   Social History Narrative  . None   Additional Social History:   Sleep: Fair (Improving)  Appetite:  Fair, (Improving)  Current Medications: Current Facility-Administered Medications  Medication Dose Route Frequency Provider Last Rate Last Dose  . acetaminophen (TYLENOL) tablet 650 mg  650 mg Oral Q6H PRN Laverle Hobby, PA-C   650 mg at 04/10/16 0810  . alum & mag hydroxide-simeth (MAALOX/MYLANTA) 200-200-20 MG/5ML suspension 30 mL  30 mL Oral Q4H PRN Laverle Hobby, PA-C   30 mL at 04/11/16 2047  . benztropine (COGENTIN) tablet 1 mg  1 mg Oral QHS Ursula Alert, MD   1 mg at 04/11/16 2110  . diphenhydrAMINE (BENADRYL) injection 50 mg  50 mg Intramuscular Once Ethelene Hal, NP      . divalproex (DEPAKOTE) DR tablet 1,000 mg  1,000 mg Oral QHS Ursula Alert, MD   1,000 mg at 04/11/16 2111  . divalproex (DEPAKOTE) DR tablet 500 mg  500 mg Oral QPC breakfast Ursula Alert, MD   500 mg at 04/12/16 0810  . furosemide (LASIX) tablet 40  mg  40 mg Oral Daily Reyne Dumas, MD   40 mg at 04/12/16 0809  . gabapentin (NEURONTIN) capsule 600 mg  600 mg Oral QHS Saramma Eappen, MD   600 mg at 04/11/16 2111  . haloperidol (HALDOL) tablet 10 mg  10 mg Oral Once Ethelene Hal, NP       Or  . haloperidol lactate (HALDOL) injection 10 mg  10 mg Intramuscular Once Ethelene Hal, NP      . insulin aspart (novoLOG) injection 0-15 Units  0-15 Units Subcutaneous TID WC Ursula Alert, MD   2 Units at 04/10/16 1717  . insulin aspart (novoLOG) injection 0-5 Units  0-5 Units Subcutaneous QHS Saramma Eappen, MD      . lactulose (CHRONULAC) 10 GM/15ML solution 30 g   30 g Oral TID Belkys A Regalado, MD   30 g at 04/12/16 1208  . lithium carbonate capsule 300 mg  300 mg Oral BID WC Ursula Alert, MD   300 mg at 04/12/16 0808  . LORazepam (ATIVAN) tablet 1 mg  1 mg Oral Q6H PRN Ursula Alert, MD   1 mg at 04/10/16 3532   Or  . LORazepam (ATIVAN) injection 1 mg  1 mg Intramuscular Q6H PRN Ursula Alert, MD      . magnesium hydroxide (MILK OF MAGNESIA) suspension 30 mL  30 mL Oral Daily PRN Laverle Hobby, PA-C      . magnesium oxide (MAG-OX) tablet 400 mg  400 mg Oral BID Reyne Dumas, MD   400 mg at 04/12/16 0809  . nicotine polacrilex (NICORETTE) gum 2 mg  2 mg Oral PRN Ursula Alert, MD      . OLANZapine zydis (ZYPREXA) disintegrating tablet 20 mg  20 mg Oral QHS Ursula Alert, MD   20 mg at 04/11/16 2110  . OLANZapine zydis (ZYPREXA) disintegrating tablet 5 mg  5 mg Oral Daily Ursula Alert, MD   5 mg at 04/12/16 0809  . OXcarbazepine (TRILEPTAL) tablet 150 mg  150 mg Oral BID Ursula Alert, MD   150 mg at 04/12/16 0809  . potassium chloride SA (K-DUR,KLOR-CON) CR tablet 40 mEq  40 mEq Oral Daily Belkys A Regalado, MD      . propranolol (INDERAL) tablet 10 mg  10 mg Oral TID Ursula Alert, MD   10 mg at 04/12/16 1208  . temazepam (RESTORIL) capsule 30 mg  30 mg Oral QHS Ursula Alert, MD   30 mg at 04/11/16 2111  . traZODone (DESYREL) tablet 25 mg  25 mg Oral QHS Ursula Alert, MD   25 mg at 04/11/16 2111   Lab Results:  Results for orders placed or performed during the hospital encounter of 03/11/16 (from the past 48 hour(s))  Glucose, capillary     Status: Abnormal   Collection Time: 04/10/16  4:56 PM  Result Value Ref Range   Glucose-Capillary 143 (H) 65 - 99 mg/dL  CBC     Status: Abnormal   Collection Time: 04/10/16  6:47 PM  Result Value Ref Range   WBC 14.0 (H) 4.0 - 10.5 K/uL   RBC 3.74 (L) 3.87 - 5.11 MIL/uL   Hemoglobin 11.1 (L) 12.0 - 15.0 g/dL   HCT 33.9 (L) 36.0 - 46.0 %   MCV 90.6 78.0 - 100.0 fL   MCH 29.7 26.0 - 34.0 pg    MCHC 32.7 30.0 - 36.0 g/dL   RDW 15.7 (H) 11.5 - 15.5 %   Platelets 313 150 - 400 K/uL  Comment: Performed at [facility], [place] [address], [place], [place] [address]  Comprehensive metabolic panel     Status: Abnormal   Collection Time: 04/10/16  6:47 PM  Result Value Ref Range   Sodium 136 135 - 145 mmol/L   Potassium 3.8 3.5 - 5.1 mmol/L   Chloride 97 (L) 101 - 111 mmol/L   CO2 31 22 - 32 mmol/L   Glucose, Bld 114 (H) 65 - 99 mg/dL   BUN 21 (H) 6 - 20 mg/dL   Creatinine, Ser 0.96 0.44 - 1.00 mg/dL   Calcium 8.7 (L) 8.9 - 10.3 mg/dL   Total Protein 7.0 6.5 - 8.1 g/dL   Albumin 3.3 (L) 3.5 - 5.0 g/dL   AST 19 15 - 41 U/L   ALT 13 (L) 14 - 54 U/L   Alkaline Phosphatase 56 38 - 126 U/L   Total Bilirubin 0.3 0.3 - 1.2 mg/dL   GFR calc non Af Amer >60 >60 mL/min   GFR calc Af Amer >60 >60 mL/min    Comment: (NOTE) The eGFR has been calculated using the CKD EPI equation. This calculation has not been validated in all clinical situations. eGFR's persistently <60 mL/min signify possible Chronic Kidney Disease.    Anion gap 8 5 - 15    Comment: Performed at [facility], [place] [address] [place], [place] [address]  Lithium level     Status: Abnormal   Collection Time: 04/10/16  6:47 PM  Result Value Ref Range   Lithium Lvl 0.46 (L) 0.60 - 1.20 mmol/L    Comment: Performed at [facility], [place] [address] [place], [place] [address]  Ammonia     Status: Abnormal   Collection Time: 04/10/16  6:47 PM  Result Value Ref Range   Ammonia 82 (H) 9 - 35 umol/L    Comment: Performed at [facility], [place] [address] [place],  [address]  Glucose, capillary     Status: Abnormal   Collection Time: 04/10/16  8:31 PM  Result Value Ref Range   Glucose-Capillary 148 (H) 65 - 99 mg/dL  Glucose, capillary     Status: None   Collection Time: 04/11/16  6:18 AM  Result Value Ref Range   Glucose-Capillary 77 65 -  99 mg/dL  Glucose, capillary     Status: None   Collection Time: 04/11/16 11:45 AM  Result Value Ref Range   Glucose-Capillary 99 65 - 99 mg/dL  Glucose, capillary     Status: Abnormal   Collection Time: 04/11/16  5:10 PM  Result Value Ref Range   Glucose-Capillary 123 (H) 65 - 99 mg/dL  Glucose, capillary     Status: Abnormal   Collection Time: 04/11/16  8:29 PM  Result Value Ref Range   Glucose-Capillary 144 (H) 65 - 99 mg/dL   Comment 1 Notify RN    Comment 2 Document in Chart   CBC     Status: Abnormal   Collection Time: 04/12/16  6:12 AM  Result Value Ref Range   WBC 12.7 (H) 4.0 - 10.5 K/uL   RBC 3.84 (L) 3.87 - 5.11 MIL/uL   Hemoglobin 11.4 (L) 12.0 - 15.0 g/dL   HCT 35.4 (L) 36.0 - 46.0 %   MCV 92.2 78.0 - 100.0 fL   MCH 29.7 26.0 - 34.0 pg   MCHC 32.2 30.0 - 36.0 g/dL   RDW 15.8 (H) 11.5 - 15.5 %   Platelets 300 150 - 400  K/uL    Comment: Performed at [facility], [place] [address] [place], [place] [address]  Glucose, capillary     Status: None   Collection Time: 04/12/16  6:14 AM  Result Value Ref Range   Glucose-Capillary 75 65 - 99 mg/dL  Glucose, capillary     Status: Abnormal   Collection Time: 04/12/16 11:53 AM  Result Value Ref Range   Glucose-Capillary 103 (H) 65 - 99 mg/dL    Blood Alcohol level:  Lab Results  Component Value Date   ETH <5 03/11/2016   ETH <5 70/35/0093   Metabolic Disorder Labs: Lab Results  Component Value Date   HGBA1C 5.2 03/13/2016   MPG 103 03/13/2016   Lab Results  Component Value Date   PROLACTIN 27.9 (H) 03/13/2016   Lab Results  Component Value Date   CHOL 150 03/13/2016   TRIG 69 03/13/2016   HDL 43 03/13/2016   CHOLHDL 3.5 03/13/2016   VLDL 14 03/13/2016   LDLCALC 93 03/13/2016   Physical Findings: AIMS: Facial and Oral Movements Muscles of Facial Expression: None, normal Lips and Perioral Area: None, normal Jaw: None, normal Tongue: None, normal,Extremity Movements Upper (arms,  wrists, hands, fingers): None, normal Lower (legs, knees, ankles, toes): None, normal, Trunk Movements Neck, shoulders, hips: None, normal, Overall Severity Severity of abnormal movements (highest score from questions above): None, normal Incapacitation due to abnormal movements: None, normal Patient's awareness of abnormal movements (rate only patient's report): No Awareness, Dental Status Current problems with teeth and/or dentures?: No Does patient usually wear dentures?: No  CIWA:  CIWA-Ar Total: 1 COWS:  COWS Total Score: 2  Musculoskeletal: Strength & Muscle Tone: within normal limits Gait & Station: normal Patient leans: N/A  Psychiatric Specialty Exam: Physical Exam  Nursing note and vitals reviewed.   Review of Systems  Psychiatric/Behavioral: The patient is nervous/anxious.   All other systems reviewed and are negative.   Blood pressure 132/84, pulse 82, temperature 98.6 F (37 C), resp. rate 20, height 5' 5"  (1.651 m), weight 92.5 kg (204 lb), SpO2 99 %.Body mass index is 33.95 kg/m.  General Appearance: Fairly Groomed  Eye Contact:  Fair  Speech:  Pressured, loud  Volume:  loud on and off   Mood:  labile at times  Affect:  congruent  Thought Process:  Linear and Descriptions of Associations: Circumstantial   Orientation:  Full (Time, Place, and Person)  Thought Content:  Delusions and Rumination random delusional statements like Roe Rutherford is my cousin.ruminates about how her family is stealing her money  Suicidal Thoughts:  No  Homicidal Thoughts:  No  Memory:  Immediate;   Fair Recent;   Fair Remote;   Poor  Judgement:  Impaired  Insight:  Shallow  Psychomotor Activity:  Restlessness  Concentration:  Concentration: Fair and Attention Span: Fair  Recall:  [employer] of Knowledge:  Fair  Language:  Fair  Akathisia:  No  Handed:  Right  AIMS (if indicated):     Assets:  Desire for Improvement Talents/Skills  ADL's:  Intact  Cognition:  WNL   Sleep:  Number of Hours: 6.25   Schizoaffective disorder, bipolar type ([place]) Unstable - however, improving  Will continue today 04/12/16  plan as below except where it is noted.  Treatment plan: Patient today remains with mood lability. She remains loud, grandiose & delusional. Pt continues to ruminate about her family stealing money and how her grandson is being emotionally abused. Pt does not do well  when she makes calls to her family, especially her mother - during and right after calls to family - she is agitated and upset. Pt is alert, not confused , is sleeping better.Pt does well with certain staff, is seen as having good times often on the unit .  Pt is also being followed by hospitalist for her medical issues. Labs ordered by hospitalist yesterday were not collected , will reorder and signout to weekend team.  Daily contact with patient to assess and evaluate symptoms and progress in treatment and Medication management   For schizoaffective do: Will continue Zyprexa 20 mg po bedtime. Will continue Zyprexa 5 mg daily.  For EPS: Will continue Cogentin 1 mg po qhs.  For Mood Lability: Will continue Depakote DR 500 mg po daily & 1000 mg po qhs.  For agitation/Restlessness: Will continue Neurontin 600 mg po qhs.  For mood stabilization: Will continue Trileptal at 150 mg po bid. Will continue Lithium 300 mg po bid. Lithium level on 04/11/16 0.46, sub-therapeutic range.   For insomnia: Will continue Restoril 30 mg po qhs. Will continue Trazodone 25 mg po qhs (will be cautious due to h/o qtc prolongation). Patient failed Ambien, Lunesta , doxepin , elavil  Trials)   For restlessness/akathisia:reviewed as below Will continue Propranolol to 10 mg po tid.  For pedal edema: Reviewed recent lab reports of 04-10-16.  H/O prolonged QTC on EKG and she is also on Lithium: Repeat EKG - shows qtc - 456, nonspecific T wave abnormality/NSR.  Given PPD test 03/19/16 - for ALF  placement- - negative .  DM: Will continue current medication regimen.  Home health consult placed- pending.  CSW will work on referral to day program / TCT.  [facility] to [facility] . Pt also could be discharged back to her boarding home - if she improves. Family meeting was conducted on 04/07/16.  Patient to participate in therapeutic milieu. 04-12-16: No changes made on the current plan of care.  Encarnacion Slates, NP, PMHNP, FNP-BC 04/12/2016, 1:53 PM Patient ID: Rockney Ghee, female   DOB: 1971-02-02, 46 y.o.   MRN: [id number]

## 2016-04-12 NOTE — [facility] Group Notes (Signed)
[facility] Group Notes:  (Clinical Social Work)  04/12/2016  11:00AM-12:00PM  Summary of Progress/Problems:  The main focus of today's process group was to listen to a variety of genres of music and to identify that different types of music provoke different responses.  The patient then was able to identify personally what was soothing for them, as well as energizing, as well as how patient can personally use this knowledge in sleep habits, with depression, and with other symptoms.  The patient was in and out of group numerous times, talking loudly and having to be redirected continuously.  She danced quite a bit, sang so loudly she had to be calmed down, complained about the nurses and was quite distracting.  At one point she insisted on eating what she said was yesterday's lunch, saying "Ya'll ain't gonna starve me to death."    Type of Therapy:  Music Therapy   Participation Level:  Active  Participation Quality:  Distracting  Affect:  Labile  Cognitive:  Disorganized and Delusional  Insight:  Poor  Engagement in Therapy:  Engaged  Modes of Intervention:   Activity, Exploration  Ambrose MantleMareida Grossman-Orr, LCSW 04/12/2016

## 2016-04-13 DIAGNOSIS — E7220 Disorder of urea cycle metabolism, unspecified: Secondary | ICD-10-CM | POA: Clinically undetermined

## 2016-04-13 LAB — GLUCOSE, CAPILLARY
GLUCOSE-CAPILLARY: 110 mg/dL — AB (ref 65–99)
Glucose-Capillary: 98 mg/dL (ref 65–99)
Glucose-Capillary: 87 mg/dL (ref 65–99)
Glucose-Capillary: 117 mg/dL — ABNORMAL HIGH (ref 65–99)

## 2016-04-13 MED ORDER — OXCARBAZEPINE 300 MG PO TABS
300.0000 mg | ORAL_TABLET | Freq: Two times a day (BID) | ORAL | Status: DC
  Administered 2016-04-13 – 2016-04-16 (×6): 300 mg via ORAL
  Filled 2016-04-13: qty 14
  Filled 2016-04-13 (×8): qty 1
  Filled 2016-04-13: qty 14
  Filled 2016-04-13: qty 1

## 2016-04-13 MED ORDER — GUAIFENESIN 100 MG/5ML PO SOLN: 5.0000 mL | ORAL | Status: DC | PRN

## 2016-04-13 NOTE — Progress Notes (Signed)
D: Patient denies SI/HI and A/V hallucinations; patient constantly reporting that she wants to be discharged and go home, pt. Also states " I want to be in control of my own money"  A: Monitored q 15 minutes; patient encouraged to attend groups; patient educated about medications; patient given medications per physician orders; patient encouraged to express feelings and/or concerns  R: Patient is labile; patient is cooperative at times and sometimes is very resistant; patient is loud and intrusive; patient is taking medications as prescribed and tolerating medications; patient is not attending any groups; patient is constantly talking and on the phone; patient has confusion and delusions

## 2016-04-13 NOTE — [facility] Group Notes (Signed)
[facility] Group Notes:  (Counselor/Nursing/MHT/Case Management/Adjunct)  04/13/2016 1:15PM  Type of Therapy:  Group Therapy  Participation Level:  Active  Participation Quality:  Appropriate  Affect:  Flat  Cognitive:  Oriented  Insight:  Improving  Engagement in Group:  Limited  Engagement in Therapy:  Limited  Modes of Intervention:  Discussion, Exploration and Socialization  Summary of Progress/Problems: The topic for group was balance in life.  Pt participated in the discussion about when their life was in balance and out of balance and how this feels.  Pt discussed ways to get back in balance and short term goals they can work on to get where they want to be.  In and out multiple times.  Apologetic, but the behavior continues.  [name], Brodie Correll B 04/13/2016 1:45 PM

## 2016-04-13 NOTE — Progress Notes (Signed)
Patient refused 1200 medications ans Dr. Elna BreslowEappen made aware

## 2016-04-13 NOTE — Progress Notes (Signed)
D: Pt continues to be loud, intrusive and delusional. Pt complained of moderate bilateral foot pain. Pt however denies depression, anxiety, SI, HI or AVH; state; "I doing just fine; I'm not seeing things or hearing voice." Pt was observed interacting with peers in the dayroom. Pt remained nonviolent.  A: Medications offered as prescribed. All patient's questions and concerns were addressed. Support, encouragement, and safe environment provided. 15-minute safety checks continue.   R: Pt was med compliant.  Pt attended wrap-up group. Safety checks continue

## 2016-04-13 NOTE — Progress Notes (Signed)
Recreation Therapy Notes  Date: 04/13/16 Time: 1000 Location: 500 Hall Dayroom  Group Topic: Coping Skills  Goal Area(s) Addresses:  Patient will be able to identify positive coping skills. Patient will be able to identify the benefits of positive coping skills. Patient will be able to identify benefits of using coping skills post d/c.  Behavioral Response: Minimal  Intervention: Pencils, blank web   Activity: OrthoptistWeb Design.  Patients were given an blank web worksheet.  Patients were to identify the things that have them "stuck" and write them inside of the web.  Patients were to then identify coping skills to help them deal with the situations they are faced with.   Education: PharmacologistCoping Skills, Building control surveyorDischarge Planning.   Education Outcome: Acknowledges understanding/In group clarification offered/Needs additional education.   Clinical Observations/Feedback: Pt stated she misses her grandchildren and people being jealous of her are things that have her stuck.  Pt identified her coping skill as compromising with family.  Pt had a hard time focusing and staying on task.  Pt left early and did not return.   [name], LRT/CTRS      Caroll RancherLindsay, [name] A 04/13/2016 12:14 PM

## 2016-04-13 NOTE — Progress Notes (Signed)
  D: Pt had a visitor today and informed that he was her fiance. Pt's visit appeared to be well, AEB pt kissing the visitor on his way out of the door as she was smiling. Pt continues to be intrusive and delusional at times. Pt has no questions or concerns.    A:  Support and encouragement was offered. 15 min checks continued for safety.  R: Pt remains safe.

## 2016-04-13 NOTE — Progress Notes (Signed)
[facility]  04/13/2016 1:01 PM Catherine LimberRenitha Bender  MRN:  [id number]   Subjective: Patient states " My son can take care of my money. I am ready to go."   Objective:Patient seen and chart reviewed.Discussed patient with treatment team.  Pt seen on the unit , is seen as pressured often, but redirectable. Pt continues to have random delusions on and off, but is making progress. Pt needs redirection off and on , on the unit . Pt had hyperammonemia , recent labs - she is on depakote , and hence will dc depakote , since it is likely depakote induced. Pt will need another ammonia level . CSW will continue to work on after care , support services . Continue to encourage and support.   Principal Problem: Schizoaffective disorder, bipolar type (HCC) Diagnosis:   Patient Active Problem List   Diagnosis Date Noted  . Hyperammonemia (HCC) [E72.20] 04/13/2016  . Agitation [R45.1]   . Change in behavior [R46.89]   . Pedal edema [R60.0] 04/06/2016  . Electrolyte imbalance [E87.8] 03/27/2016  . Prolonged Q-T interval on ECG [R94.31] 03/13/2016  . Schizoaffective disorder, bipolar type (HCC) [F25.0] 03/12/2016  . Diabetes mellitus (HCC) [E11.9] 03/12/2016   Total Time spent with patient: 30 minutes  Past Psychiatric History: Please see H&P.   Past Medical History:  Past Medical History:  Diagnosis Date  . Diabetes mellitus without complication [facility](HCC)     Past Surgical History:  Procedure Laterality Date  . umbical hernia repair     Family History: Please see H&P.  Family Psychiatric  History: Please see H&P.  Social History: Please see H&P.  History  Alcohol Use No     History  Drug Use No    Social History   Social History  . Marital status: Single    Spouse name: N/A  . Number of children: N/A  . Years of education: N/A   Social History Main Topics  . Smoking status: Current Every Day Smoker    Packs/day: 0.25  . Smokeless tobacco: Never Used  . Alcohol use No   . Drug use: No  . Sexual activity: Not Asked   Other Topics Concern  . None   Social History Narrative  . None   Additional Social History:   Sleep: Fair   Appetite:  Fair  Current Medications: Current Facility-Administered Medications  Medication Dose Route Frequency Provider Last Rate Last Dose  . acetaminophen (TYLENOL) tablet 650 mg  650 mg Oral Q6H PRN Kerry HoughSpencer E Simon, PA-C   650 mg at 04/12/16 2323  . alum & mag hydroxide-simeth (MAALOX/MYLANTA) 200-200-20 MG/5ML suspension 30 mL  30 mL Oral Q4H PRN Kerry HoughSpencer E Simon, PA-C   30 mL at 04/11/16 2047  . benztropine (COGENTIN) tablet 1 mg  1 mg Oral QHS Jomarie LongsSaramma Meryle Pugmire, MD   1 mg at 04/12/16 2125  . diphenhydrAMINE (BENADRYL) injection 50 mg  50 mg Intramuscular Once Laveda AbbeLaurie Britton Parks, NP      . furosemide (LASIX) tablet 40 mg  40 mg Oral Daily Richarda OverlieNayana Abrol, MD   40 mg at 04/13/16 0811  . gabapentin (NEURONTIN) capsule 600 mg  600 mg Oral QHS Brunilda Eble, MD   600 mg at 04/12/16 2124  . haloperidol (HALDOL) tablet 10 mg  10 mg Oral Once Laveda AbbeLaurie Britton Parks, NP       Or  . haloperidol lactate (HALDOL) injection 10 mg  10 mg Intramuscular Once Laveda AbbeLaurie Britton Parks, NP      .  insulin aspart (novoLOG) injection 0-15 Units  0-15 Units Subcutaneous TID WC Jomarie Longs, MD   2 Units at 04/10/16 1717  . insulin aspart (novoLOG) injection 0-5 Units  0-5 Units Subcutaneous QHS Quashawn Jewkes, MD      . lactulose (CHRONULAC) 10 GM/15ML solution 30 g  30 g Oral QID Belkys A Regalado, MD   30 g at 04/12/16 1656  . lithium carbonate capsule 300 mg  300 mg Oral BID WC Jomarie Longs, MD   300 mg at 04/13/16 0811  . LORazepam (ATIVAN) tablet 1 mg  1 mg Oral Q6H PRN Jomarie Longs, MD   1 mg at 04/13/16 0810   Or  . LORazepam (ATIVAN) injection 1 mg  1 mg Intramuscular Q6H PRN Jomarie Longs, MD      . magnesium hydroxide (MILK OF MAGNESIA) suspension 30 mL  30 mL Oral Daily PRN Kerry Hough, PA-C      . magnesium oxide (MAG-OX) tablet  400 mg  400 mg Oral BID Richarda Overlie, MD   400 mg at 04/13/16 0811  . nicotine polacrilex (NICORETTE) gum 2 mg  2 mg Oral PRN Jomarie Longs, MD      . OLANZapine zydis (ZYPREXA) disintegrating tablet 20 mg  20 mg Oral QHS Jomarie Longs, MD   20 mg at 04/12/16 2124  . OLANZapine zydis (ZYPREXA) disintegrating tablet 5 mg  5 mg Oral Daily Jomarie Longs, MD   5 mg at 04/13/16 0811  . Oxcarbazepine (TRILEPTAL) tablet 300 mg  300 mg Oral BID Eyoel Throgmorton, MD      . potassium chloride SA (K-DUR,KLOR-CON) CR tablet 40 mEq  40 mEq Oral Daily Belkys A Regalado, MD   40 mEq at 04/13/16 0810  . propranolol (INDERAL) tablet 10 mg  10 mg Oral TID Jomarie Longs, MD   10 mg at 04/13/16 0811  . temazepam (RESTORIL) capsule 30 mg  30 mg Oral QHS Jomarie Longs, MD   30 mg at 04/12/16 2124  . traZODone (DESYREL) tablet 25 mg  25 mg Oral QHS Jomarie Longs, MD   25 mg at 04/12/16 2125   Lab Results:  Results for orders placed or performed during the hospital encounter of 03/11/16 (from the past 48 hour(s))  Glucose, capillary     Status: Abnormal   Collection Time: 04/11/16  5:10 PM  Result Value Ref Range   Glucose-Capillary 123 (H) 65 - 99 mg/dL  Glucose, capillary     Status: Abnormal   Collection Time: 04/11/16  8:29 PM  Result Value Ref Range   Glucose-Capillary 144 (H) 65 - 99 mg/dL   Comment 1 Notify RN    Comment 2 Document in Chart   CBC     Status: Abnormal   Collection Time: 04/12/16  6:12 AM  Result Value Ref Range   WBC 12.7 (H) 4.0 - 10.5 K/uL   RBC 3.84 (L) 3.87 - 5.11 MIL/uL   Hemoglobin 11.4 (L) 12.0 - 15.0 g/dL   HCT 16.1 (L) 09.6 - 04.5 %   MCV 92.2 78.0 - 100.0 fL   MCH 29.7 26.0 - 34.0 pg   MCHC 32.2 30.0 - 36.0 g/dL   RDW 40.9 (H) 81.1 - 91.4 %   Platelets 300 150 - 400 K/uL    Comment: Performed at [facility], [address], [place], [place] [address]  Glucose, capillary     Status: None   Collection Time: 04/12/16  6:14 AM  Result Value Ref Range  Glucose-Capillary 75 65 - 99 mg/dL  Glucose, capillary     Status: Abnormal   Collection Time: 04/12/16 11:53 AM  Result Value Ref Range   Glucose-Capillary 103 (H) 65 - 99 mg/dL  Glucose, capillary     Status: Abnormal   Collection Time: 04/12/16  4:43 PM  Result Value Ref Range   Glucose-Capillary 120 (H) 65 - 99 mg/dL  Glucose, capillary     Status: Abnormal   Collection Time: 04/12/16  8:36 PM  Result Value Ref Range   Glucose-Capillary 119 (H) 65 - 99 mg/dL  Glucose, capillary     Status: None   Collection Time: 04/13/16  6:26 AM  Result Value Ref Range   Glucose-Capillary 98 65 - 99 mg/dL  Glucose, capillary     Status: None   Collection Time: 04/13/16 11:35 AM  Result Value Ref Range   Glucose-Capillary 87 65 - 99 mg/dL   Comment 1 Notify RN    Comment 2 Document in Chart     Blood Alcohol level:  Lab Results  Component Value Date   ETH <5 03/11/2016   ETH <5 01/05/2016   Metabolic Disorder Labs: Lab Results  Component Value Date   HGBA1C 5.2 03/13/2016   MPG 103 03/13/2016   Lab Results  Component Value Date   PROLACTIN 27.9 (H) 03/13/2016   Lab Results  Component Value Date   CHOL 150 03/13/2016   TRIG 69 03/13/2016   HDL 43 03/13/2016   CHOLHDL 3.5 03/13/2016   VLDL 14 03/13/2016   LDLCALC 93 03/13/2016   Physical Findings: AIMS: Facial and Oral Movements Muscles of Facial Expression: None, normal Lips and Perioral Area: None, normal Jaw: None, normal Tongue: None, normal,Extremity Movements Upper (arms, wrists, hands, fingers): None, normal Lower (legs, knees, ankles, toes): None, normal, Trunk Movements Neck, shoulders, hips: None, normal, Overall Severity Severity of abnormal movements (highest score from questions above): None, normal Incapacitation due to abnormal movements: None, normal Patient's awareness of abnormal movements (rate only patient's report): No Awareness, Dental Status Current problems with teeth and/or dentures?:  No Does patient usually wear dentures?: No  CIWA:  CIWA-Ar Total: 1 COWS:  COWS Total Score: 2  Musculoskeletal: Strength & Muscle Tone: within normal limits Gait & Station: normal Patient leans: N/A  Psychiatric Specialty Exam: Physical Exam  Nursing note and vitals reviewed.   Review of Systems  All other systems reviewed and are negative.   Blood pressure 96/67, pulse 81, temperature 98.2 F (36.8 C), temperature source Oral, resp. rate 16, height 5\' 5"  (1.651 m), weight 92.5 kg (204 lb), SpO2 99 %.Body mass index is 33.95 kg/m.  General Appearance: Fairly Groomed  Eye Contact:  Fair  Speech:  Pressured varies   Volume:  varies   Mood:  labile on and off, improving  Affect:  congruent  Thought Process:  Linear and Descriptions of Associations: Circumstantial   Orientation:  Full (Time, Place, and Person)  Thought Content:  Delusions and Rumination makes delusional statements on and off   Suicidal Thoughts:  No  Homicidal Thoughts:  No  Memory:  Immediate;   Fair Recent;   Fair Remote;   Poor  Judgement:  Impaired  Insight:  Shallow  Psychomotor Activity:  Restlessness  Concentration:  Concentration: Fair and Attention Span: Fair  Recall:  [employer] of Knowledge:  Fair  Language:  Fair  Akathisia:  No  Handed:  Right  AIMS (if indicated):     Assets:  Desire for  Improvement Talents/Skills  ADL's:  Intact  Cognition:  WNL  Sleep:  Number of Hours: 5.75   Schizoaffective disorder, bipolar type (HCC) improving  Will continue today 04/13/16  plan as below except where it is noted.   Treatment plan: Patient today seen as anxious , labile at times , but is redirectable. Pt was found to have hyperammoniemia , likely VPA induced . Will recheck ammonia , will discontinue depakote.  CSW will continue to work on payee service referral, support services.   Daily contact with patient to assess and evaluate symptoms and progress in treatment and Medication management    For schizoaffective do: reviewed as below Will continue Zyprexa 20 mg po bedtime. Will continue Zyprexa 5 mg daily.  For EPS: Will continue Cogentin 1 mg po qhs.   For agitation/Restlessness: Will continue Neurontin 600 mg po qhs.  For mood lability: Will increase Trilpetal to 300 mg po bid. Will continue Lithium 300 mg po bid. Will get Li level - 04/14/2016. Will discontinue Depakote , likely causing hyperammonemia.   For insomnia: Will continue Restoril 30 mg po qhs. Will continue Trazodone 25 mg po qhs (will be cautious due to h/o qtc prolongation). Patient failed Ambien, Lunesta , doxepin , elavil  Trials)   For restlessness/akathisia:reviewed as below Will continue Propranolol to 10 mg po tid.  For pedal edema: Lasix 40 mg po daily. As per hospitalist consult.  For hyperammonemia: Continue Lactulose. Will repeat ammonia level. DC depakote.   H/O prolonged QTC on EKG and she is also on Lithium: Repeat EKG - shows qtc - 456, nonspecific T wave abnormality/NSR.  Given PPD test 03/19/16 - for ALF placement- - negative .  DM: Will continue current medication regimen.  Home health consult placed- pending.  CSW will work on referral to day program / TCT.  Pt referred to [facility] . Pt also could be discharged back to her boarding home - if she improves. Family meeting was conducted on 04/07/16.   Myleen Brailsford, MD 04/13/2016, 1:01 PM Patient ID: Catherine Bender, female   DOB: [birth year], 46 y.o.   MRN: [id number]

## 2016-04-13 NOTE — Progress Notes (Signed)
Adult Psychoeducational Group Note  Date:  04/13/2016 Time:  9:36 PM  Group Topic/Focus:  Wrap-Up Group:   The focus of this group is to help patients review their daily goal of treatment and discuss progress on daily workbooks.  Participation Level:  Active  Participation Quality:  Appropriate  Affect:  Appropriate  Cognitive:  Alert  Insight: Appropriate  Engagement in Group:  Engaged  Modes of Intervention:  Discussion  Additional Comments:  Patient expressed that she had a great day. Patient rated her day a 10. Patient's goal for today was to go home. Patient stated she will be discharging tomorrow.  Catherine Bender 04/13/2016, 9:36 PM

## 2016-04-14 LAB — LITHIUM LEVEL: Lithium Lvl: 0.32 mmol/L — ABNORMAL LOW (ref 0.60–1.20)

## 2016-04-14 LAB — GLUCOSE, CAPILLARY
GLUCOSE-CAPILLARY: 78 mg/dL (ref 65–99)
GLUCOSE-CAPILLARY: 105 mg/dL — AB (ref 65–99)
GLUCOSE-CAPILLARY: 132 mg/dL — AB (ref 65–99)

## 2016-04-14 LAB — AMMONIA: Ammonia: 76 umol/L — ABNORMAL HIGH (ref 9–35)

## 2016-04-14 MED ORDER — LITHIUM CARBONATE 300 MG PO CAPS
300.0000 mg | ORAL_CAPSULE | Freq: Three times a day (TID) | ORAL | Status: DC
  Administered 2016-04-14 – 2016-04-16 (×6): 300 mg via ORAL
  Filled 2016-04-14: qty 21
  Filled 2016-04-14 (×7): qty 1
  Filled 2016-04-14: qty 21
  Filled 2016-04-14: qty 1
  Filled 2016-04-14: qty 21
  Filled 2016-04-14 (×3): qty 1

## 2016-04-14 NOTE — Progress Notes (Signed)
[facility]  04/14/2016 3:41 PM Catherine LimberRenitha Bender  MRN:  [id number]   Subjective: Patient states " I am ok. I can go home with my husband.'    Objective:Patient seen and chart reviewed.Discussed patient with treatment team.  Pt today seen as pressured often , continues to need redirection. Pt also with random delusional statements - like donald trump is my cousin - seems to be chronic fixed. Pt with hyperammonemia - is on lactulose, VPA discontinued , repeat ammonia level - trending low today. Will continue to observe on the unit.  Principal Problem: Schizoaffective disorder, bipolar type (HCC) Diagnosis:   Patient Active Problem List   Diagnosis Date Noted  . Hyperammonemia (HCC) [E72.20] 04/13/2016  . Agitation [R45.1]   . Change in behavior [R46.89]   . Pedal edema [R60.0] 04/06/2016  . Electrolyte imbalance [E87.8] 03/27/2016  . Prolonged Q-T interval on ECG [R94.31] 03/13/2016  . Schizoaffective disorder, bipolar type (HCC) [F25.0] 03/12/2016  . Diabetes mellitus (HCC) [E11.9] 03/12/2016   Total Time spent with patient: 25 minutes  Past Psychiatric History: Please see H&P.   Past Medical History:  Past Medical History:  Diagnosis Date  . Diabetes mellitus without complication [facility](HCC)     Past Surgical History:  Procedure Laterality Date  . umbical hernia repair     Family History: Please see H&P.  Family Psychiatric  History: Please see H&P.  Social History: Please see H&P.  History  Alcohol Use No     History  Drug Use No    Social History   Social History  . Marital status: Single    Spouse name: N/A  . Number of children: N/A  . Years of education: N/A   Social History Main Topics  . Smoking status: Current Every Day Smoker    Packs/day: 0.25  . Smokeless tobacco: Never Used  . Alcohol use No  . Drug use: No  . Sexual activity: Not Asked   Other Topics Concern  . None   Social History Narrative  . None   Additional Social History:    Sleep: Fair   Appetite:  Fair  Current Medications: Current Facility-Administered Medications  Medication Dose Route Frequency Provider Last Rate Last Dose  . acetaminophen (TYLENOL) tablet 650 mg  650 mg Oral Q6H PRN Kerry HoughSpencer E Simon, PA-C   650 mg at 04/14/16 0836  . alum & mag hydroxide-simeth (MAALOX/MYLANTA) 200-200-20 MG/5ML suspension 30 mL  30 mL Oral Q4H PRN Kerry HoughSpencer E Simon, PA-C   30 mL at 04/11/16 2047  . benztropine (COGENTIN) tablet 1 mg  1 mg Oral QHS Catherine LongsSaramma Iman Orourke, MD   1 mg at 04/13/16 2132  . diphenhydrAMINE (BENADRYL) injection 50 mg  50 mg Intramuscular Once Laveda AbbeLaurie Britton Parks, NP      . furosemide (LASIX) tablet 40 mg  40 mg Oral Daily Richarda OverlieNayana Abrol, MD   40 mg at 04/14/16 [id number]  . gabapentin (NEURONTIN) capsule 600 mg  600 mg Oral QHS Catherine LongsSaramma Lamount Bankson, MD   600 mg at 04/13/16 2132  . guaiFENesin (ROBITUSSIN) 100 MG/5ML solution 100 mg  5 mL Oral Q4H PRN Creedon Danielski, MD      . insulin aspart (novoLOG) injection 0-15 Units  0-15 Units Subcutaneous TID WC Catherine LongsSaramma Traver Meckes, MD   2 Units at 04/10/16 1717  . insulin aspart (novoLOG) injection 0-5 Units  0-5 Units Subcutaneous QHS Linell Meldrum, MD      . lactulose (CHRONULAC) 10 GM/15ML solution 30 g  30 g Oral QID  Belkys A Regalado, MD   30 g at 04/14/16 1200  . lithium carbonate capsule 300 mg  300 mg Oral TID WC Lenore Moyano, MD   300 mg at 04/14/16 1200  . LORazepam (ATIVAN) tablet 1 mg  1 mg Oral Q6H PRN Catherine Longs, MD   1 mg at 04/13/16 0810   Or  . LORazepam (ATIVAN) injection 1 mg  1 mg Intramuscular Q6H PRN Catherine Longs, MD      . magnesium hydroxide (MILK OF MAGNESIA) suspension 30 mL  30 mL Oral Daily PRN Kerry Hough, PA-C      . magnesium oxide (MAG-OX) tablet 400 mg  400 mg Oral BID Richarda Overlie, MD   400 mg at 04/14/16 0843  . nicotine polacrilex (NICORETTE) gum 2 mg  2 mg Oral PRN Catherine Longs, MD      . OLANZapine zydis (ZYPREXA) disintegrating tablet 20 mg  20 mg Oral QHS Catherine Longs, MD    20 mg at 04/13/16 2133  . OLANZapine zydis (ZYPREXA) disintegrating tablet 5 mg  5 mg Oral Daily Catherine Longs, MD   5 mg at 04/14/16 0839  . Oxcarbazepine (TRILEPTAL) tablet 300 mg  300 mg Oral BID Catherine Longs, MD   300 mg at 04/14/16 0837  . potassium chloride SA (K-DUR,KLOR-CON) CR tablet 40 mEq  40 mEq Oral Daily Belkys A Regalado, MD   40 mEq at 04/14/16 0837  . propranolol (INDERAL) tablet 10 mg  10 mg Oral TID Catherine Longs, MD   10 mg at 04/14/16 1219  . temazepam (RESTORIL) capsule 30 mg  30 mg Oral QHS Catherine Longs, MD   30 mg at 04/13/16 2132  . traZODone (DESYREL) tablet 25 mg  25 mg Oral QHS Catherine Longs, MD   25 mg at 04/13/16 2132   Lab Results:  Results for orders placed or performed during the hospital encounter of 03/11/16 (from the past 48 hour(s))  Glucose, capillary     Status: Abnormal   Collection Time: 04/12/16  4:43 PM  Result Value Ref Range   Glucose-Capillary 120 (H) 65 - 99 mg/dL  Glucose, capillary     Status: Abnormal   Collection Time: 04/12/16  8:36 PM  Result Value Ref Range   Glucose-Capillary 119 (H) 65 - 99 mg/dL  Glucose, capillary     Status: None   Collection Time: 04/13/16  6:26 AM  Result Value Ref Range   Glucose-Capillary 98 65 - 99 mg/dL  Glucose, capillary     Status: None   Collection Time: 04/13/16 11:35 AM  Result Value Ref Range   Glucose-Capillary 87 65 - 99 mg/dL   Comment 1 Notify RN    Comment 2 Document in Chart   Glucose, capillary     Status: Abnormal   Collection Time: 04/13/16  5:21 PM  Result Value Ref Range   Glucose-Capillary 117 (H) 65 - 99 mg/dL   Comment 1 Notify RN    Comment 2 Document in Chart   Glucose, capillary     Status: Abnormal   Collection Time: 04/13/16  8:45 PM  Result Value Ref Range   Glucose-Capillary 110 (H) 65 - 99 mg/dL  Glucose, capillary     Status: None   Collection Time: 04/14/16  6:20 AM  Result Value Ref Range   Glucose-Capillary 78 65 - 99 mg/dL  Lithium level     Status:  Abnormal   Collection Time: 04/14/16  6:34 AM  Result Value Ref Range  Lithium Lvl 0.32 (L) 0.60 - 1.20 mmol/L    Comment: Performed at [facility], [address] [place], [place] [address]  Ammonia     Status: Abnormal   Collection Time: 04/14/16  6:34 AM  Result Value Ref Range   Ammonia 76 (H) 9 - 35 umol/L    Comment: Performed at [facility], [address] [place], [place] [address]  Glucose, capillary     Status: Abnormal   Collection Time: 04/14/16 12:14 PM  Result Value Ref Range   Glucose-Capillary 105 (H) 65 - 99 mg/dL   Comment 1 Notify RN     Blood Alcohol level:  Lab Results  Component Value Date   ETH <5 03/11/2016   ETH <5 01/05/2016   Metabolic Disorder Labs: Lab Results  Component Value Date   HGBA1C 5.2 03/13/2016   MPG 103 03/13/2016   Lab Results  Component Value Date   PROLACTIN 27.9 (H) 03/13/2016   Lab Results  Component Value Date   CHOL 150 03/13/2016   TRIG 69 03/13/2016   HDL 43 03/13/2016   CHOLHDL 3.5 03/13/2016   VLDL 14 03/13/2016   LDLCALC 93 03/13/2016   Physical Findings: AIMS: Facial and Oral Movements Muscles of Facial Expression: None, normal Lips and Perioral Area: None, normal Jaw: None, normal Tongue: None, normal,Extremity Movements Upper (arms, wrists, hands, fingers): None, normal Lower (legs, knees, ankles, toes): None, normal, Trunk Movements Neck, shoulders, hips: None, normal, Overall Severity Severity of abnormal movements (highest score from questions above): None, normal Incapacitation due to abnormal movements: None, normal Patient's awareness of abnormal movements (rate only patient's report): No Awareness, Dental Status Current problems with teeth and/or dentures?: No Does patient usually wear dentures?: No  CIWA:  CIWA-Ar Total: 1 COWS:  COWS Total Score: 2  Musculoskeletal: Strength & Muscle Tone: within normal limits Gait & Station: normal Patient leans:  N/A  Psychiatric Specialty Exam: Physical Exam  Nursing note and vitals reviewed.   Review of Systems  Psychiatric/Behavioral: The patient is nervous/anxious.   All other systems reviewed and are negative.   Blood pressure 135/85, pulse 86, temperature 98.4 F (36.9 C), temperature source Oral, resp. rate 18, height 5\' 5"  (1.651 m), weight 92.5 kg (204 lb), SpO2 99 %.Body mass index is 33.95 kg/m.  General Appearance: Fairly Groomed  Eye Contact:  Fair  Speech:  Pressuredon and off  Volume:  varies   Mood:  labile, improving  Affect:  varies  Thought Process:  Linear and Descriptions of Associations: Circumstantial   Orientation:  Full (Time, Place, and Person)  Thought Content:  Delusions and Rumination seems to be chronic, fixed  Suicidal Thoughts:  No  Homicidal Thoughts:  No  Memory:  Immediate;   Fair Recent;   Fair Remote;   Poor  Judgement:  Impaired  Insight:  Shallow  Psychomotor Activity:  Normal  Concentration:  Concentration: Fair and Attention Span: Fair  Recall:  [employer] of Knowledge:  Fair  Language:  Fair  Akathisia:  No  Handed:  Right  AIMS (if indicated):     Assets:  Desire for Improvement Talents/Skills  ADL's:  Intact  Cognition:  WNL  Sleep:  Number of Hours: 6.25   Schizoaffective disorder, bipolar type (HCC) improving  Will continue today 04/14/16  plan as below except where it is noted.    Treatment plan: Patient today seen as pressured often , is labile at times , her Dierdre Searles level is low,  will readjust, her ammonia level continues to be high - will continue Lactulose, recheck.  CSW will work on aftercare referral, dayprogram, possible TCT if she agrees and home health for her physical needs.   Daily contact with patient to assess and evaluate symptoms and progress in treatment and Medication management   For schizoaffective do: reviewed as below Will continue Zyprexa 20 mg po bedtime. Will continue Zyprexa 5 mg daily.  For  EPS: Will continue Cogentin 1 mg po qhs.   For agitation/Restlessness: Will continue Neurontin 600 mg po qhs.  For mood lability: Will increase Trilpetal to 300 mg po bid. Will increase Lithium to 300 mg po tid. Li level today - 04/14/16 - subtherapeutic.Results for KANIJA, REMMEL (MRN [id number]) as of 04/14/2016 15:39  Ref. Range 04/14/2016 06:34  Lithium Latest Ref Range: 0.60 - 1.20 mmol/L 0.32 (L)   Will discontinue Depakote , likely causing hyperammonemia.   For insomnia: Will continue Restoril 30 mg po qhs. Will continue Trazodone 25 mg po qhs (will be cautious due to h/o qtc prolongation). Patient failed Ambien, Lunesta , doxepin , elavil  Trials)   For restlessness/akathisia:reviewed as below Will continue Propranolol to 10 mg po tid.  For pedal edema: Lasix 40 mg po daily. As per hospitalist consult.  For hyperammonemia: Continue Lactulose. Ammonia level today - trending low - Results for TOMESHIA, PIZZI (MRN [id number]) as of 04/14/2016 15:39  Ref. Range 04/14/2016 06:34  Ammonia Latest Ref Range: 9 - 35 umol/L 76 (H)   Repeat ammonia level tomorrow. DC depakote.   H/O prolonged QTC on EKG and she is also on Lithium: Repeat EKG - shows qtc - 456, nonspecific T wave abnormality/NSR.  Given PPD test 03/19/16 - for ALF placement- - negative .  DM: Will continue current medication regimen.  Home health consult placed- pending.  CSW will work on referral to day program / TCT.  Pt referred to [facility] . Pt also could be discharged back to her boarding home - if she improves. Family meeting was conducted on 04/07/16.   Bobbie Virden, MD 04/14/2016, 3:41 PM Patient ID: Catherine Bender, female   DOB: 1970-10-13, 46 y.o.   MRN: [id number]

## 2016-04-14 NOTE — Progress Notes (Signed)
Adult Psychoeducational Group Note  Date:  04/14/2016 Time:  8:46 PM  Group Topic/Focus:  Wrap-Up Group:   The focus of this group is to help patients review their daily goal of treatment and discuss progress on daily workbooks.  Participation Level:  Active  Participation Quality:  Appropriate  Affect:  Appropriate  Cognitive:  Alert  Insight: Appropriate  Engagement in Group:  Engaged  Modes of Intervention:  Discussion  Additional Comments:  Patient expressed that she had a good day.  Tiney Zipper L Ade Stmarie 04/14/2016, 8:46 PM

## 2016-04-14 NOTE — Progress Notes (Signed)
DAR NOTE: Patient presents with anxious affect and mood.  Denies pain, auditory and visual hallucinations.  Patient continues to need a lot of redirection on the unit.  Rates depression at 0, hopelessness at 0, and anxiety at 0.  Maintained on routine safety checks.  Medications given as prescribed.  Support and encouragement offered as needed.  Attended group and participated.  States goal for today is "to go home."  Patient observed socializing with peers in the dayroom.  Offered no complaint.

## 2016-04-14 NOTE — Progress Notes (Signed)
Recreation Therapy Notes  Date: 04/14/16 Time: 1000 Location: 500 Hall Dayroom  Group Topic: Leisure Education  Goal Area(s) Addresses:  Patient will identify positive leisure activities.  Patient will identify one positive benefit of participation in leisure activities.   Behavioral Response: Minimal  Intervention: Various activities, dry erase board, dry erase marker, eraser  Activity: Leisure Pictionary.  Patients were to pull a slip of paper with a leisure activity on from a can.  The patient is to then draw the picture on the board and the remaining patients are to guess what the picture is.  The person guesses correctly, will then get the opportunity to draw another activity on the board.  Education:  Leisure Education, Building control surveyorDischarge Planning  Education Outcome: Acknowledges education/In group clarification offered/Needs additional education  Clinical Observations/Feedback:  Pt drew one picture and attempted to guess at points during group.  Pt had to be redirected from singing during group.  Pt was also focused on her peer and was interacting with him.    Caroll RancherMarjette Miri Jose, LRT/CTRS      Caroll RancherLindsay, Jerah Esty A 04/14/2016 11:41 AM

## 2016-04-15 LAB — GLUCOSE, CAPILLARY
GLUCOSE-CAPILLARY: 103 mg/dL — AB (ref 65–99)
Glucose-Capillary: 122 mg/dL — ABNORMAL HIGH (ref 65–99)
Glucose-Capillary: 94 mg/dL (ref 65–99)
Glucose-Capillary: 94 mg/dL (ref 65–99)
Glucose-Capillary: 107 mg/dL — ABNORMAL HIGH (ref 65–99)

## 2016-04-15 LAB — AMMONIA: Ammonia: 29 umol/L (ref 9–35)

## 2016-04-15 MED ORDER — LACTULOSE 10 GM/15ML PO SOLN
30.0000 g | Freq: Four times a day (QID) | ORAL | Status: AC
  Filled 2016-04-15 (×2): qty 45

## 2016-04-15 NOTE — Progress Notes (Signed)
D: Pt continues to be loud, intrusive and delusional-grandeur.  Pt complained of moderate bilateral foot pain. Pt however denies depression, anxiety, SI, HI or AVH; state; "you know I work here." Pt claimed that her prescribed lactulose is making her to bleed; Pt became very aggressive to the RN when RN told her there was no blood in her stool-used several profane languages to qualify the RN.  A: Medications offered as prescribed. All patient's questions and concerns addressed. Support, encouragement, and safe environment provided. 15-minute safety checks continue.   R: Pt was med compliant.  Pt attended wrap-up group. Pt was physically aggressive to the RN. Safety checks continue

## 2016-04-15 NOTE — Progress Notes (Signed)
[facility] Group Notes:  (Nursing/MHT/Case Management/Adjunct)  Date:  04/15/2016  Time:  9:22 PM  Type of Therapy:  Psychoeducational Skills  Participation Level:  Active  Participation Quality:  Appropriate  Affect:  Excited  Cognitive:  Disorganized  Insight:  Lacking  Engagement in Group:  Distracting  Modes of Intervention:  Education  Summary of Progress/Problems: The patient stated that she had a good day overall. She verbalized that spent some time dancing and singing and that she wants to be discharged. The patient had to be redirected for talking out of turn and for giving her peers advice. As for the theme of the day, her personal development will involve returning to work and go back to college.   Deylan Canterbury S 04/15/2016, 9:22 PM

## 2016-04-15 NOTE — Tx Team (Signed)
Interdisciplinary Treatment and Diagnostic Plan Update  04/15/2016 Time of Session: 12:40 PM  Catherine Bender MRN: [id number]  Principal Diagnosis: Schizoaffective disorder, bipolar type (HCC)  Secondary Diagnoses: Principal Problem:   Schizoaffective disorder, bipolar type (HCC) Active Problems:   Prolonged Q-T interval on ECG   Electrolyte imbalance   Pedal edema   Agitation   Change in behavior   Hyperammonemia (HCC)   Current Medications:  Current Facility-Administered Medications  Medication Dose Route Frequency Provider Last Rate Last Dose  . acetaminophen (TYLENOL) tablet 650 mg  650 mg Oral Q6H PRN Kerry Hough, PA-C   650 mg at 04/14/16 0836  . alum & mag hydroxide-simeth (MAALOX/MYLANTA) 200-200-20 MG/5ML suspension 30 mL  30 mL Oral Q4H PRN Kerry Hough, PA-C   30 mL at 04/11/16 2047  . benztropine (COGENTIN) tablet 1 mg  1 mg Oral QHS Jomarie Longs, MD   1 mg at 04/14/16 2125  . diphenhydrAMINE (BENADRYL) injection 50 mg  50 mg Intramuscular Once Laveda Abbe, NP      . furosemide (LASIX) tablet 40 mg  40 mg Oral Daily Richarda Overlie, MD   40 mg at 04/15/16 4098  . gabapentin (NEURONTIN) capsule 600 mg  600 mg Oral QHS Jomarie Longs, MD   600 mg at 04/14/16 2125  . guaiFENesin (ROBITUSSIN) 100 MG/5ML solution 100 mg  5 mL Oral Q4H PRN Saramma Eappen, MD      . insulin aspart (novoLOG) injection 0-15 Units  0-15 Units Subcutaneous TID WC Jomarie Longs, MD   2 Units at 04/14/16 1739  . insulin aspart (novoLOG) injection 0-5 Units  0-5 Units Subcutaneous QHS Saramma Eappen, MD      . lactulose (CHRONULAC) 10 GM/15ML solution 30 g  30 g Oral QID Saramma Eappen, MD      . lithium carbonate capsule 300 mg  300 mg Oral TID WC Saramma Eappen, MD   300 mg at 04/15/16 1143  . LORazepam (ATIVAN) tablet 1 mg  1 mg Oral Q6H PRN Jomarie Longs, MD   1 mg at 04/15/16 1142   Or  . LORazepam (ATIVAN) injection 1 mg  1 mg Intramuscular Q6H PRN Jomarie Longs, MD      .  magnesium hydroxide (MILK OF MAGNESIA) suspension 30 mL  30 mL Oral Daily PRN Kerry Hough, PA-C      . magnesium oxide (MAG-OX) tablet 400 mg  400 mg Oral BID Richarda Overlie, MD   400 mg at 04/15/16 0821  . nicotine polacrilex (NICORETTE) gum 2 mg  2 mg Oral PRN Jomarie Longs, MD      . OLANZapine zydis (ZYPREXA) disintegrating tablet 20 mg  20 mg Oral QHS Jomarie Longs, MD   20 mg at 04/14/16 2125  . OLANZapine zydis (ZYPREXA) disintegrating tablet 5 mg  5 mg Oral Daily Jomarie Longs, MD   5 mg at 04/15/16 1191  . Oxcarbazepine (TRILEPTAL) tablet 300 mg  300 mg Oral BID Jomarie Longs, MD   300 mg at 04/15/16 0820  . potassium chloride SA (K-DUR,KLOR-CON) CR tablet 40 mEq  40 mEq Oral Daily Belkys A Regalado, MD   40 mEq at 04/15/16 0820  . propranolol (INDERAL) tablet 10 mg  10 mg Oral TID Jomarie Longs, MD   10 mg at 04/15/16 1149  . temazepam (RESTORIL) capsule 30 mg  30 mg Oral QHS Jomarie Longs, MD   30 mg at 04/14/16 2125  . traZODone (DESYREL) tablet 25 mg  25 mg Oral QHS  Jomarie Longs, MD   25 mg at 04/14/16 2125    PTA Medications: Prescriptions Prior to Admission  Medication Sig Dispense Refill Last Dose  . LITHIUM PO Take 1 tablet by mouth once.   Not Taking at Unknown time    Treatment Modalities: Medication Management, Group therapy, Case management,  1 to 1 session with clinician, Psychoeducation, Recreational therapy.   Physician Treatment Plan for Primary Diagnosis: Schizoaffective disorder, bipolar type (HCC) Long Term Goal(s): Improvement in symptoms so as ready for discharge  Short Term Goals: Compliance with prescribed medications will improve  Medication Management: Evaluate patient's response, side effects, and tolerance of medication regimen.  Therapeutic Interventions: 1 to 1 sessions, Unit Group sessions and Medication administration.  Evaluation of Outcomes: Adequate for Discharge   2/15:  Will increase  Zyprexa to 20  mg po daily in divided  doses. Dose adjustment to be done cautiously due to h/o QTC prolongation as well as her inability to walk , drowsiness. ( per daughter - at baseline she takes care of self and is better functioning - is not wheelchair bound) . Repeat EKG ( 03/17/16)- shows qtc- improving - however likely due to dose reduction of offending agent. EKG  ( 03/19/16)- qtc wnl. EKG - 03/25/16 - qtc - wnl . Marland Kitchen  Cogentin 0.5 mg po qhs for EPS. Will continue Depakote DR 500 mg po tid for mood sx. Depakote level on 03/18/16- 83 ( therapeutic)  Will continue Neurontin 400 mg po qhs for anxiety/restlessness. Will increase Trilpetal to 300 mg po bid - initiated yesterday at 150 mg po bid . She will receive an increase of 300 mg today and 300 mg bid tomorrow.  For insomnia: Discontinue Ambien due to lack of efficacy . Discontinue  Lunesta due to lack of efficacy , failed ambien trial , due to qtc prolongation - cannot be on some of the other sleep aids like doxepin, elavil , failed trazodone . Will increase Restoril to 30  mg po qhs today.  For hx of prolonged QTC: Repeat EKG showed qtc improving - will continue to monitor.Repeat EKG - 03/19/16- wnl .  03/23/16 - qt - 416 , qtc - 470 - will continue to monitor.Repeat EKG today- 03/25/16- qtc - wnl.  2/21:  For schizoaffective ZO:[id number] as below - no changes made today Continue Zyprexa 25 mg po daily in divided doses for psychosis/mood sx. Depakote to be changed to Depakote DR 500 mg po daily and 1000 mg po qhs . Dosing schedule changed to help her sleep better , reduce restlessness at night. Increase Neurontin to 600 mg po qhs for restlessness/anxiety sx. Will continue cogentin 1 mg po qhs for EPS. Will continue Trilpetal 150 mg po bid . Could taper it off, once she is more stable on Li . Will continue Li 150 mg po bid - to augment her depakote . Li level on 04/04/16.    Insomnia:reviewed - no changes made Will continue Restoril 30 mg po qhs - she slept few nights on the  same , but last night was restless again. Add trazodone 25 mg po qhs to augment the restoril , will be cautious since she also has a hx of EKG prolongation. ( Patient failed [place] , doxepin , elavil  Trials)   2/26: Continue Zyprexa 25 mg po daily in divided doses for psychosis/mood sx. Will continue Depakote DR 500 mg po daily and 1000 mg po qhs . Dosing schedule changed to help her sleep better ,  reduce restlessness at night. Will continue Neurontin to 600 mg po qhs for restlessness/anxiety sx. Will continue cogentin 1 mg po qhs for EPS. Will continue Trileptal 150 mg po bid . Could taper it off, once she is more stable on Li . Continue Li 150 mg po bid to augment Depakote. Will get BMP since pt is on LI , as well has pedal edema.  3/1: Zyprexa 25 mg po daily in divided doses. Cogentin 1 mg po qhs for EPS. Depakote DR 500 mg po daily and 1000 mg po qhs. Neurontin 600 mg po qhs for restlessness. Will restart Trileptal at 150 mg po bid. Continue Li 300 mg po bid.  Last level was subtherapeutic.Next Li level on 04/11/16.    For insomnia: Restoril 30 mg po qhs. Trazodone 25 mg po qhs ( will be cautious due to h/o qtc prolongation). Patient failed Ambien, Lunesta , doxepin , elavil  Trials)    For restlessness/akathisia:reviewed as below Will increase Propranolol to 10 mg po tid.  For pedal edema: Per hospitalist recommendations - see consult note.  Physician Treatment Plan for Secondary Diagnosis: Principal Problem:   Schizoaffective disorder, bipolar type (HCC) Active Problems:   Prolonged Q-T interval on ECG   Electrolyte imbalance   Pedal edema   Agitation   Change in behavior   Hyperammonemia (HCC)   Long Term Goal(s): Improvement in symptoms so as ready for discharge  Short Term Goals: Ability to demonstrate self-control will improve  Medication Management: Evaluate patient's response, side effects, and tolerance of medication regimen.  Therapeutic  Interventions: 1 to 1 sessions, Unit Group sessions and Medication administration.  Evaluation of Outcomes: Adequate for Discharge   RN Treatment Plan for Primary Diagnosis: Schizoaffective disorder, bipolar type (HCC) Long Term Goal(s): Knowledge of disease and therapeutic regimen to maintain health will improve  Short Term Goals: Ability to demonstrate self-control and Compliance with prescribed medications will improve  Medication Management: RN will administer medications as ordered by provider, will assess and evaluate patient's response and provide education to patient for prescribed medication. RN will report any adverse and/or side effects to prescribing provider.  Therapeutic Interventions: 1 on 1 counseling sessions, Psychoeducation, Medication administration, Evaluate responses to treatment, Monitor vital signs and CBGs as ordered, Perform/monitor CIWA, COWS, AIMS and Fall Risk screenings as ordered, Perform wound care treatments as ordered.  Evaluation of Outcomes: Adequate for Discharge   LCSW Treatment Plan for Primary Diagnosis: Schizoaffective disorder, bipolar type (HCC) Long Term Goal(s): Safe transition to appropriate next level of care at discharge, Engage patient in therapeutic group addressing interpersonal concerns.  Short Term Goals: Engage patient in aftercare planning with referrals and resources and Increase skills for wellness and recovery  Therapeutic Interventions: Assess for all discharge needs, 1 to 1 time with Social worker, Explore available resources and support systems, Assess for adequacy in community support network, Educate family and significant other(s) on suicide prevention, Complete Psychosocial Assessment, Interpersonal group therapy.  Evaluation of Outcomes: Adequate for Discharge   Progress in Treatment: Attending groups: Intermittently Participating in groups: Not meaningfully Taking medication as prescribed: Yes, MD continues to assess for  medication changes as needed Toleration medication: Yes, no side effects reported at this time Family/Significant other contact made: Yes, Ms. Delene Loll (daughter, [phone]) Patient understands diagnosis: No, limited insight Discussing patient identified problems/goals with staff: No Medical problems stabilized or resolved: Yes, patient now up and walking out of wheelchair Denies suicidal/homicidal ideation: Yes Issues/concerns per patient self-inventory: None Other: N/A  New  problem(s) identified: discharge planning, currently homeless  New Short Term/Long Term Goal(s): Identify a safe discharge plan involving housing.    Discharge Plan or Barriers: Unknown at this time, CSW will continue to follow and assess for options regarding placement  2/12: [facility]SR evaluation pending at this time; CSW to facilitate search for placement. MD requesting [facility] referral be made 2/15:  P[facility] evaluation on hold; pt not stabilized to the point of readiness for transfer.  Confirmation of [facility] wait list as of 2/14.  CSW attempting to contact mother to involve in dispositional planning 2/21:  Mother is willing to have patient there, and said she [place] contact [place] son [place].  However, pt has been heard multiple times on phone telling mother whe will not come there and is staying locally.  Pt is refusing referral to ALF.  CSW has contacted Jinny SandersBetty Davis about transitional housing-will visit pt on Friday 2/26:  Ms Earlene PlaterDavis interested, but said to call back when pt is less symptomatic.  Will arrange meeting with Ms Earlene PlaterDavis, along with daughter Karma Greaseratasha, [phone], when Dr feels pt has improved to the extent of re-evaluation.  In the meantime, Marcelle Smilingatasha states she will reach out to her mother's previous landlord as he has not responded to my attempts to contact him. 3/1:  Marcelle Smilingatasha came to visit this week-stated landlord is willing to take pt back if she agrees to follow rules and get a payee.  Pt unwilling to  get payee, but we do have necessary paperwork to start the process.  CSW to contact Marcelle Smilingatasha to ask her to contact landlord to negotiate.  In the meantime, Dr Elna BreslowEappen wants her to be seen by homehealth care due to multiple mental health and medication needs. 3/7:  Pt to return to same apartment she was staying at, follow up [facility], and [facility].  Reason for Continuation of Hospitalization: Medication stabilization    Estimated Length of Stay: 1-2 days  Attendees: Patient:  04/15/2016  12:40 PM  Physician: Dr. Elna BreslowEappen 04/15/2016  12:40 PM  Nursing: Esperanza SheetsElizabeth Iwenekha, RN  04/15/2016  12:40 PM  RN Care Manager: Onnie BoerJennifer Clark, CM RN 04/15/2016  12:40 PM  Social Worker: Richelle Itood Melonie Germani LCSW 04/15/2016  12:40 PM  Recreational Therapist: Royston CowperMarjette, RT 04/15/2016  12:40 PM  Other:  04/15/2016  12:40 PM  Other:  04/15/2016  12:40 PM  Other: 04/15/2016  12:40 PM    Scribe for Treatment Team: Richelle Itood Deangelo Berns, LCSW Clinical Social Work [phone]

## 2016-04-15 NOTE — Progress Notes (Signed)
DAR NOTE: Pt present with flat affect and depressed mood in the unit. Pt has been loud, irritable, intrusive and verbally aggressive toward staff. Pt has been observed talking none stop. Pt denies physical pain, took all her meds as scheduled. As per self inventory, pt had a good night sleep, good appetite, normal energy, and good concentration. Pt rate depression at 6, hopeless ness at 6, and anxiety at 8. Pt's safety ensured with 15 minute and environmental checks. Pt currently denies SI/HI and A/V hallucinations. Pt verbally agrees to seek staff if SI/HI or A/VH occurs and to consult with staff before acting on these thoughts. Will continue POC.

## 2016-04-15 NOTE — Progress Notes (Signed)
Recreation Therapy Notes  Date: 04/15/16 Time: 1000 Location: 500 Hall Dayroom  Group Topic: Wellness  Goal Area(s) Addresses:  Patient will define components of whole wellness. Patient will verbalize benefit of whole wellness.  Behavioral Response: Minimal  Intervention:  20 plastic cups, 2 small balls  Activity: Bowling.  Patients were divided into two teams.  LRT set up the cups for each team like bowling pins.  Each player was given to chances per turn to knock over as many cups as possible.  Each cup knocked down counted as one point.  The first team to reach 80 points won the game.  Education: Wellness, Building control surveyorDischarge Planning.   Education Outcome: Acknowledges education/In group clarification offered/Needs additional education.   Clinical Observations/Feedback: Pt gave minimal participation.  Pt was able to focus and complete two turns before leaving group.     Caroll RancherMarjette Emre Stock, LRT/CTRS         Caroll RancherLindsay, Soua Caltagirone A 04/15/2016 11:54 AM

## 2016-04-15 NOTE — Progress Notes (Signed)
D: Pt continues to be loud, intrusive and delusional. Pt complained of moderate bilateral foot pain. Pt however denies depression, anxiety, SI, HI or AVH; state; "I don't want that nasty medicine anymore." Pt observed interacting with peers in the dayroom. Pt remained nonviolent.  A: Medications offered as prescribed. All patient's questions and concerns addressed. Support, encouragement, and safe environment provided. 15-minute safety checks continue.   R: Pt was med compliant.  Pt attended wrap-up group. Safety checks continue

## 2016-04-15 NOTE — [facility] Group Notes (Signed)
[facility] LCSW Group Therapy  04/15/2016 3:50 PM   Type of Therapy:  Group Therapy  Participation Level:  Active  Participation Quality:  Attentive  Affect:  Appropriate  Cognitive:  Appropriate  Insight:  Improving  Engagement in Therapy:  Engaged  Modes of Intervention:  Clarification, Education, Exploration and Socialization  Summary of Progress/Problems: Today's group focused on relapse prevention.  We defined the term, and then brainstormed on ways to prevent relapse. Invited.  In bed asleep.  [name], Vikram Tillett B 04/15/2016 , 3:50 PM

## 2016-04-15 NOTE — Progress Notes (Addendum)
[facility] MD Progress Note  04/15/2016 12:18 PM Jeralynn Vaquera  MRN:  [id number]   Subjective: Patient states " I want to get out of here . [facility] can [facility], I want to manage my own money.'     Objective:Patient seen and chart reviewed.Discussed patient with treatment team.  Pt today continues to have periods when she is pressured, agitated , however its mostly about her being in the hospital. Pt continues to decline wanting to get a payee , states she can manage it herself.  Pt also believes she can return to her apartment. CSW and Probation officer met with pt - discussed disposition plan. Discussed that her daughter wants to pay her back rent to avoid late fee , to the land lord . Pt however states she wants to do it herself. Pt agrees to TCT and is willing to get a PMD to manage her medical problems. Pt is on Lithium - Li level level will be ordered again tomorrow, but if she gets discharged soon , will need to be rechecked since she had dose change yesterday.Next level on Sunday - 04/19/16.   Principal Problem: Schizoaffective disorder, bipolar type ([place]) Diagnosis:   Patient Active Problem List   Diagnosis Date Noted  . Hyperammonemia ([place]) [E72.20] 04/13/2016  . Agitation [R45.1]   . Change in behavior [R46.89]   . Pedal edema [R60.0] 04/06/2016  . Electrolyte imbalance [E87.8] 03/27/2016  . Prolonged Q-T interval on ECG [R94.31] 03/13/2016  . Schizoaffective disorder, bipolar type ([place]) [F25.0] 03/12/2016  . Diabetes mellitus ([place]) [E11.9] 03/12/2016   Total Time spent with patient: 25 minutes  Past Psychiatric History: Please see H&P.   Past Medical History:  Past Medical History:  Diagnosis Date  . Diabetes mellitus without complication [facility])     Past Surgical History:  Procedure Laterality Date  . umbical hernia repair     Family History: Please see H&P.  Family Psychiatric  History: Please see H&P.  Social History: Please see H&P.  History  Alcohol Use No      History  Drug Use No    Social History   Social History  . Marital status: Single    Spouse name: N/A  . Number of children: N/A  . Years of education: N/A   Social History Main Topics  . Smoking status: Current Every Day Smoker    Packs/day: 0.25  . Smokeless tobacco: Never Used  . Alcohol use No  . Drug use: No  . Sexual activity: Not Asked   Other Topics Concern  . None   Social History Narrative  . None   Additional Social History:   Sleep: Fair   Appetite:  Fair  Current Medications: Current Facility-Administered Medications  Medication Dose Route Frequency Provider Last Rate Last Dose  . acetaminophen (TYLENOL) tablet 650 mg  650 mg Oral Q6H PRN Laverle Hobby, PA-C   650 mg at 04/14/16 0836  . alum & mag hydroxide-simeth (MAALOX/MYLANTA) 200-200-20 MG/5ML suspension 30 mL  30 mL Oral Q4H PRN Laverle Hobby, PA-C   30 mL at 04/11/16 2047  . benztropine (COGENTIN) tablet 1 mg  1 mg Oral QHS Ursula Alert, MD   1 mg at 04/14/16 2125  . diphenhydrAMINE (BENADRYL) injection 50 mg  50 mg Intramuscular Once Ethelene Hal, NP      . furosemide (LASIX) tablet 40 mg  40 mg Oral Daily Reyne Dumas, MD   40 mg at 04/15/16 2703  . gabapentin (NEURONTIN) capsule  600 mg  600 mg Oral QHS Ursula Alert, MD   600 mg at 04/14/16 2125  . guaiFENesin (ROBITUSSIN) 100 MG/5ML solution 100 mg  5 mL Oral Q4H PRN Sharlotte Baka, MD      . insulin aspart (novoLOG) injection 0-15 Units  0-15 Units Subcutaneous TID WC Ursula Alert, MD   2 Units at 04/14/16 1739  . insulin aspart (novoLOG) injection 0-5 Units  0-5 Units Subcutaneous QHS Oaklen Thiam, MD      . lactulose (CHRONULAC) 10 GM/15ML solution 30 g  30 g Oral QID Kiaan Overholser, MD      . lithium carbonate capsule 300 mg  300 mg Oral TID WC Denzell Colasanti, MD   300 mg at 04/15/16 1143  . LORazepam (ATIVAN) tablet 1 mg  1 mg Oral Q6H PRN Ursula Alert, MD   1 mg at 04/15/16 1142   Or  . LORazepam (ATIVAN) injection 1  mg  1 mg Intramuscular Q6H PRN Ursula Alert, MD      . magnesium hydroxide (MILK OF MAGNESIA) suspension 30 mL  30 mL Oral Daily PRN Laverle Hobby, PA-C      . magnesium oxide (MAG-OX) tablet 400 mg  400 mg Oral BID Reyne Dumas, MD   400 mg at 04/15/16 0821  . nicotine polacrilex (NICORETTE) gum 2 mg  2 mg Oral PRN Ursula Alert, MD      . OLANZapine zydis (ZYPREXA) disintegrating tablet 20 mg  20 mg Oral QHS Ursula Alert, MD   20 mg at 04/14/16 2125  . OLANZapine zydis (ZYPREXA) disintegrating tablet 5 mg  5 mg Oral Daily Ursula Alert, MD   5 mg at 04/15/16 0347  . Oxcarbazepine (TRILEPTAL) tablet 300 mg  300 mg Oral BID Ursula Alert, MD   300 mg at 04/15/16 0820  . potassium chloride SA (K-DUR,KLOR-CON) CR tablet 40 mEq  40 mEq Oral Daily Belkys A Regalado, MD   40 mEq at 04/15/16 0820  . propranolol (INDERAL) tablet 10 mg  10 mg Oral TID Ursula Alert, MD   10 mg at 04/15/16 1149  . temazepam (RESTORIL) capsule 30 mg  30 mg Oral QHS Ursula Alert, MD   30 mg at 04/14/16 2125  . traZODone (DESYREL) tablet 25 mg  25 mg Oral QHS Ursula Alert, MD   25 mg at 04/14/16 2125   Lab Results:  Results for orders placed or performed during the hospital encounter of 03/11/16 (from the past 48 hour(s))  Glucose, capillary     Status: Abnormal   Collection Time: 04/13/16  5:21 PM  Result Value Ref Range   Glucose-Capillary 117 (H) 65 - 99 mg/dL   Comment 1 Notify RN    Comment 2 Document in Chart   Glucose, capillary     Status: Abnormal   Collection Time: 04/13/16  8:45 PM  Result Value Ref Range   Glucose-Capillary 110 (H) 65 - 99 mg/dL  Glucose, capillary     Status: None   Collection Time: 04/14/16  6:20 AM  Result Value Ref Range   Glucose-Capillary 78 65 - 99 mg/dL  Lithium level     Status: Abnormal   Collection Time: 04/14/16  6:34 AM  Result Value Ref Range   Lithium Lvl 0.32 (L) 0.60 - 1.20 mmol/L    Comment: Performed at [facility], [place] [address] [place], [place] [address]  Ammonia     Status: Abnormal   Collection Time: 04/14/16  6:34 AM  Result  Value Ref Range   Ammonia 76 (H) 9 - 35 umol/L    Comment: Performed at [facility], [place] [address] [place], [place] [address]  Glucose, capillary     Status: Abnormal   Collection Time: 04/14/16 12:14 PM  Result Value Ref Range   Glucose-Capillary 105 (H) 65 - 99 mg/dL   Comment 1 Notify RN   Glucose, capillary     Status: Abnormal   Collection Time: 04/14/16  5:02 PM  Result Value Ref Range   Glucose-Capillary 132 (H) 65 - 99 mg/dL   Comment 1 Notify RN   Glucose, capillary     Status: Abnormal   Collection Time: 04/14/16  8:00 PM  Result Value Ref Range   Glucose-Capillary 122 (H) 65 - 99 mg/dL  Ammonia     Status: None   Collection Time: 04/15/16  6:26 AM  Result Value Ref Range   Ammonia 29 9 - 35 umol/L    Comment: Performed at [facility], [place] [address], [place], [place] [address]  Glucose, capillary     Status: None   Collection Time: 04/15/16  6:34 AM  Result Value Ref Range   Glucose-Capillary 94 65 - 99 mg/dL  Glucose, capillary     Status: None   Collection Time: 04/15/16 11:49 AM  Result Value Ref Range   Glucose-Capillary 94 65 - 99 mg/dL   Comment 1 Notify RN    Comment 2 Document in Chart     Blood Alcohol level:  Lab Results  Component Value Date   ETH <5 03/11/2016   ETH <5 98/33/8250   Metabolic Disorder Labs: Lab Results  Component Value Date   HGBA1C 5.2 03/13/2016   MPG 103 03/13/2016   Lab Results  Component Value Date   PROLACTIN 27.9 (H) 03/13/2016   Lab Results  Component Value Date   CHOL 150 03/13/2016   TRIG 69 03/13/2016   HDL 43 03/13/2016   CHOLHDL 3.5 03/13/2016   VLDL 14 03/13/2016   LDLCALC 93 03/13/2016   Physical Findings: AIMS: Facial and Oral Movements Muscles of Facial Expression: None, normal Lips and Perioral Area: None, normal Jaw: None, normal Tongue: None,  normal,Extremity Movements Upper (arms, wrists, hands, fingers): None, normal Lower (legs, knees, ankles, toes): None, normal, Trunk Movements Neck, shoulders, hips: None, normal, Overall Severity Severity of abnormal movements (highest score from questions above): None, normal Incapacitation due to abnormal movements: None, normal Patient's awareness of abnormal movements (rate only patient's report): No Awareness, Dental Status Current problems with teeth and/or dentures?: No Does patient usually wear dentures?: No  CIWA:  CIWA-Ar Total: 1 COWS:  COWS Total Score: 2  Musculoskeletal: Strength & Muscle Tone: within normal limits Gait & Station: normal Patient leans: N/A  Psychiatric Specialty Exam: Physical Exam  Nursing note and vitals reviewed.   Review of Systems  Psychiatric/Behavioral: The patient is nervous/anxious.   All other systems reviewed and are negative.   Blood pressure (!) 155/94, pulse 86, temperature 98.8 F (37.1 C), resp. rate 18, height '5\' 5"'$  (1.651 m), weight 92.5 kg (204 lb), SpO2 99 %.Body mass index is 33.95 kg/m.  General Appearance: Fairly Groomed  Eye Contact:  Fair  Speech:  Pressured on and off   Volume:  varies   Mood:  labile at times  Affect:  is able to stay calm , but is anxious on and off  Thought Process:  Linear and Descriptions of Associations: Circumstantial   Orientation:  Full (  Time, Place, and Person)  Thought Content:  Delusions and Rumination chronic , fixed  Suicidal Thoughts:  No  Homicidal Thoughts:  No  Memory:  Immediate;   Fair Recent;   Fair Remote;   Poor  Judgement:  Fair  Insight:  Shallow  Psychomotor Activity:  Normal  Concentration:  Concentration: Fair and Attention Span: Fair  Recall:  [employer] of Knowledge:  Fair  Language:  Fair  Akathisia:  No  Handed:  Right  AIMS (if indicated):     Assets:  Desire for Improvement Talents/Skills  ADL's:  Intact  Cognition:  WNL  Sleep:  Number of Hours: 6       Treatment plan: Patient today seen as pressured at times , seems to be her baseline , is labile at times mostly about her discharge . CSW is working with her family about disposition plan. Will continue to work on referral to TCT, PMD , [facility] appointment as well as Day program.    Daily contact with patient to assess and evaluate symptoms and progress in treatment and Medication management   For schizoaffective do: reviewed as below Will continue Zyprexa 20 mg po bedtime. Will continue Zyprexa 5 mg daily.  For EPS: Will continue Cogentin 1 mg po qhs.   For agitation/Restlessness: Will continue Neurontin 600 mg po qhs.  For mood lability: Will increase Trilpetal to 300 mg po bid. Will continue Li 300 mg po tid for mood sx. Li level - 04/14/16- subtherapeutic. Results for MARQUESA, RATH (MRN [id number]) as of 04/14/2016 15:39  Ref. Range 04/14/2016 06:34  Lithium Latest Ref Range: 0.60 - 1.20 mmol/L 0.32 (L)   Next Li level on Sunday - 04/19/2016. Will check Li level tomorrow , along with BMP - since her Mg, K + needs to be monitored.  Discontinued Depakote 2/2 hyperammonemia.    For insomnia: Will continue Restoril 30 mg po qhs. Will continue Trazodone 25 mg po qhs (will be cautious due to h/o qtc prolongation). Patient failed Ambien, Lunesta , doxepin , elavil  Trials)   For restlessness/akathisia:reviewed as below Will continue Propranolol to 10 mg po tid.  For pedal edema: Lasix 40 mg po daily. As per hospitalist consult.  For hyperammonemia:( resolved)  Will discontinue Lactulose. Ammonia level today - WNL - Results for BONNY, VANLEEUWEN (MRN [id number]) as of 04/15/2016 12:17  Ref. Range 04/15/2016 06:26  Ammonia Latest Ref Range: 9 - 35 umol/L 29     H/O prolonged QTC on EKG and she is also on Lithium: Repeat EKG - shows qtc - 456, nonspecific T wave abnormality/NSR.   DM: Will continue current medication regimen.  Home health  consult placed- pending.  CSW will work on referral to day program / TCT.  Pt to be discharged home if she continues to be stable and continues to improve.CSW will continue to work on setting up aftercare appointments and support services.    Chava Dulac, MD 04/15/2016, 12:18 PM Patient ID: Rockney Ghee, female   DOB: 1970-05-30, 46 y.o.   MRN: [id number]

## 2016-04-16 DIAGNOSIS — R451 Restlessness and agitation: Secondary | ICD-10-CM

## 2016-04-16 DIAGNOSIS — R600 Localized edema: Secondary | ICD-10-CM

## 2016-04-16 DIAGNOSIS — E7220 Disorder of urea cycle metabolism, unspecified: Secondary | ICD-10-CM

## 2016-04-16 LAB — BASIC METABOLIC PANEL
Anion gap: 6 (ref 5–15)
BUN: 19 mg/dL (ref 6–20)
CHLORIDE: 102 mmol/L (ref 101–111)
CO2: 30 mmol/L (ref 22–32)
CREATININE: 0.89 mg/dL (ref 0.44–1.00)
Calcium: 9.1 mg/dL (ref 8.9–10.3)
GFR calc Af Amer: >60 mL/min (ref >60)
Glucose, Bld: 110 mg/dL — ABNORMAL HIGH (ref 65–99)
Potassium: 5.1 mmol/L (ref 3.5–5.1)
SODIUM: 138 mmol/L (ref 135–145)

## 2016-04-16 LAB — MAGNESIUM: MAGNESIUM: 2 mg/dL (ref 1.7–2.4)

## 2016-04-16 LAB — GLUCOSE, CAPILLARY
GLUCOSE-CAPILLARY: 103 mg/dL — AB (ref 65–99)
Glucose-Capillary: 93 mg/dL (ref 65–99)

## 2016-04-16 LAB — LITHIUM LEVEL: LITHIUM LVL: 0.52 mmol/L — AB (ref 0.60–1.20)

## 2016-04-16 MED ORDER — PROPRANOLOL HCL 10 MG PO TABS: 10.0000 mg | ORAL_TABLET | Freq: Three times a day (TID) | ORAL | 0 refills | Status: AC

## 2016-04-16 MED ORDER — OXCARBAZEPINE 300 MG PO TABS: 300.0000 mg | ORAL_TABLET | Freq: Two times a day (BID) | ORAL | 0 refills | Status: AC

## 2016-04-16 MED ORDER — POTASSIUM CHLORIDE CRYS ER 20 MEQ PO TBCR: 40.0000 meq | EXTENDED_RELEASE_TABLET | Freq: Every day | ORAL | 0 refills | Status: AC

## 2016-04-16 MED ORDER — OLANZAPINE 5 MG PO TBDP: 5.0000 mg | ORAL_TABLET | Freq: Every day | ORAL | 0 refills | Status: AC

## 2016-04-16 MED ORDER — LITHIUM CARBONATE 300 MG PO CAPS: 300.0000 mg | ORAL_CAPSULE | Freq: Three times a day (TID) | ORAL | 0 refills | Status: AC

## 2016-04-16 MED ORDER — OLANZAPINE 20 MG PO TBDP: 20.0000 mg | ORAL_TABLET | Freq: Every day | ORAL | 0 refills | Status: AC

## 2016-04-16 MED ORDER — BENZTROPINE MESYLATE 1 MG PO TABS: 1.0000 mg | ORAL_TABLET | Freq: Every day | ORAL | 0 refills | Status: AC

## 2016-04-16 MED ORDER — MAGNESIUM OXIDE 400 (241.3 MG) MG PO TABS: 400.0000 mg | ORAL_TABLET | Freq: Two times a day (BID) | ORAL | 0 refills | Status: AC

## 2016-04-16 MED ORDER — NICOTINE POLACRILEX 2 MG MT GUM: 2.0000 mg | CHEWING_GUM | OROMUCOSAL | 0 refills | Status: AC | PRN

## 2016-04-16 MED ORDER — FUROSEMIDE 40 MG PO TABS: 40.0000 mg | ORAL_TABLET | Freq: Every day | ORAL | 0 refills | Status: AC

## 2016-04-16 MED ORDER — GABAPENTIN 300 MG PO CAPS: 600.0000 mg | ORAL_CAPSULE | Freq: Every day | ORAL | 0 refills | Status: AC

## 2016-04-16 MED ORDER — TEMAZEPAM 30 MG PO CAPS: 30.0000 mg | ORAL_CAPSULE | Freq: Every day | ORAL | 0 refills | Status: AC

## 2016-04-16 NOTE — Discharge Summary (Signed)
Physician Discharge Summary Note  Patient:  Catherine Bender is an 46 y.o., female MRN:  [id number] DOB:  04-24-1970 Patient phone:  [phone] (home)  Patient address:   [address] [place] [address],  Total Time spent with patient: 30 minutes  Date of Admission:  03/11/2016 Date of Discharge: 04/16/2016  Reason for Admission:  Confused, destructive to property   Principal Problem: Schizoaffective disorder, bipolar type [facility]) Discharge Diagnoses: Patient Active Problem List   Diagnosis Date Noted  . Hyperammonemia ([facility]) [E72.20] 04/13/2016  . Agitation [R45.1]   . Change in behavior [R46.89]   . Pedal edema [R60.0] 04/06/2016  . Electrolyte imbalance [E87.8] 03/27/2016  . Prolonged Q-T interval on ECG [R94.31] 03/13/2016  . Schizoaffective disorder, bipolar type ([facility]) [F25.0] 03/12/2016  . Diabetes mellitus ([facility]) [E11.9] 03/12/2016    Past Psychiatric History:  See HPI  Past Medical History:  Past Medical History:  Diagnosis Date  . Diabetes mellitus without complication [facility])     Past Surgical History:  Procedure Laterality Date  . umbical hernia repair     Family History: History reviewed. No pertinent family history. Family Psychiatric  History: see HPI Social History:  History  Alcohol Use No     History  Drug Use No    Social History   Social History  . Marital status: Single    Spouse name: N/A  . Number of children: N/A  . Years of education: N/A   Social History Main Topics  . Smoking status: Current Every Day Smoker    Packs/day: 0.25  . Smokeless tobacco: Never Used  . Alcohol use No  . Drug use: No  . Sexual activity: Not Asked   Other Topics Concern  . None   Social History Narrative  . None    Hospital Course:  Catherine Bender, 46 y.o.femalewith history of Schizophrenia and Bipolar disorder.  She was reported confused and destructive to property.   Catherine Bender was admitted for Schizoaffective disorder, bipolar  type (HCC) and crisis management.  Patient was treated with medications with their indications listed below in detail under Medication List.  Medical problems were identified and treated as needed.  Home medications were restarted as appropriate.   Catherine Bender required close observation/sitter monitoring.  Patient was loud and intrusive to fellow patients and to staff.  Slow but measurable improvement, Emotional and mental status was monitored by nursing staff and medications adjusted accordingly per symptoms.  Patient had been compliant on medications and denied side effects.  Support and encouragement was provided.            Catherine Bender was evaluated by the treatment team for stability and plans for continued recovery upon discharge.  Patient was offered further treatment options upon discharge including Residential, Intensive Outpatient and Outpatient treatment. Patient will follow up with agency listed below for medication management and counseling.  Encouraged patient to maintain satisfactory support network and home environment.  Advised to adhere to medication compliance and outpatient treatment follow up.  Prescriptions provided.       Catherine Bender motivation was an integral factor for scheduling further treatment.  Employment, transportation, bed availability, health status, family support, and any pending legal issues were also considered during patient's hospital stay.  Upon completion of this admission the patient was both mentally and medically stable for discharge denying suicidal/homicidal ideation, auditory/visual/tactile hallucinations, delusional thoughts and paranoia.      Physical Findings: AIMS: Facial and Oral Movements Muscles of Facial Expression: None,  normal Lips and Perioral Area: None, normal Jaw: None, normal Tongue: None, normal,Extremity Movements Upper (arms, wrists, hands, fingers): None, normal Lower (legs, knees, ankles, toes): None, normal, Trunk Movements Neck,  shoulders, hips: None, normal, Overall Severity Severity of abnormal movements (highest score from questions above): None, normal Incapacitation due to abnormal movements: None, normal Patient's awareness of abnormal movements (rate only patient's report): No Awareness, Dental Status Current problems with teeth and/or dentures?: No Does patient usually wear dentures?: No  CIWA:  CIWA-Ar Total: 1 COWS:  COWS Total Score: 2  Musculoskeletal: Strength & Muscle Tone: within normal limits Gait & Station: normal Patient leans: N/A  Psychiatric Specialty Exam:  See HPI Physical Exam  Nursing note and vitals reviewed.   ROS  Blood pressure (!) 160/66, pulse 86, temperature 98.4 F (36.9 C), temperature source Oral, resp. rate 20, height 5\' 5"  (1.651 m), weight 92.5 kg (204 lb), SpO2 99 %.Body mass index is 33.95 kg/m.    Have you used any form of tobacco in the last 30 days? (Cigarettes, Smokeless Tobacco, Cigars, and/or Pipes): Yes  Has this patient used any form of tobacco in the last 30 days? (Cigarettes, Smokeless Tobacco, Cigars, and/or Pipes) Yes, RX given to patient   Blood Alcohol level:  Lab Results  Component Value Date   [facility] <5 03/11/2016   ETH <5 01/05/2016    Metabolic Disorder Labs:  Lab Results  Component Value Date   HGBA1C 5.2 03/13/2016   MPG 103 03/13/2016   Lab Results  Component Value Date   PROLACTIN 27.9 (H) 03/13/2016   Lab Results  Component Value Date   CHOL 150 03/13/2016   TRIG 69 03/13/2016   HDL 43 03/13/2016   CHOLHDL 3.5 03/13/2016   VLDL 14 03/13/2016   LDLCALC 93 03/13/2016    See Psychiatric Specialty Exam and Suicide Risk Assessment completed by Attending Physician prior to discharge.  Discharge destination:  Home  Is patient on multiple antipsychotic therapies at discharge:  No   Has Patient had three or more failed trials of antipsychotic monotherapy by history:  No  Recommended Plan for Multiple Antipsychotic  Therapies: NA   Allergies as of 04/16/2016      Reactions   Morphine And Related Other (See Comments)   unknown      Medication List    STOP taking these medications   LITHIUM PO     TAKE these medications     Indication  benztropine 1 MG tablet Commonly known as:  COGENTIN Take 1 tablet (1 mg total) by mouth at bedtime.  Indication:  Extrapyramidal Reaction caused by Medications   furosemide 40 MG tablet Commonly known as:  LASIX Take 1 tablet (40 mg total) by mouth daily. Start taking on:  04/17/2016  Indication:  High Blood Pressure Disorder   gabapentin 300 MG capsule Commonly known as:  NEURONTIN Take 2 capsules (600 mg total) by mouth at bedtime.  Indication:  Agitation   lithium carbonate 300 MG capsule Take 1 capsule (300 mg total) by mouth 3 (three) times daily with meals.  Indication:  mood stabilization   magnesium oxide 400 (241.3 Mg) MG tablet Commonly known as:  MAG-OX Take 1 tablet (400 mg total) by mouth 2 (two) times daily.  Indication:  supplementation   nicotine polacrilex 2 MG gum Commonly known as:  NICORETTE Take 1 each (2 mg total) by mouth as needed for smoking cessation.  Indication:  Nicotine Addiction   OLANZapine zydis 20 MG disintegrating tablet  Commonly known as:  ZYPREXA Take 1 tablet (20 mg total) by mouth at bedtime.  Indication:  mood stabilization   OLANZapine zydis 5 MG disintegrating tablet Commonly known as:  ZYPREXA Take 1 tablet (5 mg total) by mouth daily. Start taking on:  04/17/2016  Indication:  mood stabilization   Oxcarbazepine 300 MG tablet Commonly known as:  TRILEPTAL Take 1 tablet (300 mg total) by mouth 2 (two) times daily.  Indication:  mood stabilization   potassium chloride SA 20 MEQ tablet Commonly known as:  K-DUR,KLOR-CON Take 2 tablets (40 mEq total) by mouth daily. Start taking on:  04/17/2016  Indication:  Low Amount of Potassium in the Blood   propranolol 10 MG tablet Commonly known as:   INDERAL Take 1 tablet (10 mg total) by mouth 3 (three) times daily.  Indication:  High Blood Pressure Disorder   temazepam 30 MG capsule Commonly known as:  RESTORIL Take 1 capsule (30 mg total) by mouth at bedtime.  Indication:  Trouble Sleeping      Follow-up Information    [facility]. Go on 04/20/2016.   Why:  PLEASE ARRIVE ON MONDAY MARCH 12TH FOR A 1:30PM APPT  Contact information: [address] [place] [address] [phone]       [facility].   [facility]  [facility] Contact information: [address] [place] [address] [phone]           Follow-up recommendations:  Activity:  as tol Diet:  as tol  Comments:  1.  Take all your medications as prescribed.   2.  Report any adverse side effects to outpatient provider. 3.  Patient instructed to not use alcohol or illegal drugs while on prescription medicines. 4.  In the event of worsening symptoms, instructed patient to call 911, the crisis hotline or go to nearest emergency room for evaluation of symptoms.  Signed: Lindwood QuaSheila May Keghan Mcfarren, NP [facility] 04/16/2016, 10:42 AM

## 2016-04-16 NOTE — Progress Notes (Signed)
  [facility] Dis[facility] :  Will you be returning to the same living situation after discharge:  Yes,  home At discharge, do you have transportation home?: Yes,  Catherine Bender with TCT Do you have the ability to pay for your medications: Yes,  MCD  Release of information consent forms completed and in the chart;  Patient's signature needed at discharge.  Patient to Follow up at: Follow-up Information    [facility]. Go on 04/20/2016.   Why:  PLEASE ARRIVE ON MONDAY MARCH 12TH FOR A 1:30PM APPT  Contact information: [address] [place] [address] [phone]       [facility].   Specialty:  Behavioral Health Why:  Catherine Bender is your contact for [facility].  He will set you up with a hospital follow up appointment, and make sure you have transportation there.  You can reach him at [phone].  Follow up with Catherine Bender starting on Monday. Contact information: [address] [place] [address] [phone]           Next level of care provider has access to [facility] Link:no  Safety Planning and Suicide Prevention discussed: Yes,  yes  Have you used any form of tobacco in the last 30 days? (Cigarettes, Smokeless Tobacco, Cigars, and/or Pipes): Yes  Has patient been referred to the Quitline?: Patient refused referral  Patient has been referred for addiction treatment: N/A  Catherine RogueRodney Bender Catherine Bender 04/16/2016, 10:54 AM

## 2016-04-16 NOTE — Progress Notes (Signed)
Recreation Therapy Notes  Date: 04/16/16 Time: 0945 Location: [address] Dayroom   Group Topic: Communication, Team Building, Problem Solving  Goal Area(s) Addresses:  Patient will effectively work with peer towards shared goal.  Patient will identify skills used to make activity successful.  Patient will identify how skills used during activity can be used to reach post d/c goals.   Behavioral Response: None  Intervention: STEM Activity  Activity: Landing Pad. In teams patients were given 12 plastic drinking straws and a length of masking tape. Using the materials provided patients were asked to build a landing pad to catch a golf ball dropped from approximately 6 feet in the air.   Education: Pharmacist, communityocial Skills, Discharge Planning   Education Outcome: Acknowledges education/In group clarification offered/Needs additional education.   Clinical Observations/Feedback: Pt was late, she sat for a while.  Pt left early with doctor and did not return.   Caroll RancherMarjette Sylina Henion, LRT/CTRS         Caroll RancherLindsay, Haytham Maher A 04/16/2016 11:29 AM

## 2016-04-16 NOTE — Plan of Care (Signed)
Problem: [facility] Participation in Recreation Therapeutic Interventions Goal: STG-Patient will attend/participate in Rec Therapy Group Ses STG-The Patient will attend and participate in Recreation Therapy Group Sessions  Outcome: Completed/Met Date Met: 04/16/16 Pt attended and participated in wellness, leisure education, coping skills, stress management, anger management and self-esteem recreation therapy sessions.  Victorino Sparrow, LRT/CTRS

## 2016-04-16 NOTE — BHH Suicide Risk Assessment (Signed)
[facility] Discharge Suicide Risk Assessment   Principal Problem: Schizoaffective disorder, bipolar type [facility]) Discharge Diagnoses:  Patient Active Problem List   Diagnosis Date Noted  . Hyperammonemia (HCC) [E72.20] 04/13/2016  . Agitation [R45.1]   . Change in behavior [R46.89]   . Pedal edema [R60.0] 04/06/2016  . Electrolyte imbalance [E87.8] 03/27/2016  . Prolonged Q-T interval on ECG [R94.31] 03/13/2016  . Schizoaffective disorder, bipolar type (HCC) [F25.0] 03/12/2016  . Diabetes mellitus (HCC) [E11.9] 03/12/2016    Total Time spent with patient: 30 minutes  Musculoskeletal: Strength & Muscle Tone: within normal limits Gait & Station: normal Patient leans: N/A  Psychiatric Specialty Exam: ROS no headache, no chest pain, no nausea, no vomiting , no fever, no chills   Blood pressure (!) 160/66, pulse 86, temperature 98.4 F (36.9 C), temperature source Oral, resp. rate 20, height 5\' 5"  (1.651 m), weight 92.5 kg (204 lb), SpO2 99 %.Body mass index is 33.95 kg/m.  General Appearance: improved grooming   Eye Contact::  Good  Speech:  improved, less pressured 409  Volume:  Normal  Mood:  " good "  Affect:  remains expansive, but not irritable   Thought Process:  Better organized but still tangential   Orientation:  Other:  Oriented x 3, fully alert and attentive   Thought Content:  denies hallucinations, no clear delusions expressed, does not currently present internally preoccupied   Suicidal Thoughts:  No- denies any suicidal ideations , no self injurious ideations   Homicidal Thoughts:  No- denies any homicidal or violent ideations towards anyone   Memory:  recent and remote fair   Judgement:  Fair  Insight:  Fair  Psychomotor Activity:  Normal- not restless or agitated at this time   Concentration:  Fair  Recall:  Good  Fund of Knowledge:Fair  Language: Good  Akathisia:  Negative  Handed:  Right  AIMS (if indicated):   no abnormal or involuntary movements noted or  reported   Assets:  Desire for Improvement Resilience  Sleep:  Number of Hours: 6.25  Cognition: WNL  ADL's:  Improved compared to admission    Mental Status Per Nursing Assessment::   On Admission:     Demographic Factors:  46 year old female   Loss Factors: Chronic mental illness, on disability   Historical Factors: History of chronic mental illness, in the past has been diagnosed with Schizophrenia, Schizoaffective Disorder  Risk Reduction Factors:   Sense of responsibility to family and resilience   Continued Clinical Symptoms:   I have reviewed case with Rod, CSW, with Dr. Elna Breslow , attending psychiatrist, with Nursing staff . As reported by Dr. Elna Breslow , patient is scheduled for discharge today. She has ongoing , chronic psychiatric symptoms,but has improved partially and at this time is considered to be at baseline +  improved enough for discharge . She has been less labile , more easily redirectable, less intrusive . At this time patient is alert, attentive, fully alert x 3 ,  pleasant. Not overtly agitated or disruptive. Speech remains pressured at times, affect tends to be expansive, jovial, but not irritable or angry. Thought process becomes tangential with open ended questions, no suicidal or homicidal ideations, denies hallucinations. Denies medication side effects. Labs reviewed - Li level 0.52- as recommended by Dr. Elna Breslow will not increase Lithium dose further as dose was just recently increased .   Cognitive Features That Contribute To Risk:  No gross cognitive deficits noted upon discharge. Is alert , attentive, and oriented  x 3   Suicide Risk:  Mild:  Suicidal ideation of limited frequency, intensity, duration, and specificity.  There are no identifiable plans, no associated intent, mild dysphoria and related symptoms, good self-control (both objective and subjective assessment), few other risk factors, and identifiable protective factors, including available and  accessible social support.  Follow-up Information    [facility]. Go on 04/20/2016.   Why:  PLEASE ARRIVE ON MONDAY MARCH 12TH FOR A 1:30PM APPT  Contact information: [address] [place], [place] [address] [phone]       [facility].   Specialty[facility]  [facility] Contact information: [address] [place] [address] [phone]           Plan Of Care/Follow-up recommendations:  Activity:  as tolerated  Diet:  Heart healthy, Diabetic Diet  Tests:  NA Other:  See below  Patient is leaving unit in good spirits  Patient plans to return to her apartment Follow up as above   Craige CottaFernando A Cobos, MD 04/16/2016, 10:02 AM

## 2016-04-16 NOTE — Progress Notes (Signed)
Pt discharged home. Pt was stable and appreciative at that time, pt also stated that she had her with her at the time of admission, but on belonging sheet it states that she nothing at the admission time. All papers and prescriptions were given and valuables returned. Verbal understanding expressed. Denies SI/HI and A/VH. Pt given opportunity to express concerns and ask questions.

## 2016-04-18 NOTE — Progress Notes (Signed)
D: Pt was paranoid, delusional and tangential; needs constant redirecting. Pt minimizes issues concerning his diabetes and continually wants to eat the wrong foods; state, "I am Jewish, my blood is better than most; high blood sugar will not affect me."   Pt is flat and withdrawn to self even while in the dayroom. Pt however; denies depression, anxiety, SI, HI or AVH. Pt remained nonviolent.   A: Medications offered as prescribed. All patient's questions and concerns addressed. Support, encouragement, and safe environment provided. 15-minute safety checks continue.   R: Pt was med compliant. Safety checks continue.

## 2016-11-05 ENCOUNTER — Emergency Department (HOSPITAL_COMMUNITY)
Admission: EM | Admit: 2016-11-05 | Discharge: 2016-11-06 | Disposition: A | Discharge status: Other Acute Inpatient Hospital | Payer: Medicaid Other | Attending: Emergency Medicine | Admitting: Emergency Medicine

## 2016-11-05 ENCOUNTER — Encounter (HOSPITAL_COMMUNITY): Payer: Self-pay | Admitting: Emergency Medicine

## 2016-11-05 DIAGNOSIS — F250 Schizoaffective disorder, bipolar type: Secondary | ICD-10-CM | POA: Diagnosis present

## 2016-11-05 DIAGNOSIS — F309 Manic episode, unspecified: Secondary | ICD-10-CM | POA: Diagnosis not present

## 2016-11-05 DIAGNOSIS — Z79899 Other long term (current) drug therapy: Secondary | ICD-10-CM | POA: Insufficient documentation

## 2016-11-05 DIAGNOSIS — F3010 Manic episode without psychotic symptoms, unspecified: Secondary | ICD-10-CM

## 2016-11-05 DIAGNOSIS — F17210 Nicotine dependence, cigarettes, uncomplicated: Secondary | ICD-10-CM | POA: Diagnosis not present

## 2016-11-05 DIAGNOSIS — F22 Delusional disorders: Secondary | ICD-10-CM | POA: Diagnosis present

## 2016-11-05 DIAGNOSIS — F419 Anxiety disorder, unspecified: Secondary | ICD-10-CM | POA: Diagnosis not present

## 2016-11-05 LAB — COMPREHENSIVE METABOLIC PANEL
ALK PHOS: 78 U/L (ref 38–126)
ALT: 14 U/L (ref 14–54)
AST: 19 U/L (ref 15–41)
Albumin: 3.7 g/dL (ref 3.5–5.0)
Anion gap: 9 (ref 5–15)
BILIRUBIN TOTAL: 0.6 mg/dL (ref 0.3–1.2)
BUN: 13 mg/dL (ref 6–20)
CALCIUM: 8.9 mg/dL (ref 8.9–10.3)
CHLORIDE: 105 mmol/L (ref 101–111)
CO2: 24 mmol/L (ref 22–32)
CREATININE: 0.88 mg/dL (ref 0.44–1.00)
Glucose, Bld: 145 mg/dL — ABNORMAL HIGH (ref 65–99)
Potassium: 3.5 mmol/L (ref 3.5–5.1)
Sodium: 138 mmol/L (ref 135–145)
TOTAL PROTEIN: 7.7 g/dL (ref 6.5–8.1)

## 2016-11-05 LAB — CBC
HCT: 36.3 % (ref 36.0–46.0)
Hemoglobin: 12.3 g/dL (ref 12.0–15.0)
MCH: 29.2 pg (ref 26.0–34.0)
MCHC: 33.9 g/dL (ref 30.0–36.0)
MCV: 86.2 fL (ref 78.0–100.0)
Platelets: 375 10*3/uL (ref 150–400)
RBC: 4.21 MIL/uL (ref 3.87–5.11)
RDW: 14 % (ref 11.5–15.5)
WBC: 15.2 10*3/uL — ABNORMAL HIGH (ref 4.0–10.5)

## 2016-11-05 LAB — RAPID URINE DRUG SCREEN, HOSP PERFORMED
AMPHETAMINES: NOT DETECTED
Barbiturates: NOT DETECTED
Benzodiazepines: NOT DETECTED
Cocaine: NOT DETECTED
OPIATES: NOT DETECTED
Tetrahydrocannabinol: NOT DETECTED

## 2016-11-05 LAB — SALICYLATE LEVEL

## 2016-11-05 LAB — ACETAMINOPHEN LEVEL: Acetaminophen (Tylenol), Serum: <10 ug/mL — ABNORMAL LOW (ref 10–30)

## 2016-11-05 LAB — ETHANOL

## 2016-11-05 MED ORDER — LORAZEPAM 1 MG PO TABS
1.0000 mg | ORAL_TABLET | ORAL | Status: AC | PRN
  Administered 2016-11-05: 1 mg via ORAL
  Filled 2016-11-05: qty 1

## 2016-11-05 MED ORDER — ZIPRASIDONE MESYLATE 20 MG IM SOLR
20.0000 mg | INTRAMUSCULAR | Status: DC | PRN
  Filled 2016-11-05: qty 20

## 2016-11-05 MED ORDER — STERILE WATER FOR INJECTION IJ SOLN
INTRAMUSCULAR | Status: AC
  Filled 2016-11-05: qty 10

## 2016-11-05 MED ORDER — DIAZEPAM 5 MG PO TABS
5.0000 mg | ORAL_TABLET | Freq: Once | ORAL | Status: AC
  Administered 2016-11-05: 5 mg via ORAL
  Filled 2016-11-05: qty 1

## 2016-11-05 MED ORDER — OLANZAPINE 5 MG PO TBDP
5.0000 mg | ORAL_TABLET | Freq: Three times a day (TID) | ORAL | Status: DC | PRN
  Administered 2016-11-05: 5 mg via ORAL
  Filled 2016-11-05: qty 1

## 2016-11-05 NOTE — ED Notes (Signed)
MD at bedside. 

## 2016-11-05 NOTE — ED Notes (Signed)
TTS at bedside. 

## 2016-11-05 NOTE — ED Notes (Signed)
Pt states "I don't want to stay here I want to leave, I don't like this place." Pt is tearful and seems to be visibly upset. This Clinical research associate tried to talk to pt, but refused and walked out the door to the lobby.
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
## 2016-11-05 NOTE — ED Notes (Signed)
Patient given turkey sandwich and sprite °

## 2016-11-05 NOTE — ED Triage Notes (Signed)
Patient BIB case manager, sent from PCP, who spoke with Dr. Jannifer Franklin. Patient c/o left leg pain and abdominal pain at site of umbilical hernia. Case manager reports patient is delusional and hallucinating. Patient denies SI/HI. States "the only thing I'm hearing is Jesus and I don't see anything wrong with that."

## 2016-11-05 NOTE — ED Notes (Signed)
This writer attempt to change out PT. PT refuse to change out state she is not staying here. Pt not IVC
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
## 2016-11-05 NOTE — ED Notes (Signed)
Bed: [facility] Expected date:  Expected time:  Means of arrival:  Comments: 

## 2016-11-05 NOTE — ED Notes (Addendum)
Pt admitted to room #41 under IVC. Pt has no insight in regards to behavior.  Pt repetitive during conversation, tangential. Continues to report she is "being punished" Encouragement and support provided, pt requiring frequent redirection. Pt denies SI/HI/AVH. Pt grandiose. Special checks q 15 mins in place for safety, Video monitoring in place. Will continue to monitor.

## 2016-11-05 NOTE — BHH Counselor (Signed)
Pt meets inpatient criteria for psychiatric stabilization per Nanine Means NP. TTS to find placement.   [address] [place], LCAS

## 2016-11-05 NOTE — ED Notes (Signed)
Patient wanded by security. 

## 2016-11-05 NOTE — ED Notes (Signed)
Patient given meal tray.

## 2016-11-05 NOTE — ED Notes (Signed)
Patient continues to have no insight in regards to behavior.  Pt repetitive during conversation, tangential. Continues to report she is "being punished" pt requiring frequent redirection. Pt denies SI/HI/AVH. Pt grandiose. Encouragement and support provided and safety maintain. Special checks q 15 mins in place for safety, Video monitoring in place. Will continue to monitor.

## 2016-11-06 DIAGNOSIS — F419 Anxiety disorder, unspecified: Secondary | ICD-10-CM

## 2016-11-06 DIAGNOSIS — R4587 Impulsiveness: Secondary | ICD-10-CM | POA: Diagnosis not present

## 2016-11-06 DIAGNOSIS — F309 Manic episode, unspecified: Secondary | ICD-10-CM

## 2016-11-06 DIAGNOSIS — R4586 Emotional lability: Secondary | ICD-10-CM

## 2016-11-06 DIAGNOSIS — R450 Nervousness: Secondary | ICD-10-CM

## 2016-11-06 DIAGNOSIS — R419 Unspecified symptoms and signs involving cognitive functions and awareness: Secondary | ICD-10-CM | POA: Diagnosis not present

## 2016-11-06 DIAGNOSIS — R443 Hallucinations, unspecified: Secondary | ICD-10-CM

## 2016-11-06 DIAGNOSIS — F250 Schizoaffective disorder, bipolar type: Secondary | ICD-10-CM | POA: Diagnosis not present

## 2016-11-06 DIAGNOSIS — F17210 Nicotine dependence, cigarettes, uncomplicated: Secondary | ICD-10-CM

## 2016-11-06 MED ORDER — POTASSIUM CHLORIDE CRYS ER 20 MEQ PO TBCR
40.0000 meq | EXTENDED_RELEASE_TABLET | Freq: Every day | ORAL | Status: DC
  Administered 2016-11-06: 40 meq via ORAL
  Filled 2016-11-06: qty 2

## 2016-11-06 MED ORDER — LORAZEPAM 1 MG PO TABS
1.0000 mg | ORAL_TABLET | Freq: Three times a day (TID) | ORAL | Status: DC | PRN
  Administered 2016-11-06: 1 mg via ORAL
  Filled 2016-11-06: qty 1

## 2016-11-06 MED ORDER — ZOLPIDEM TARTRATE 5 MG PO TABS: 5.0000 mg | ORAL_TABLET | Freq: Every day | ORAL | Status: DC

## 2016-11-06 MED ORDER — BENZTROPINE MESYLATE 1 MG PO TABS: 1.0000 mg | ORAL_TABLET | Freq: Every day | ORAL | Status: DC

## 2016-11-06 MED ORDER — BENZTROPINE MESYLATE 0.5 MG PO TABS: 0.5000 mg | ORAL_TABLET | Freq: Every day | ORAL | Status: DC

## 2016-11-06 MED ORDER — FUROSEMIDE 40 MG PO TABS
40.0000 mg | ORAL_TABLET | Freq: Every day | ORAL | Status: DC
  Administered 2016-11-06: 40 mg via ORAL
  Filled 2016-11-06: qty 1

## 2016-11-06 MED ORDER — PROPRANOLOL HCL 10 MG PO TABS
10.0000 mg | ORAL_TABLET | Freq: Three times a day (TID) | ORAL | Status: DC
  Administered 2016-11-06: 10 mg via ORAL
  Filled 2016-11-06 (×2): qty 1

## 2016-11-06 MED ORDER — LORAZEPAM 2 MG/ML IJ SOLN: 1.0000 mg | Freq: Three times a day (TID) | INTRAMUSCULAR | Status: DC | PRN

## 2016-11-06 MED ORDER — CHLORPROMAZINE HCL 25 MG/ML IJ SOLN: 25.0000 mg | Freq: Three times a day (TID) | INTRAMUSCULAR | Status: DC

## 2016-11-06 MED ORDER — ASENAPINE MALEATE 5 MG SL SUBL: 10.0000 mg | SUBLINGUAL_TABLET | Freq: Two times a day (BID) | SUBLINGUAL | Status: DC

## 2016-11-06 MED ORDER — OXCARBAZEPINE 300 MG PO TABS
300.0000 mg | ORAL_TABLET | Freq: Two times a day (BID) | ORAL | Status: DC
  Administered 2016-11-06: 300 mg via ORAL
  Filled 2016-11-06 (×2): qty 1

## 2016-11-06 MED ORDER — CHLORPROMAZINE HCL 25 MG PO TABS
25.0000 mg | ORAL_TABLET | Freq: Three times a day (TID) | ORAL | Status: DC
  Administered 2016-11-06: 25 mg via ORAL
  Filled 2016-11-06: qty 1

## 2016-11-06 NOTE — BH Assessment (Signed)
BHH Assessment Progress Note  Per Thedore Mins, MD, this pt requires psychiatric hospitalization at this time.  Pt presents under IVC initiated by pt's case manager, which EDP Derwood Kaplan, MD has upheld.  At 15:31 Broadus John calls from [facility] to report that pt has been accepted to their facility by Dr Forrestine Him.  Nanine Means, DNP concurs with this decision.  Pt's nurse, Diane, has been notified, and agrees to call report to [phone].  Pt is to be transported via [facility].  Doylene Canning, MA Triage Specialist [phone]

## 2016-11-06 NOTE — ED Notes (Signed)
Pt asked for "something for my nerves".

## 2016-11-06 NOTE — ED Notes (Signed)
Introduced self to patient.  Pt oriented to unit expectations.  Assessed pt for:  A) Anxiety &/or agitation: Pt has been agitated, worse when her daughter is here. She is delusional with flight of ideas and paranoia. She is redirectable.   S) Safety: Safety maintained with q-15-minute checks and hourly rounds by staff.  A) ADLs: Pt able to perform ADLs independently.  P) Pick-Up (room cleanliness): Pt's room clean and free of clutter.

## 2016-11-06 NOTE — ED Notes (Signed)
Pt transported to [facility] by [facility]. All belongings returned to pt who signed for same.

## 2016-11-06 NOTE — Progress Notes (Signed)
11/06/16 1348:  LRT went to pt room to offer activities.  Pt declined saying she was having cramps in her stomach because she had surgery.  At 1351, pt came in the dayroom and played cards with LRT and peer.  Pt was pleasant and bright during activity.  Pt was talking about being a Psychologist, occupational for [employer] and was singing some songs.   Catherine Bender, LRT/CTRS

## 2016-11-06 NOTE — Consult Note (Signed)
[place] [facility] Consult   Reason for Consult:  Mania  Referring Physician:  EDP Patient Identification: Catherine Bender MRN:  [id number] Principal Diagnosis: Schizoaffective disorder, bipolar type [facility]) Diagnosis:   Patient Active Problem List   Diagnosis Date Noted  . Schizoaffective disorder, bipolar type ([place]) [F25.0] 03/12/2016    Priority: High  . Hyperammonemia ([place]) [E72.20] 04/13/2016  . Agitation [R45.1]   . Change in behavior [R46.89]   . Pedal edema [R60.0] 04/06/2016  . Electrolyte imbalance [E87.8] 03/27/2016  . Prolonged Q-T interval on ECG [R94.31] 03/13/2016  . Diabetes mellitus ([place]) [E11.9] 03/12/2016    Total Time spent with patient: 45 minutes  Subjective:   Catherine Bender is a 46 y.o. female patient admitted with mania.  HPI:  46 yo female who presented to the ED with mania via her ACT team.  On assessment, pressured speech with some irritability, tangential thought processes, distractibility, delusional (believes there is a bullet in her leg).  Poor sleep, reports taking her medications daily but clearly not stable.  Past Psychiatric History: schizoaffective disorder  Risk to Self: Suicidal Ideation: No Suicidal Intent: No Is patient at risk for suicide?: No Suicidal Plan?: No Access to Means: No What has been your use of drugs/alcohol within the last 12 months?: unknown How many times?: 0 Triggers for Past Attempts: None known Intentional Self Injurious Behavior: None Risk to Others: Homicidal Ideation: No Thoughts of Harm to Others: No Current Homicidal Intent: No Current Homicidal Plan: No Access to Homicidal Means: No Identified Victim: none History of harm to others?: No Assessment of Violence: None Noted Violent Behavior Description: no Does patient have access to weapons?: No Criminal Charges Pending?: No Does patient have a court date: No Prior Inpatient Therapy: Prior Inpatient Therapy: Yes Prior Therapy Dates: january,  2018 Prior Therapy Facilty/Provider(s): [facility] Reason for Treatment: Delusions  Prior Outpatient Therapy: Prior Outpatient Therapy: Yes Does patient have an ACCT team?: Yes ([facility] CST team) Does patient have Intensive In-House Services?  : No Does patient have [facility] services? : Yes Does patient have P4CC services?: No  Past Medical History:  Past Medical History:  Diagnosis Date  . Diabetes mellitus without complication [facility])     Past Surgical History:  Procedure Laterality Date  . umbical hernia repair     Family History: History reviewed. No pertinent family history. Family Psychiatric  History: unknwon Social History:  History  Alcohol Use No     History  Drug Use No    Social History   Social History  . Marital status: Single    Spouse name: N/A  . Number of children: N/A  . Years of education: N/A   Social History Main Topics  . Smoking status: Current Every Day Smoker    Packs/day: 0.25  . Smokeless tobacco: Never Used  . Alcohol use No  . Drug use: No  . Sexual activity: Not Asked   Other Topics Concern  . None   Social History Narrative  . None   Additional Social History:    Allergies:   Allergies  Allergen Reactions  . Morphine And Related Other (See Comments)    unknown    Labs:  Results for orders placed or performed during the hospital encounter of 11/05/16 (from the past 48 hour(s))  Comprehensive metabolic panel     Status: Abnormal   Collection Time: 11/05/16  2:22 PM  Result Value Ref Range   Sodium 138 135 - 145 mmol/L   Potassium 3.5 3.5 -  5.1 mmol/L   Chloride 105 101 - 111 mmol/L   CO2 24 22 - 32 mmol/L   Glucose, Bld 145 (H) 65 - 99 mg/dL   BUN 13 6 - 20 mg/dL   Creatinine, Ser 0.88 0.44 - 1.00 mg/dL   Calcium 8.9 8.9 - 10.3 mg/dL   Total Protein 7.7 6.5 - 8.1 g/dL   Albumin 3.7 3.5 - 5.0 g/dL   AST 19 15 - 41 U/L   ALT 14 14 - 54 U/L   Alkaline Phosphatase 78 38 - 126 U/L   Total Bilirubin 0.6 0.3 - 1.2 mg/dL   GFR calc  non Af Amer >60 >60 mL/min   GFR calc Af Amer >60 >60 mL/min    Comment: (NOTE) The eGFR has been calculated using the CKD EPI equation. This calculation has not been validated in all clinical situations. eGFR's persistently <60 mL/min signify possible Chronic Kidney Disease.    Anion gap 9 5 - 15  Ethanol     Status: None   Collection Time: 11/05/16  2:22 PM  Result Value Ref Range   Alcohol, Ethyl (B) <10 <10 mg/dL    Comment:        LOWEST DETECTABLE LIMIT FOR SERUM ALCOHOL IS 10 mg/dL FOR MEDICAL PURPOSES ONLY Please note change in reference range.   Salicylate level     Status: None   Collection Time: 11/05/16  2:22 PM  Result Value Ref Range   Salicylate Lvl <7.8 2.8 - 30.0 mg/dL  Acetaminophen level     Status: Abnormal   Collection Time: 11/05/16  2:22 PM  Result Value Ref Range   Acetaminophen (Tylenol), Serum <10 (L) 10 - 30 ug/mL    Comment:        THERAPEUTIC CONCENTRATIONS VARY SIGNIFICANTLY. A RANGE OF 10-30 ug/mL MAY BE AN EFFECTIVE CONCENTRATION FOR MANY PATIENTS. HOWEVER, SOME ARE BEST TREATED AT CONCENTRATIONS OUTSIDE THIS RANGE. ACETAMINOPHEN CONCENTRATIONS >150 ug/mL AT 4 HOURS AFTER INGESTION AND >50 ug/mL AT 12 HOURS AFTER INGESTION ARE OFTEN ASSOCIATED WITH TOXIC REACTIONS.   cbc     Status: Abnormal   Collection Time: 11/05/16  2:22 PM  Result Value Ref Range   WBC 15.2 (H) 4.0 - 10.5 K/uL   RBC 4.21 3.87 - 5.11 MIL/uL   Hemoglobin 12.3 12.0 - 15.0 g/dL   HCT 36.3 36.0 - 46.0 %   MCV 86.2 78.0 - 100.0 fL   MCH 29.2 26.0 - 34.0 pg   MCHC 33.9 30.0 - 36.0 g/dL   RDW 14.0 11.5 - 15.5 %   Platelets 375 150 - 400 K/uL  Rapid urine drug screen (hospital performed)     Status: None   Collection Time: 11/05/16  2:33 PM  Result Value Ref Range   Opiates NONE DETECTED NONE DETECTED   Cocaine NONE DETECTED NONE DETECTED   Benzodiazepines NONE DETECTED NONE DETECTED   Amphetamines NONE DETECTED NONE DETECTED   Tetrahydrocannabinol NONE DETECTED  NONE DETECTED   Barbiturates NONE DETECTED NONE DETECTED    Comment:        DRUG SCREEN FOR MEDICAL PURPOSES ONLY.  IF CONFIRMATION IS NEEDED FOR ANY PURPOSE, NOTIFY LAB WITHIN 5 DAYS.        LOWEST DETECTABLE LIMITS FOR URINE DRUG SCREEN Drug Class       Cutoff (ng/mL) Amphetamine      1000 Barbiturate      200 Benzodiazepine   295 Tricyclics       621 Opiates  300 Cocaine          300 THC              50     Current Facility-Administered Medications  Medication Dose Route Frequency Provider Last Rate Last Dose  . OLANZapine zydis (ZYPREXA) disintegrating tablet 5 mg  5 mg Oral Q8H PRN Varney Biles, MD   5 mg at 11/05/16 1324   And  . ziprasidone (GEODON) injection 20 mg  20 mg Intramuscular PRN Varney Biles, MD       Current Outpatient Prescriptions  Medication Sig Dispense Refill  . benztropine (COGENTIN) 1 MG tablet Take 1 tablet (1 mg total) by mouth at bedtime. 30 tablet 0  . cyclobenzaprine (FLEXERIL) 10 MG tablet Take 10 mg by mouth 2 (two) times daily as needed for muscle spasms.  0  . OLANZapine (ZYPREXA) 2.5 MG tablet Take 2.5 mg by mouth daily with breakfast.  2  . PROVENTIL HFA 108 (90 Base) MCG/ACT inhaler Take 2 puffs by mouth every 4 (four) hours as needed for shortness of breath or wheezing.  2  . furosemide (LASIX) 40 MG tablet Take 1 tablet (40 mg total) by mouth daily. 30 tablet 0  . gabapentin (NEURONTIN) 300 MG capsule Take 2 capsules (600 mg total) by mouth at bedtime. 60 capsule 0  . lithium carbonate 300 MG capsule Take 1 capsule (300 mg total) by mouth 3 (three) times daily with meals. 90 capsule 0  . magnesium oxide (MAG-OX) 400 (241.3 Mg) MG tablet Take 1 tablet (400 mg total) by mouth 2 (two) times daily. 60 tablet 0  . nicotine polacrilex (NICORETTE) 2 MG gum Take 1 each (2 mg total) by mouth as needed for smoking cessation. 100 tablet 0  . OLANZapine zydis (ZYPREXA) 20 MG disintegrating tablet Take 1 tablet (20 mg total) by mouth at  bedtime. 30 tablet 0  . OLANZapine zydis (ZYPREXA) 5 MG disintegrating tablet Take 1 tablet (5 mg total) by mouth daily. 30 tablet 0  . Oxcarbazepine (TRILEPTAL) 300 MG tablet Take 1 tablet (300 mg total) by mouth 2 (two) times daily. 60 tablet 0  . potassium chloride SA (K-DUR,KLOR-CON) 20 MEQ tablet Take 2 tablets (40 mEq total) by mouth daily. 60 tablet 0  . propranolol (INDERAL) 10 MG tablet Take 1 tablet (10 mg total) by mouth 3 (three) times daily. 90 tablet 0  . temazepam (RESTORIL) 30 MG capsule Take 1 capsule (30 mg total) by mouth at bedtime. 30 capsule 0    Musculoskeletal: Strength & Muscle Tone: within normal limits Gait & Station: normal Patient leans: N/A  Psychiatric Specialty Exam: Physical Exam  Constitutional: She is oriented to person, place, and time. She appears well-developed and well-nourished.  HENT:  Head: Normocephalic.  Neck: Normal range of motion.  Respiratory: Effort normal.  Musculoskeletal: Normal range of motion.  Neurological: She is alert and oriented to person, place, and time.  Psychiatric: Her mood appears anxious. Her affect is labile. Her speech is rapid and/or pressured and tangential. She is actively hallucinating. Thought content is delusional. Cognition and memory are impaired. She expresses impulsivity.    Review of Systems  Psychiatric/Behavioral: Positive for hallucinations. The patient is nervous/anxious.   All other systems reviewed and are negative.   Blood pressure (!) 158/85, pulse 100, temperature 97.7 F (36.5 C), temperature source Oral, resp. rate 18, SpO2 98 %.There is no height or weight on file to calculate BMI.  General Appearance: Casual  Eye Contact:  Fair  Speech:  Pressured  Volume:  Increased at times  Mood:  Anxious and Irritable  Affect:  Blunt  Thought Process:  Descriptions of Associations: Tangential  Orientation:  Full (Time, Place, and Person)  Thought Content:  Delusions and Hallucinations: Auditory   Suicidal Thoughts:  No  Homicidal Thoughts:  No  Memory:  Immediate;   Fair Recent;   Fair Remote;   Fair  Judgement:  Impaired  Insight:  Lacking  Psychomotor Activity:  Increased  Concentration:  Concentration: Poor and Attention Span: Poor  Recall:  [employer] of Knowledge:  Fair  Language:  Good  Akathisia:  No  Handed:  Right  AIMS (if indicated):     Assets:  Housing Leisure Time Physical Health Resilience Social Support  ADL's:  Intact  Cognition:  Impaired,  Moderate  Sleep:        Treatment Plan Summary: Daily contact with patient to assess and evaluate symptoms and progress in treatment, Medication management and Plan schizoaffective disorder, bipolar type:  -Crisis stabilization -Medication management:  Due to her prolonged QT level, Thorazine 25 mg TID oral or IM was started along with Cogentin 0.5 mg daily for EPS and Trileptal 300 mg BID for mood stabilization.  Ativan 1 mg TID for agitation, anxiety po or IM -Individual counseling  Disposition: Recommend psychiatric Inpatient admission when medically cleared.  Waylan Boga, NP 11/06/2016 11:13 AM  Patient seen face-to-face for psychiatric evaluation, chart reviewed and case discussed with the physician extender and developed treatment plan. Reviewed the information documented and agree with the treatment plan. Corena Pilgrim, MD

## 2017-01-04 ENCOUNTER — Other Ambulatory Visit: Payer: Self-pay | Admitting: Internal Medicine

## 2017-01-04 DIAGNOSIS — Z1231 Encounter for screening mammogram for malignant neoplasm of breast: Secondary | ICD-10-CM

## 2017-01-25 ENCOUNTER — Ambulatory Visit: Payer: Medicaid Other | Admitting: Obstetrics

## 2017-02-05 ENCOUNTER — Ambulatory Visit
Admission: RE | Admit: 2017-02-05 | Discharge: 2017-02-05 | Disposition: A | Discharge status: Home / Self Care | Payer: Medicaid Other | Source: Ambulatory Visit | Attending: Internal Medicine | Admitting: Internal Medicine

## 2017-02-05 DIAGNOSIS — Z1231 Encounter for screening mammogram for malignant neoplasm of breast: Secondary | ICD-10-CM

## 2017-02-08 ENCOUNTER — Other Ambulatory Visit: Payer: Self-pay | Admitting: Internal Medicine

## 2017-02-08 DIAGNOSIS — R928 Other abnormal and inconclusive findings on diagnostic imaging of breast: Secondary | ICD-10-CM

## 2017-02-12 ENCOUNTER — Other Ambulatory Visit: Payer: Self-pay

## 2017-02-15 ENCOUNTER — Other Ambulatory Visit: Payer: Self-pay | Admitting: Family

## 2017-02-15 ENCOUNTER — Ambulatory Visit
Admission: RE | Admit: 2017-02-15 | Discharge: 2017-02-15 | Disposition: A | Discharge status: Home / Self Care | Payer: Medicaid Other | Source: Ambulatory Visit | Attending: Family | Admitting: Family

## 2017-02-15 DIAGNOSIS — R1012 Left upper quadrant pain: Secondary | ICD-10-CM

## 2017-04-02 ENCOUNTER — Inpatient Hospital Stay: Admission: RE | Admit: 2017-04-02 | Payer: Self-pay | Source: Ambulatory Visit

## 2017-04-06 ENCOUNTER — Other Ambulatory Visit: Payer: Self-pay

## 2017-04-16 ENCOUNTER — Inpatient Hospital Stay: Admission: RE | Admit: 2017-04-16 | Payer: Self-pay | Source: Ambulatory Visit

## 2017-05-08 ENCOUNTER — Emergency Department (HOSPITAL_COMMUNITY)
Admission: EM | Admit: 2017-05-08 | Discharge: 2017-05-10 | Disposition: E | Payer: Medicaid Other | Attending: Emergency Medicine | Admitting: Emergency Medicine

## 2017-05-08 DIAGNOSIS — R4182 Altered mental status, unspecified: Secondary | ICD-10-CM | POA: Diagnosis present

## 2017-05-08 DIAGNOSIS — I469 Cardiac arrest, cause unspecified: Secondary | ICD-10-CM | POA: Insufficient documentation

## 2017-05-08 MED ORDER — SODIUM BICARBONATE 8.4 % IV SOLN
INTRAVENOUS | Status: AC | PRN
  Administered 2017-05-08: 50 meq via INTRAVENOUS

## 2017-05-08 MED ORDER — CALCIUM CHLORIDE 10 % IV SOLN
INTRAVENOUS | Status: AC | PRN
  Administered 2017-05-08: 1 g via INTRAVENOUS

## 2017-05-08 MED ORDER — EPINEPHRINE PF 1 MG/10ML IJ SOSY
PREFILLED_SYRINGE | INTRAMUSCULAR | Status: AC | PRN
  Administered 2017-05-08: 1 via INTRAVENOUS

## 2017-05-08 MED ORDER — SODIUM CHLORIDE 0.9 % IV SOLN
INTRAVENOUS | Status: AC | PRN
  Administered 2017-05-08: 1000 mL via INTRAVENOUS

## 2017-05-10 MED FILL — Medication: Qty: 1 | Status: AC

## 2017-05-10 NOTE — ED Notes (Signed)
Received a call from another daughter. States that the family does want the full autopsy, but they just wanted to see pt before that happened. Explained that since she is a ME case, that would not be possible. Wants this RN to call the ME back and tell him they want the full autopsy. States that she will be here shortly to pick up belongings.

## 2017-05-10 NOTE — ED Notes (Signed)
Paged the medical examiner per Dr. Roger Shelterong

## 2017-05-10 NOTE — Progress Notes (Addendum)
Patient brought in for CPR.  She passed away and the boyfriend Catherine Bender needed to make phone calls. I made some calls from the desk so he could call due to not having his own personal phone. He called daughter  Of patient who was very upset [place].  Doctor Catherine Bender  spoke with her to say her mother had passed away and answered some questions.  I have been seeking to comfort the grieving boyfriend who states they were to be married soon. ED desk called to say the husband (ex) had called and ex husband name and phone number  Catherine Bender [phone]  Catherine Bender contact info [address], [place], [place] [address] Phone [phone]  Boyfriend states he has cared for the patient for past 3 years or so and there were family issues. The family is upset over her death and he is not looking forward to them arriving here. Catherine Bender, Chaplain   10-01-2017 1800  Clinical Encounter Type  Visited With Other (Comment) (boyfriend)  Visit Type Initial;Spiritual support  Referral From Care management  Consult/Referral To Chaplain  Spiritual Encounters  Spiritual Needs Prayer;Emotional;Grief support   Some additional family contacts from the deceased patient's phone.  Mother of patient Ms. Catherine Bender [phone]  Daughter of patient - not sure if she has been notified yet of death  [place] [phone]  Son Catherine Bender [phone] ( I think local)  Daughter Catherine Bender ( na-na) Could not find her phone number on phone The daughter is coming from the beach and spoke with Dr. earlier.  She will likely want to view the deceased when she arrives. Boyfriend still here as far as I know-went outside the Emergency area waiting room  to smoke a cigarette. Next of kin would be her children.    [phone]

## 2017-05-10 NOTE — ED Notes (Signed)
Thayer Ohmhris, Armed forces technical officernursing supervisor, spoke with family. All belongings down to morgue and placed with patient per Thayer Ohmhris, Charity fundraiserN.  Family aware.

## 2017-05-10 NOTE — Code Documentation (Signed)
Patient time of death occurred at [id number].  MD Jodi MourningZavitz and DO Dong at bedside to call TOD, Boyfriend outside room, did not want to watch.  Boyfriend updated on patient condition by DO Dong.

## 2017-05-10 NOTE — ED Provider Notes (Signed)
[facility] EMERGENCY DEPARTMENT Provider Note   CSN: [id number] Arrival date & time: May 27, 2017  1753     History   Chief Complaint Chief Complaint  Patient presents with  . Cardiac Arrest    HPI Catherine Bender is a 47 y.o. female.  The history is provided by the patient.  Cardiac Arrest  Witnessed by:  Not witnessed Incident location:  Home Time since incident:  2 hours Time before BLS initiated:  > 5 minutes Time before ALS initiated:  > 10 minutes Condition upon EMS arrival:  Unresponsive Pulse:  Absent Initial cardiac rhythm per EMS:  Asystole Treatments prior to arrival:  ACLS protocol and vascular access (king airway) IV access type:  Intraosseous Airway:  Combitube Rhythm on admission to ED:  PEA Risk factors: no drug overdose (EMS crew saw appropriate number of her psych meds in her med packs)   -Per the patient's boyfriend, patient had not been feeling well recently but has no other specific history.  No past medical history on file.  There are no active problems to display for this patient.     OB History   None      Home Medications    Prior to Admission medications   Not on File    Family History No family history on file.  Social History Social History   Tobacco Use  . Smoking status: Not on file  Substance Use Topics  . Alcohol use: Not on file  . Drug use: Not on file     Allergies   Patient has no allergy information on record.   Review of Systems Review of Systems  Unable to perform ROS: Acuity of condition  Level 5 caveat: Cardiac arrest   Physical Exam Updated Vital Signs BP (!) 85/41   Pulse (!) 54   Resp 12   Wt 113.4 kg (250 lb)   SpO2 (!) 62%   Physical Exam  Constitutional: She appears well-developed and well-nourished. She appears distressed.  HENT:  Head: Normocephalic and atraumatic.  Eyes:  Pupils blown, unreactive   Neck: Neck supple.  Cardiovascular:  Pulseless, bradycardic  PEA on the monitor  Pulmonary/Chest:  Apneic  Abdominal: Soft. She exhibits distension.  Neurological: She is unresponsive. GCS eye subscore is 1. GCS verbal subscore is 1. GCS motor subscore is 1.  Nursing note and vitals reviewed.    ED Treatments / Results  Labs (all labs ordered are listed, but only abnormal results are displayed) Labs Reviewed - No data to display  EKG None  Radiology No results found.  Procedures Procedures (including critical care time)  Medications Ordered in ED Medications  0.9 %  sodium chloride infusion ( Intravenous Stopped May 27, 2017 1806)  EPINEPHrine (ADRENALIN) 1 MG/10ML injection (1 Syringe Intravenous Given 27-May-2017 1758)  EPINEPHrine (ADRENALIN) 1 MG/10ML injection (1 Syringe Intravenous Given 05-27-17 1803)  sodium bicarbonate injection (50 mEq Intravenous Given 27-May-2017 1758)  calcium chloride injection (1 g Intravenous Given May 27, 2017 1758)     Initial Impression / Assessment and Plan / ED Course  I have reviewed the triage vital signs and the nursing notes.  Pertinent labs & imaging results that were available during my care of the patient were reviewed by me and considered in my medical decision making (see chart for details).     Patient is a 47 year old female who presents in cardiac arrest.  Patient was last seen awake and talking around 4:30 PM patient was found pulseless and unresponsive about 90 minutes  later.  EMS was called and CPR initiated.  Initial rhythm was asystole.  After 6 or 7 rounds of epinephrine and CPR patient had a PEA rhythm that was wide-complex and bradycardic.  On arrival here she has PEA with rate in the 40s.  She is pulseless.  Patient received a total of 9 rounds of epinephrine and CPR prior to arrival at about 45 minutes of CPR.  Patient is given a couple more rounds of epinephrine here and continue chest compressions.  Patient is bagging easily and has bilateral breath sounds.  Patient is also given calcium and  bicarbonate.  After a few pulse checks as well as bedside ultrasound, there is a cardiac still.  Further resuscitative measures are terminated as they would be futile due to the length of cardiac arrest and likely significantly long downtime.  Time of death is [id number].  Medical examiner notified and this will be a ME case.  Family is notified of the patient's passing.    Final Clinical Impressions(s) / ED Diagnoses   Final diagnoses:  Cardiac arrest [facility](HCC)    ED Discharge Orders    None       Dwana MelenaDong, Campbell Kray, DO 05/09/17 0130    Blane OharaZavitz, Joshua, MD 05/10/17 [phone]

## 2017-05-10 NOTE — ED Notes (Signed)
Spoke with ME, pt is not to be viewed here in the ED by family. ME aware [place], and other areas. States that pt is to be taken directly to the morgue and if family wants to view pt, they can speak with the house supervisor and they can take them to the morgue for viewing. Called house supervisor and spoke with Thayer OhmChris, states that pt will not be viewed by anyone tonight and they will have to wait to see her at the [facility] home. Spoke with pts boyfriend, Mellody DanceKeith, and he is aware. This RN will try to get in touch with family that are driving here from out of town. Charge RN, Irving BurtonEmily, aware.

## 2017-05-10 NOTE — ED Notes (Addendum)
Watch, bracelet, ring, necklace, and keys in bag with pts name on them.

## 2017-05-10 NOTE — ED Notes (Addendum)
Spoke with Daughter, Marcelle Smilingatasha, who states that she has POA over pt. States that she wants a full autopsy no matter what other family members state. Family upset that they will not be able to see pt tonight at the hospital. Spoke with house supervisor and states that they are more than welcome to come to the hospital and speak with her but they will not be able to see patient while she is in the morgue. Family aware and very upset. Told daughter that she should speak with other family members, because another family member, Isiah Doran ClayRawlings and his wife called, and stated that they do not want an autopsy. Daughter has my number and will call this RN back. Told daughter about pts valuables, and she wants this RN to hold on to them and she will come and get them.

## 2017-05-10 NOTE — ED Notes (Signed)
Attempted to call daughter Alcus Dadatasha Ely at [phone]  No answer, left message to called emergency room back as soon as possible.

## 2017-05-10 DEATH — deceased

## 2017-06-09 NOTE — ED Triage Notes (Signed)
Patient comes to ED with CPR in progress.  Patient was seen at 1600 and breathing and talking on her own.  Boyfriend found her at 1700 unresponsive.  EMS called and CPR started. 6 EPi given, 500cc fluids.  Pulse checks absent for entirety of code.  Down time approximately 45 min to this point.  CPR ongoing in the ED.  No change is pulse status.  MD to preform ultrasound neck check.

## 2017-11-08 IMAGING — CT CT HEAD W/O CM
3 of 6 series · 15 of 47 positions shown, 18 images · non-contrast
Comparison: None.

CLINICAL DATA: Altered mental status, delirium, schizoaffective
disorder

EXAM:
CT HEAD WITHOUT CONTRAST
TECHNIQUE: Contiguous axial images were obtained from the base of the skull
through the vertex without intravenous contrast.

[Series 2: head w/o · axial · non-contrast · 0.48mm/px · z∈[-78,+42]mm · 9 of 31 slices shown, 12 images]
[im 4/31  brain]
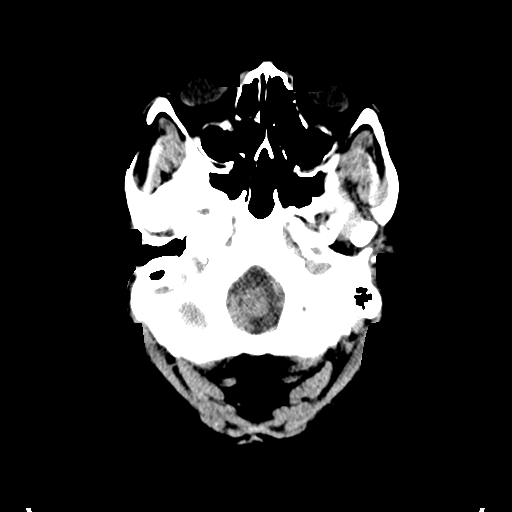
[im 4/31  bone]
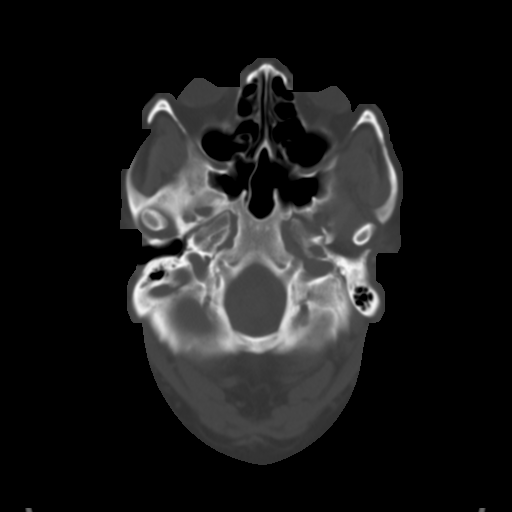
[im 7/31  brain]
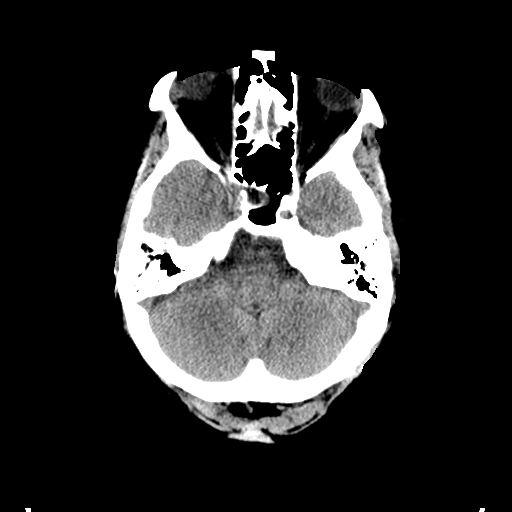
[im 10/31  brain]
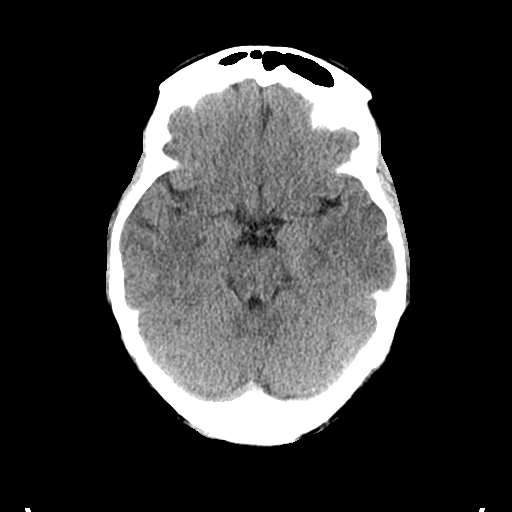
[im 13/31  brain]
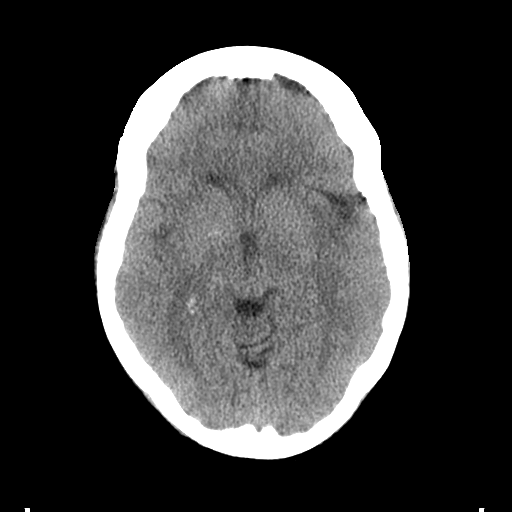
[im 16/31  brain]
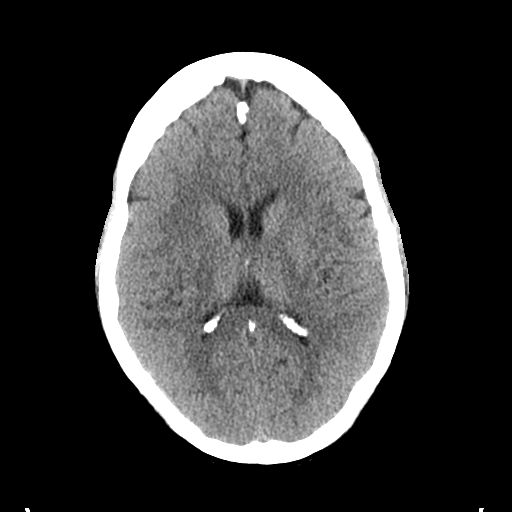
[im 16/31  bone]
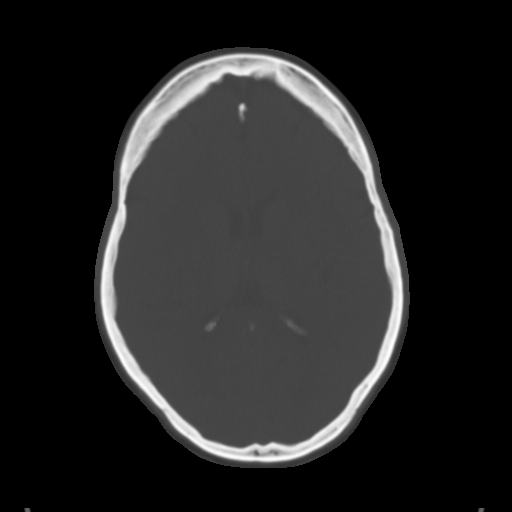
[im 19/31  brain]
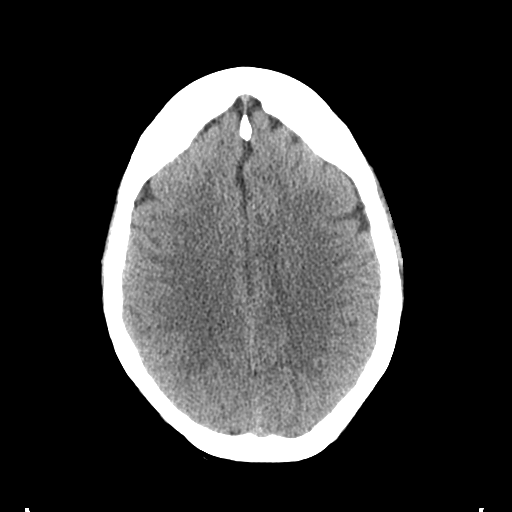
[im 22/31  brain]
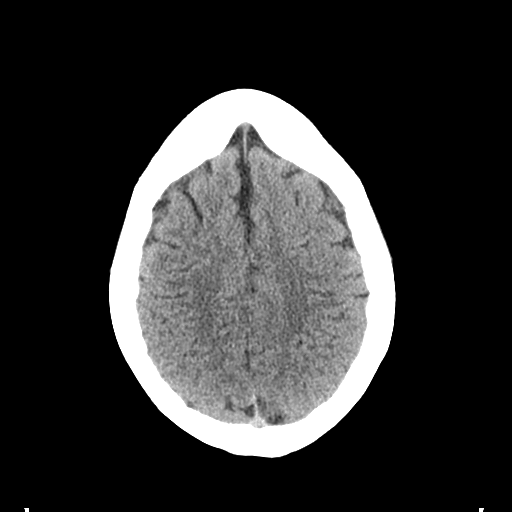
[im 25/31  brain]
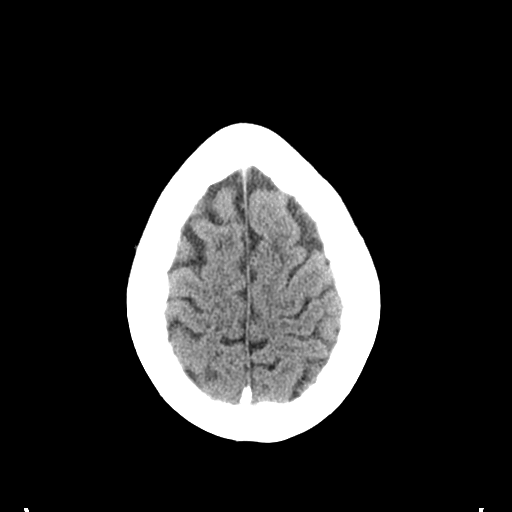
[im 28/31  brain]
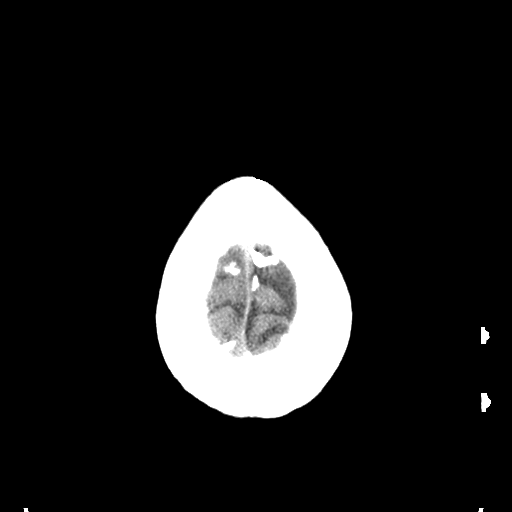
[im 28/31  bone]
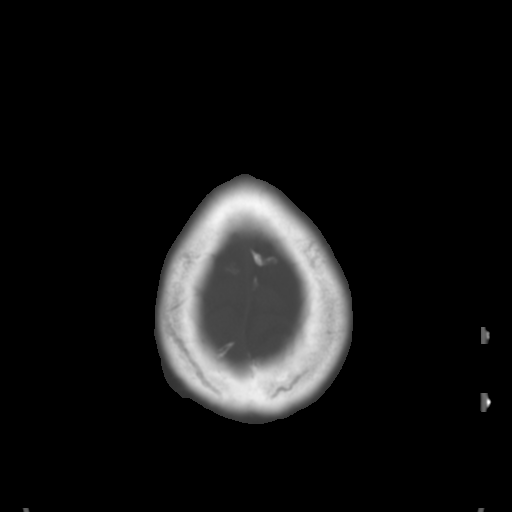

[Series 6: coronal · coronal · 0.33mm/px · 3 of 70 slices shown]
[im 24/70  brain]
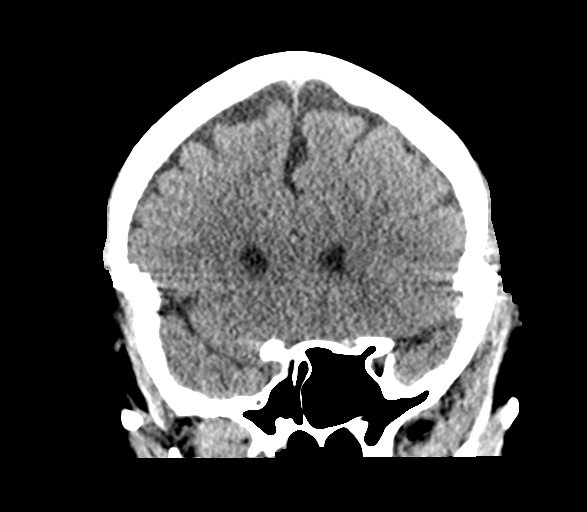
[im 31/70  brain]
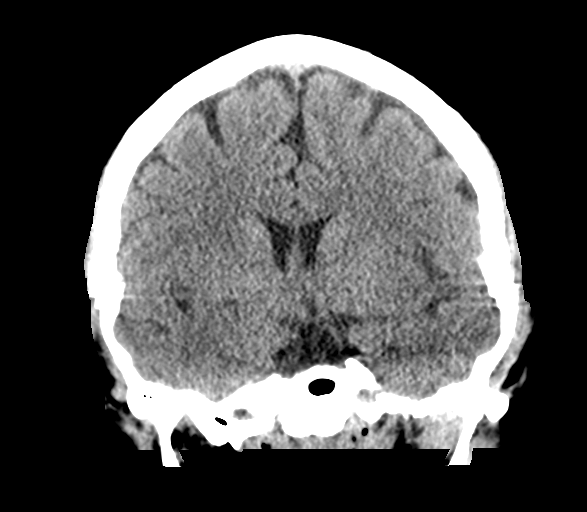
[im 39/70  brain]
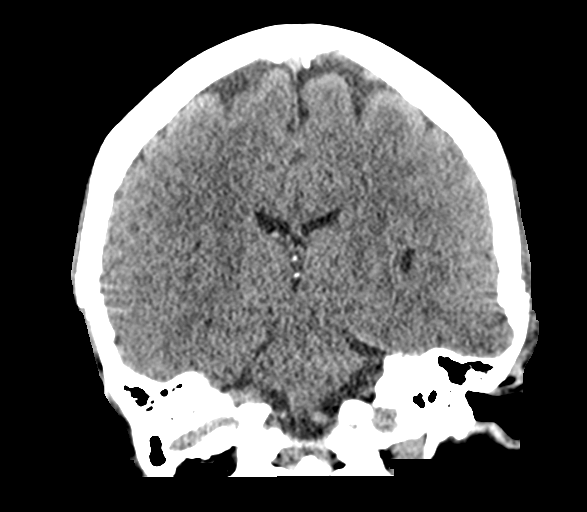

[Series 7: sagittal · sagittal · 0.37mm/px · 3 of 51 slices shown]
[im 17/51  brain]
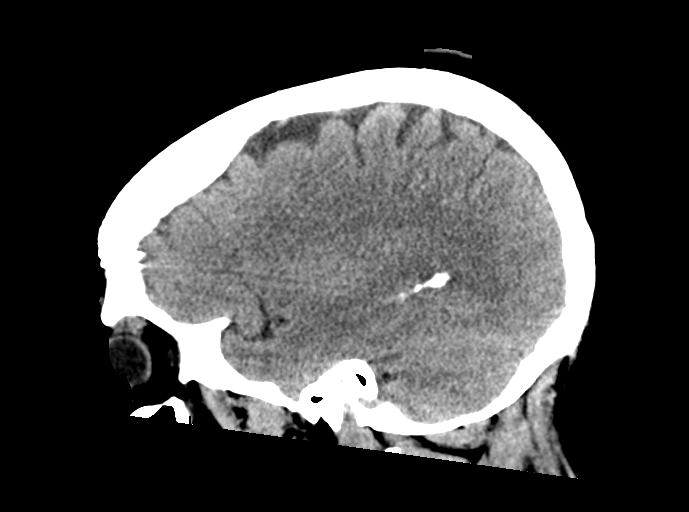
[im 26/51  brain]
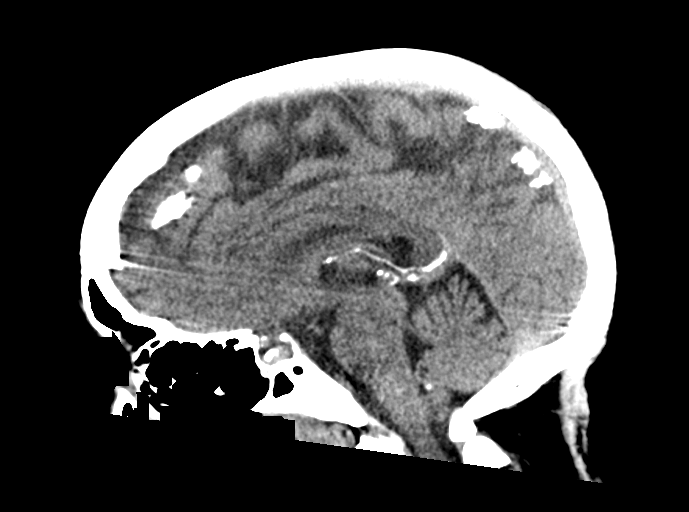
[im 34/51  brain]
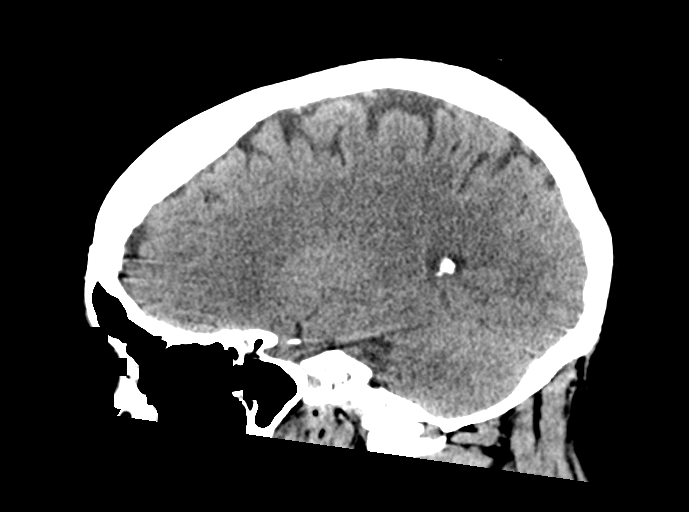

[15 of 47 positions shown; findings below may reference images not displayed]

FINDINGS: Brain: No evidence of acute infarction, hemorrhage, hydrocephalus,
extra-axial collection or mass lesion/mass effect.

Partially empty sella, likely incidental in the absence of chronic
headaches.

Vascular: No hyperdense vessel or unexpected calcification.

Skull: Normal. Negative for fracture or focal lesion.

Sinuses/Orbits: No acute finding.

Other: None.
IMPRESSION: No evidence of acute intracranial abnormality.

## 2017-11-08 IMAGING — CR DG CHEST 2V
2 series · 2 of 2 positions shown · non-contrast
Comparison: 10/13/2015

CLINICAL DATA: Change in mental status

EXAM:
CHEST  2 VIEW

[w chest lat]
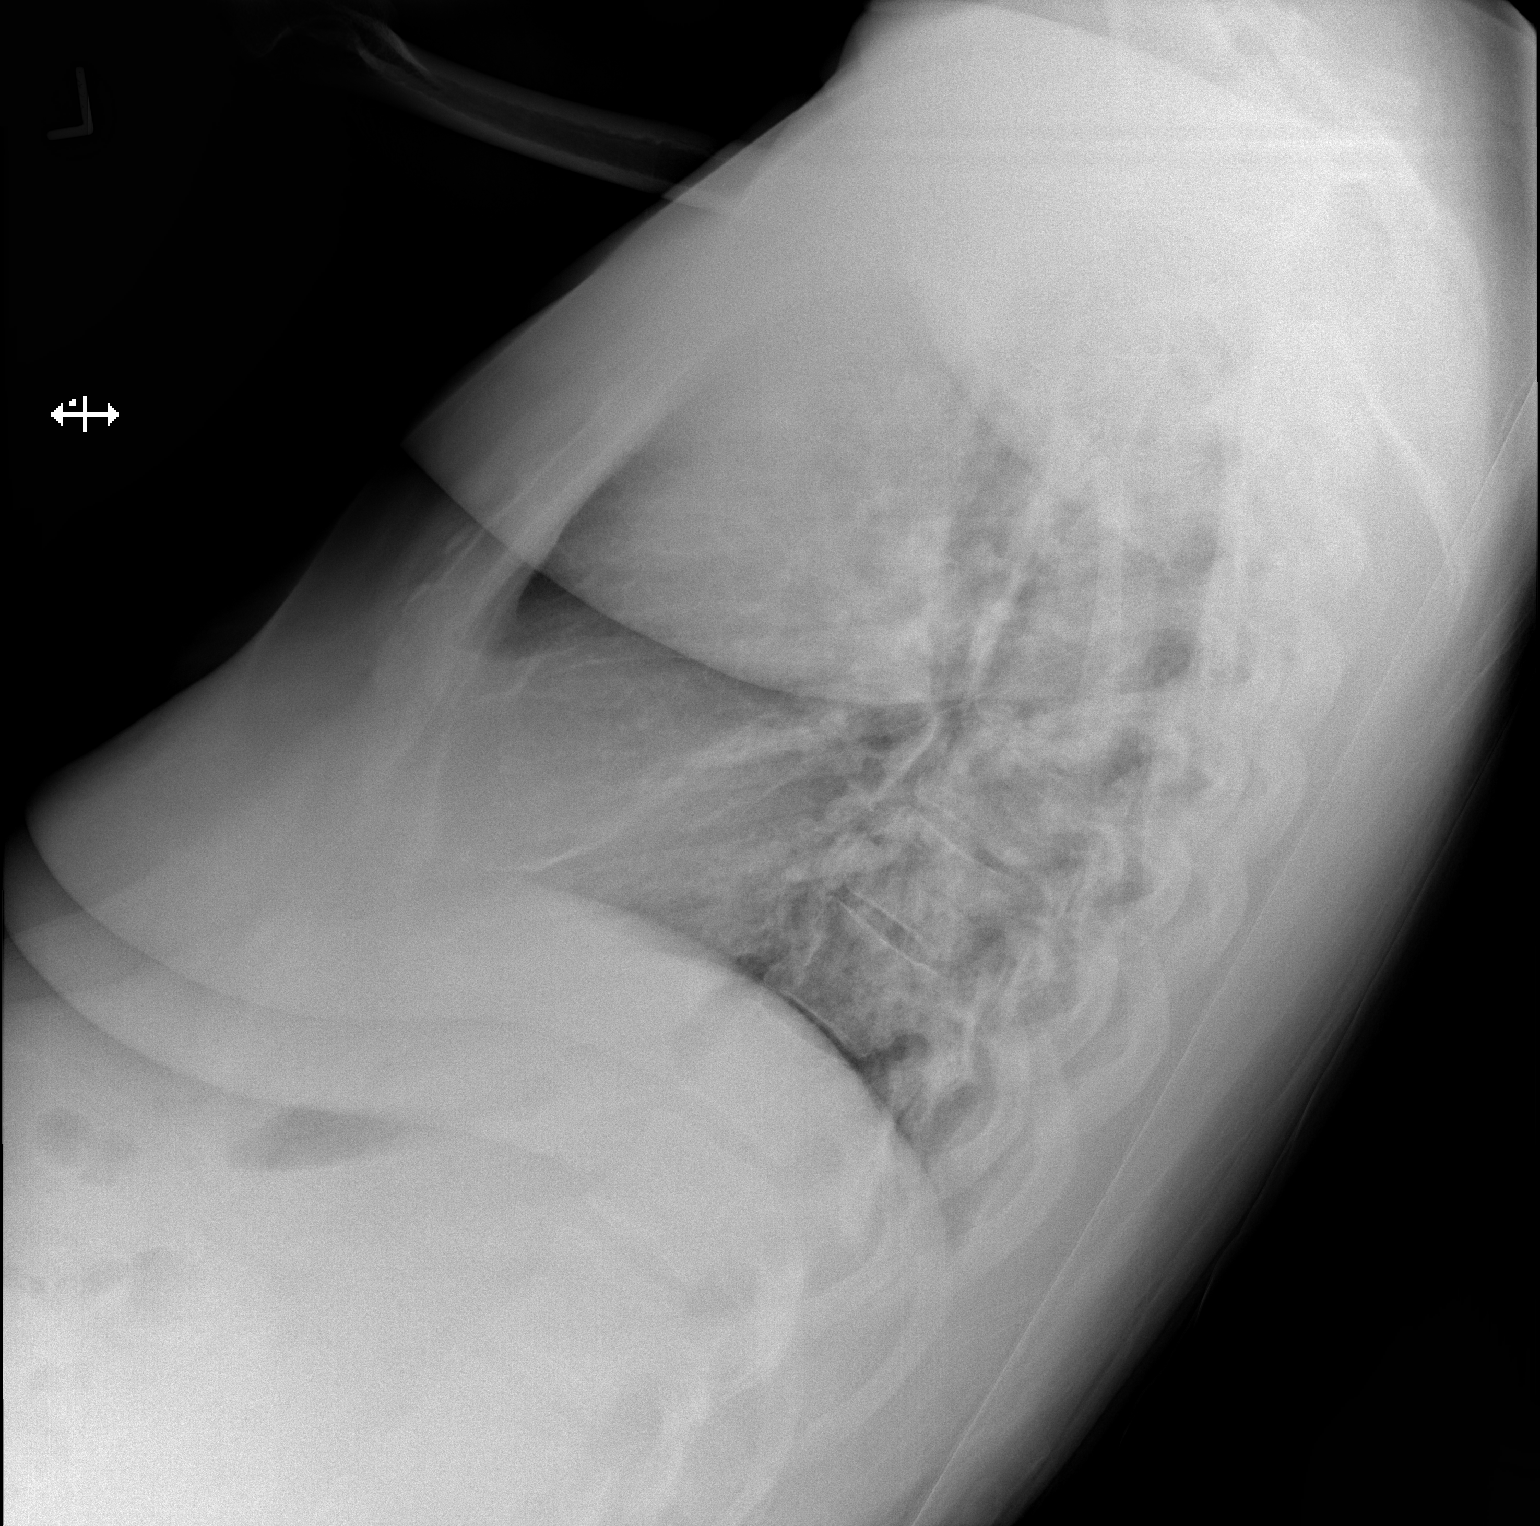

[x chest ap]
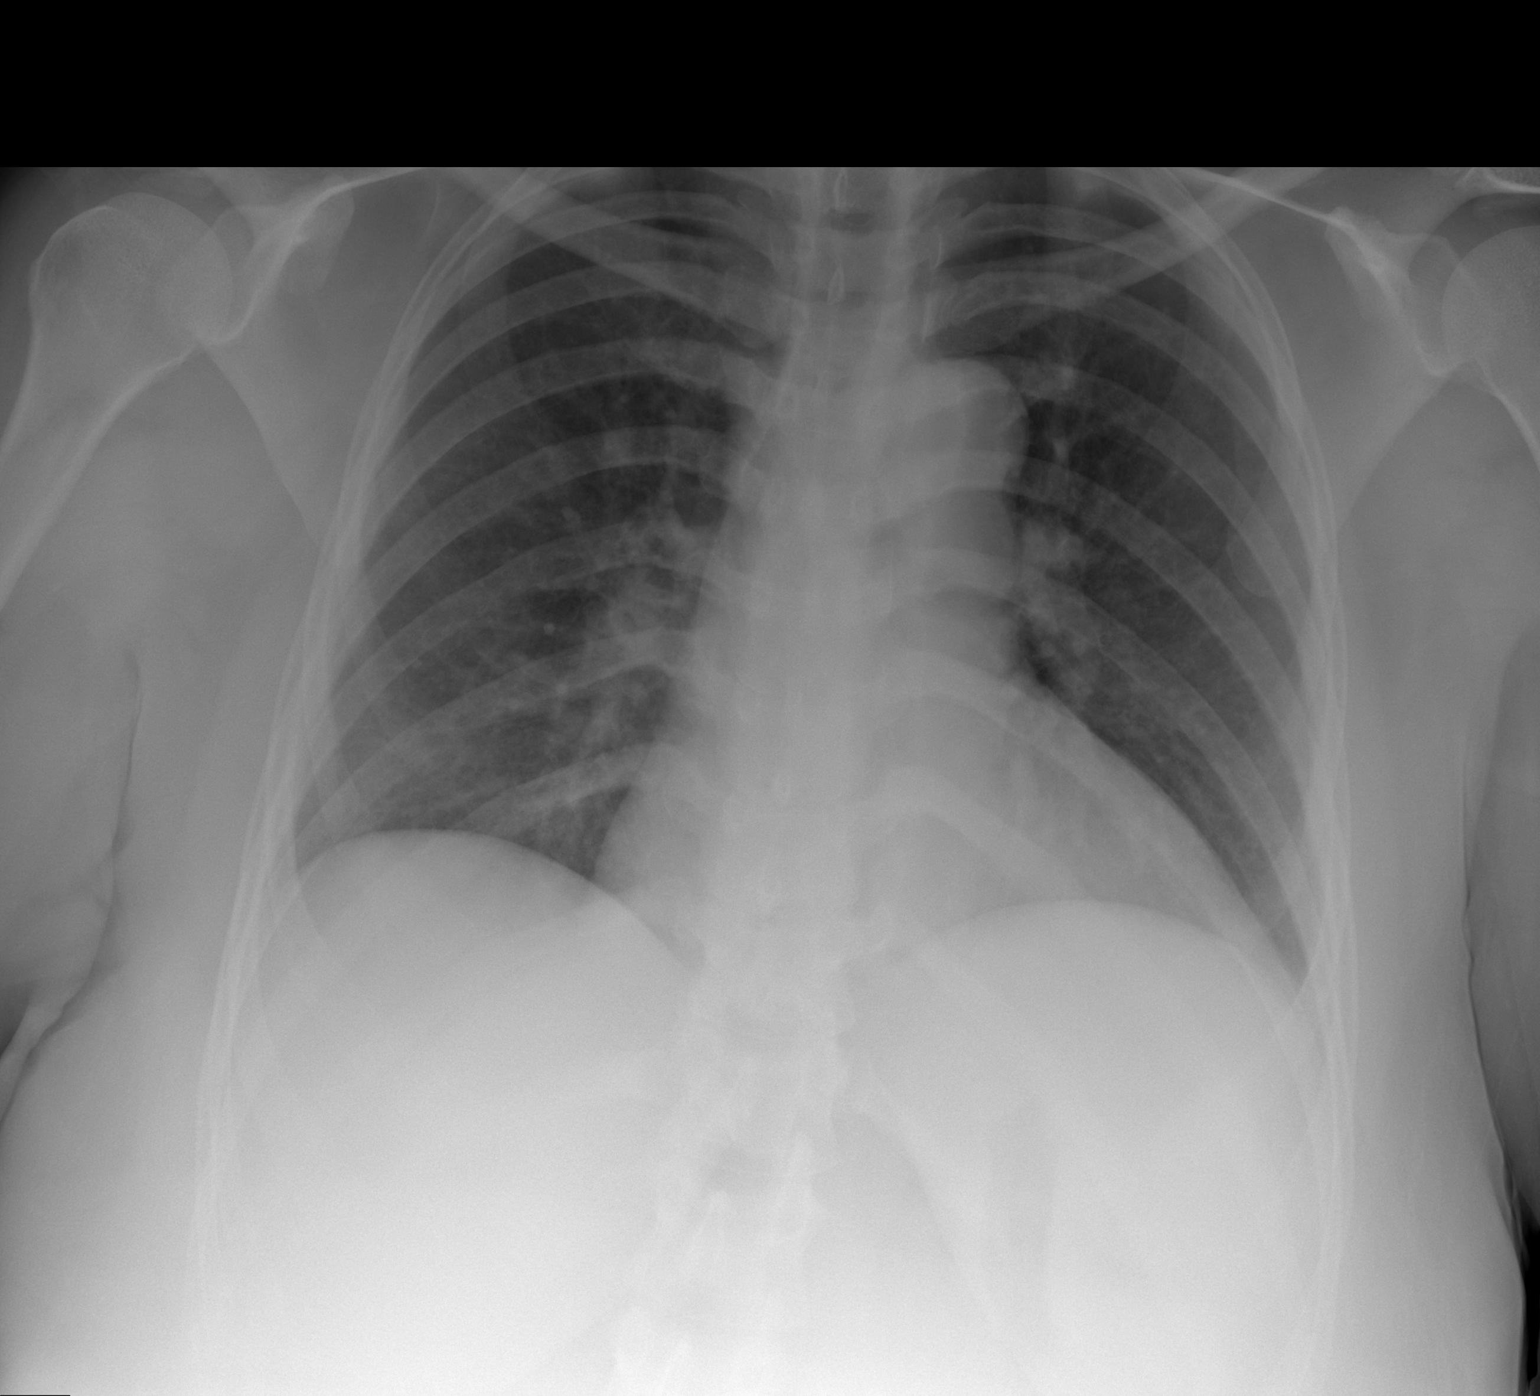

[2 of 2 positions shown; findings below may reference images not displayed]

FINDINGS: Mild cardiomegaly, accentuated by low volumes. Mild atelectasis or
scarring seen in the lateral projection. There is no edema,
consolidation, effusion, or pneumothorax. Symmetric biapical pleural
thickening. No acute or aggressive finding.
IMPRESSION: 1. Mild cardiomegaly without failure.
2. Mild atelectasis or scarring.

## 2018-10-13 IMAGING — CR DG ABDOMEN 1V
1 series · 1 of 1 positions shown · non-contrast
Comparison: None.

CLINICAL DATA: 46-year-old female with periumbilical pain for the
past month. Concern for constipation versus small bowel obstruction.
Initial encounter.

EXAM:
ABDOMEN - 1 VIEW

[w abdomen upright]
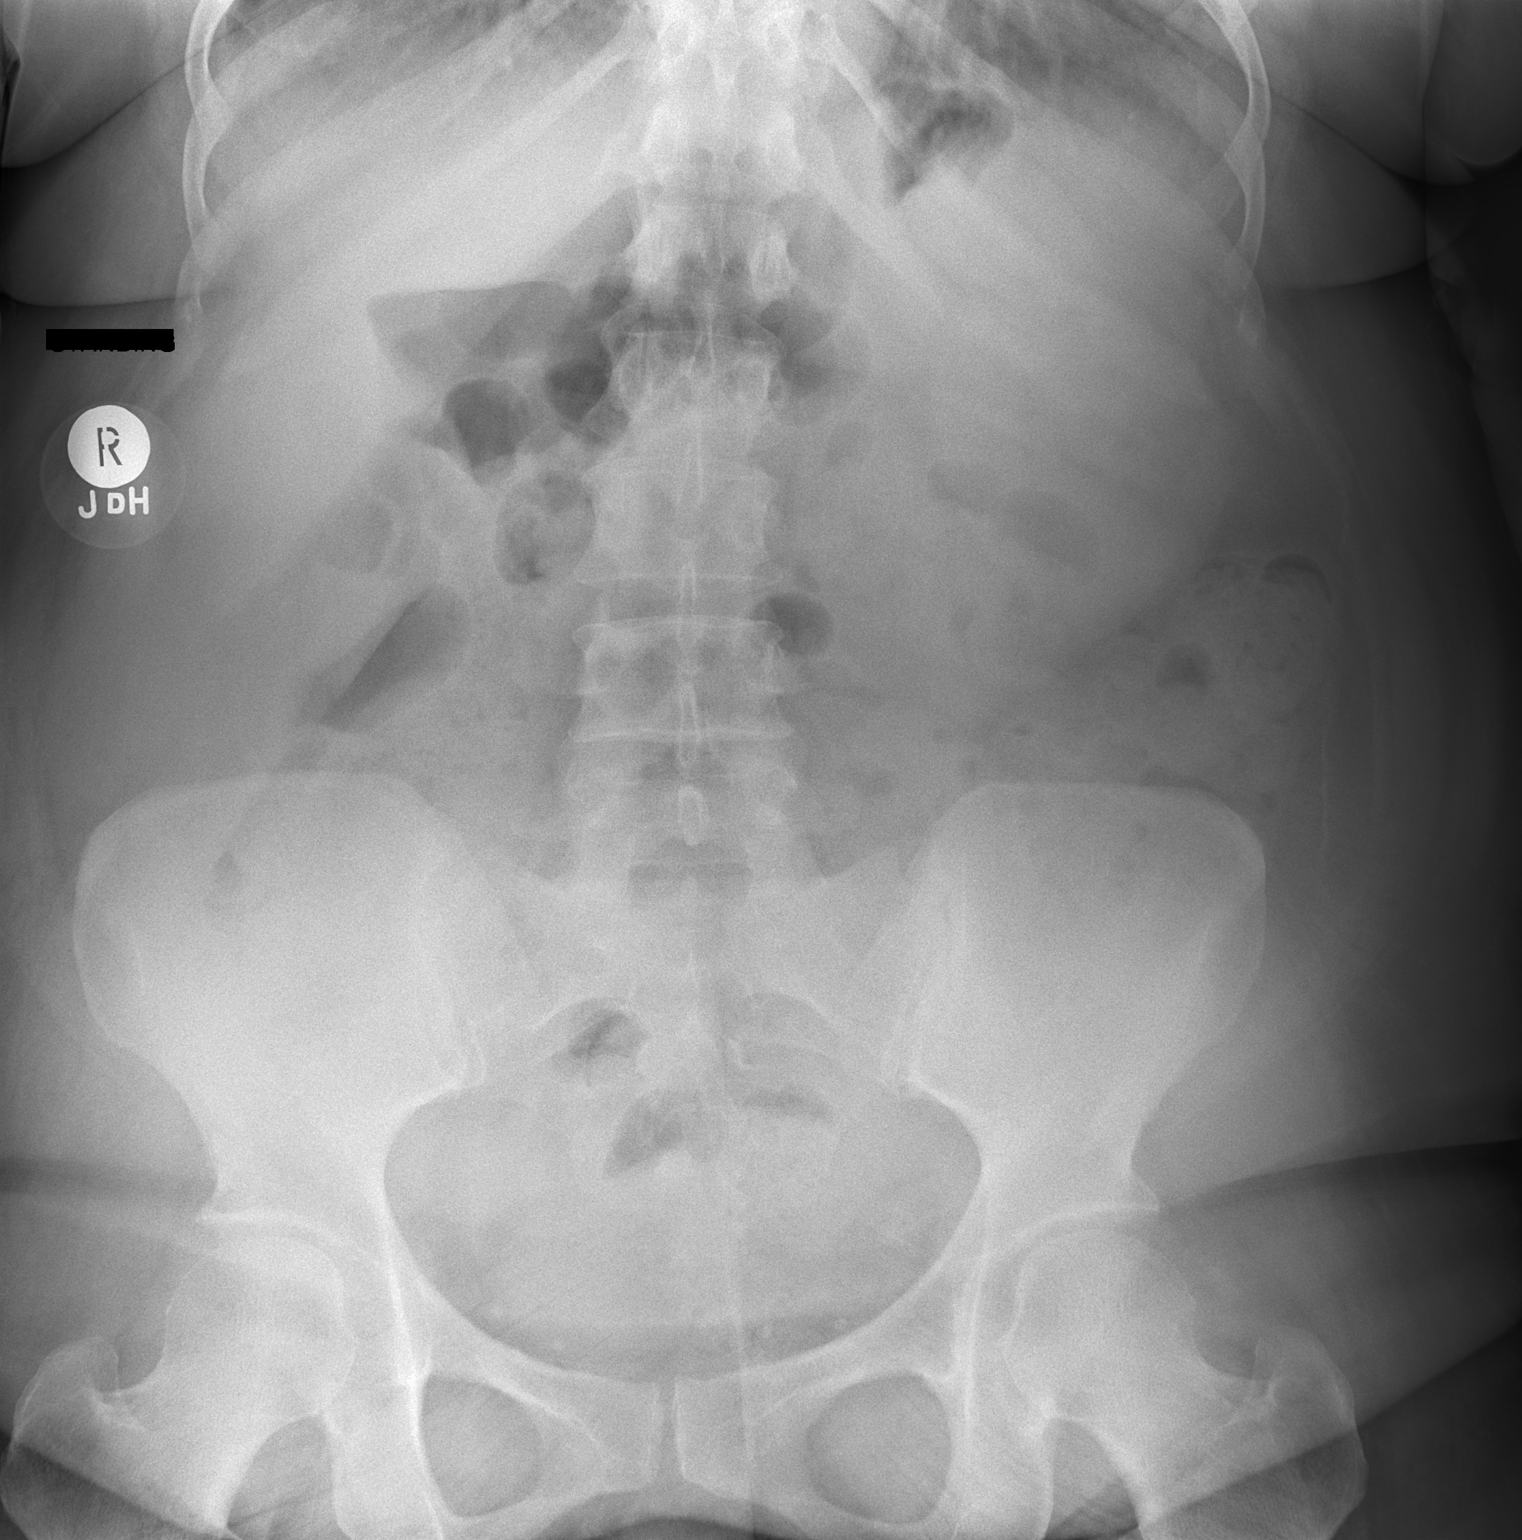

[1 of 1 positions shown; findings below may reference images not displayed]

FINDINGS: Gas-filled bowel loops central upper abdomen in a nonspecific
distribution. Appearance is not typical of small-bowel obstruction
and possibly normal.

Moderate stool burden within the range of normal limits.

The possibility of free intraperitoneal air cannot be assessed on a
supine view.

Liver may be prominent in size.

No osseous abnormality noted.
IMPRESSION: Gas-filled bowel loops central upper abdomen in a nonspecific
distribution. Appearance is not typical of small-bowel obstruction
and possibly normal.

Moderate stool burden within the range of normal limits.
